# Patient Record
Sex: Male | Born: 1990
Health system: Southern US, Community
[De-identification: ages and names within clinical notes are randomized; demographics above are authoritative.]

## PROBLEM LIST (undated history)

## (undated) DIAGNOSIS — F1910 Other psychoactive substance abuse, uncomplicated: Secondary | ICD-10-CM

## (undated) DIAGNOSIS — E785 Hyperlipidemia, unspecified: Secondary | ICD-10-CM

## (undated) DIAGNOSIS — K219 Gastro-esophageal reflux disease without esophagitis: Secondary | ICD-10-CM

## (undated) DIAGNOSIS — N3090 Cystitis, unspecified without hematuria: Secondary | ICD-10-CM

## (undated) DIAGNOSIS — T7840XA Allergy, unspecified, initial encounter: Secondary | ICD-10-CM

## (undated) DIAGNOSIS — J45909 Unspecified asthma, uncomplicated: Secondary | ICD-10-CM

## (undated) DIAGNOSIS — K859 Acute pancreatitis without necrosis or infection, unspecified: Secondary | ICD-10-CM

## (undated) DIAGNOSIS — F1010 Alcohol abuse, uncomplicated: Secondary | ICD-10-CM

## (undated) DIAGNOSIS — L209 Atopic dermatitis, unspecified: Secondary | ICD-10-CM

## (undated) HISTORY — DX: Hyperlipidemia, unspecified: E78.5

## (undated) HISTORY — DX: Unspecified asthma, uncomplicated: J45.909

## (undated) HISTORY — DX: Gastro-esophageal reflux disease without esophagitis: K21.9

## (undated) HISTORY — DX: Atopic dermatitis, unspecified: L20.9

## (undated) HISTORY — DX: Allergy, unspecified, initial encounter: T78.40XA

---
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
## 2012-04-28 ENCOUNTER — Ambulatory Visit (INDEPENDENT_AMBULATORY_CARE_PROVIDER_SITE_OTHER): Payer: Federal, State, Local not specified - PPO | Admitting: Family Medicine

## 2012-04-28 VITALS — BP 128/84 | HR 64 | Temp 98.1°F | Resp 16 | Ht 69.5 in | Wt 143.6 lb

## 2012-04-28 DIAGNOSIS — L259 Unspecified contact dermatitis, unspecified cause: Secondary | ICD-10-CM

## 2012-04-28 DIAGNOSIS — L309 Dermatitis, unspecified: Secondary | ICD-10-CM

## 2012-04-28 MED ORDER — KETOCONAZOLE 2 % EX CREA: TOPICAL_CREAM | Freq: Every day | CUTANEOUS | Status: AC

## 2012-04-28 MED ORDER — DOXYCYCLINE HYCLATE 100 MG PO CAPS: 100.0000 mg | ORAL_CAPSULE | Freq: Two times a day (BID) | ORAL | Status: AC

## 2012-04-28 NOTE — Progress Notes (Signed)
  Subjective:    Patient ID: Charles Dougherty, male    DOB: [birth year], 21 y.o.   MRN: [id number]  HPI This 21 y.o. Male presents for itchy lesion x 1 week. He thinks it was a spider, but did not witness it, just noticed it one morning upon waking.  Has formed a ring, with discoloration in the center.  Still very itchy.  No pain.  Thinks it's getting worse with OTC calamine lotion and anti-itch cream.   Past Medical History  Diagnosis Date  . Allergy   . Asthma   . Atopic eczema     Prior to Admission medications   Medication Sig Start Date End Date Taking? Authorizing Provider  cetirizine (ZYRTEC ALLERGY) 10 MG tablet Take 10 mg by mouth daily.   Yes Historical Provider, MD   No Known Allergies  History   Social History  . Marital Status: Single    Spouse Name: N/A    Number of Children: 0  . Years of Education: N/A   Occupational History  . student     [employer]   Social History Main Topics  . Smoking status: Never Smoker   . Smokeless tobacco: Never Used  . Alcohol Use: No  . Drug Use: No  . Sexually Active: No   Family History  Problem Relation Age of Onset  . Heart disease Mother   . Hypertension Mother   . Nephrolithiasis Father   . GER disease Brother    Review of Systems As above. No fever, chills, GI/GU symptoms.    Objective:   Physical Exam  Constitutional: He is oriented to person, place, and time. Vital signs are normal. He appears well-developed and well-nourished. No distress.  Eyes: Conjunctivae are normal.  Pulmonary/Chest: Effort normal.  Neurological: He is alert and oriented to person, place, and time.  Skin: Skin is warm and dry. Rash noted. Rash is papular. There is erythema.     Psychiatric: He has a normal mood and affect.       Assessment & Plan:   1. Dermatitis  doxycycline (VIBRAMYCIN) 100 MG capsule, ketoconazole (NIZORAL) 2 % cream   Patient Instructions  The cream is to treat you for a  fungal infection, the oral medication for a possible bacterial infection.  If your symptoms worsen or persist, please return for re-evaluation.   Seen with Dr. Patsy Lager.

## 2012-04-28 NOTE — Patient Instructions (Signed)
The cream is to treat you for a fungal infection, the oral medication for a possible bacterial infection.  If your symptoms worsen or persist, please return for re-evaluation.

## 2016-04-01 DIAGNOSIS — K529 Noninfective gastroenteritis and colitis, unspecified: Secondary | ICD-10-CM | POA: Diagnosis not present

## 2016-04-01 DIAGNOSIS — J45909 Unspecified asthma, uncomplicated: Secondary | ICD-10-CM | POA: Diagnosis not present

## 2016-04-01 DIAGNOSIS — J209 Acute bronchitis, unspecified: Secondary | ICD-10-CM | POA: Diagnosis not present

## 2016-12-06 DIAGNOSIS — J111 Influenza due to unidentified influenza virus with other respiratory manifestations: Secondary | ICD-10-CM | POA: Diagnosis not present

## 2017-01-02 ENCOUNTER — Ambulatory Visit (INDEPENDENT_AMBULATORY_CARE_PROVIDER_SITE_OTHER): Payer: Federal, State, Local not specified - PPO

## 2017-01-02 ENCOUNTER — Ambulatory Visit (INDEPENDENT_AMBULATORY_CARE_PROVIDER_SITE_OTHER): Payer: Federal, State, Local not specified - PPO | Admitting: Physician Assistant

## 2017-01-02 VITALS — BP 116/90 | HR 68 | Temp 98.3°F | Resp 16 | Ht 69.0 in | Wt 157.0 lb

## 2017-01-02 DIAGNOSIS — T1490XA Injury, unspecified, initial encounter: Secondary | ICD-10-CM | POA: Diagnosis not present

## 2017-01-02 DIAGNOSIS — M25511 Pain in right shoulder: Secondary | ICD-10-CM

## 2017-01-02 MED ORDER — MELOXICAM 15 MG PO TABS: 15.0000 mg | ORAL_TABLET | Freq: Every day | ORAL | 0 refills | Status: DC

## 2017-01-02 NOTE — Patient Instructions (Addendum)
  Were your sling for one week during the waking hours.  Take meloxicam daily in the morning with food. Take tylneol 1000 mg every 8 as needed.  Do not take other pain meds.    IF you received an x-ray today, you will receive an invoice [facility]. Please contact [facility] at [phone] with questions or concerns regarding your invoice.   IF you received labwork today, you will receive an invoice from [facility]. Please contact [facility] at [phone] with questions or concerns regarding your invoice.   Our billing staff will not be able to assist you with questions regarding bills from these companies.  You will be contacted with the lab results as soon as they are available. The fastest way to get your results is to activate your My Chart account. Instructions are located on the last page of this paperwork. If you have not heard from us regarding the results in 2 weeks, please contact this office.

## 2017-01-02 NOTE — Progress Notes (Signed)
  01/02/2017 4:05 PM   DOB: 09/10/1991 / MRN: [id number]  SUBJECTIVE:  Charles Dougherty is a 26 y.o. male presenting for right shoulder pain that started after he fell off of his hammock. He points to the right supraspinatus when identifying where the pain is.  Tells me the pain radiates to the front of the shoulder. Denies weakness.  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 He associates some bruising.  He has tried Ibuprofen 600-800 every 12 hours with some relief of the pain. He is not getting better or worse.   He has No Known Allergies.   He  has a past medical history of Allergy; Asthma; and Atopic eczema.    He  reports that he has never smoked. He has never used smokeless tobacco. He reports that he does not drink alcohol or use drugs. He  reports that he does not engage in sexual activity. The patient  has no past surgical history on file.  His family history includes GER disease in his brother; Hyperlipidemia in his mother; Hypertension in his mother; Nephrolithiasis in his father.  Review of Systems  Musculoskeletal: Positive for falls, joint pain and myalgias. Negative for back pain and neck pain.  Neurological: Negative for focal weakness.    The problem list and medications were reviewed and updated by myself where necessary and exist elsewhere in the encounter.   OBJECTIVE:  BP 116/90   Pulse 68   Temp 98.3 F (36.8 C) (Oral)   Resp 16   Ht 5\' 9"  (1.753 m)   Wt 157 lb (71.2 kg)   SpO2 98%   BMI 23.18 kg/m   Physical Exam  Constitutional: Vital signs are normal.  Musculoskeletal:       Right shoulder: He exhibits tenderness (AC joint and supraspinatus tendon.), bony tenderness and pain. He exhibits normal range of motion, no swelling, no effusion, no deformity, no laceration, no spasm and normal strength.       Arms: Vitals reviewed.   No results found for this or any previous visit (from the past 72 hour(s)).  Dg Shoulder Right  Result  Date: 01/02/2017 CLINICAL DATA:  Recent fall, right shoulder pain and bruising EXAM: RIGHT SHOULDER - 2+ VIEW COMPARISON:  None available FINDINGS: There is no evidence of fracture or dislocation. There is no evidence of arthropathy or other focal bone abnormality. Soft tissues are unremarkable. IMPRESSION: Negative. Electronically Signed   By: Judie PetitM.  Shick M.D.   On: 01/02/2017 16:01    ASSESSMENT AND PLAN:  Charles CoderZachary was seen today for shoulder.  Diagnoses and all orders for this visit:  Soft tissue injury: Rads negative.  Sling for one week with Meloxicam daily then recheck. Back to work but he must wear his sling.    Acute pain of right shoulder -     DG Shoulder Right; Future -     meloxicam (MOBIC) 15 MG tablet; Take 1 tablet (15 mg total) by mouth daily.    The patient is advised to call or return to clinic if he does not see an improvement in symptoms, or to seek the care of the closest emergency department if he worsens with the above plan.   Deliah BostonMichael Shequila Neglia, MHS, PA-C [facility] 01/02/2017 4:05 PM

## 2017-01-08 ENCOUNTER — Encounter: Payer: Self-pay | Admitting: Physician Assistant

## 2017-01-08 ENCOUNTER — Ambulatory Visit (INDEPENDENT_AMBULATORY_CARE_PROVIDER_SITE_OTHER): Payer: Federal, State, Local not specified - PPO | Admitting: Physician Assistant

## 2017-01-08 VITALS — BP 137/90 | HR 109 | Temp 99.1°F | Resp 16 | Ht 69.0 in | Wt 154.4 lb

## 2017-01-08 DIAGNOSIS — M25511 Pain in right shoulder: Secondary | ICD-10-CM | POA: Diagnosis not present

## 2017-01-08 DIAGNOSIS — T1490XA Injury, unspecified, initial encounter: Secondary | ICD-10-CM

## 2017-01-08 NOTE — Progress Notes (Signed)
  01/08/2017 2:46 PM   DOB: 11/29/1990 / MRN: [id number]  SUBJECTIVE:  Charles Dougherty is a 26 y.o. male presenting for recheck of left arm. Tells me it is 50% percent better.  Less pain with shoulder flexion.  Has been wearing the sling and taking meloxicam and will take Ibuprofen 400 mg.  Tells me his work is cooperating with his injury.   He has No Known Allergies.   He  has a past medical history of Allergy; Asthma; and Atopic eczema.    He  reports that he has never smoked. He has never used smokeless tobacco. He reports that he does not drink alcohol or use drugs. He  reports that he does not engage in sexual activity. The patient  has no past surgical history on file.  His family history includes GER disease in his brother; Hyperlipidemia in his mother; Hypertension in his mother; Nephrolithiasis in his father.  Review of Systems  Constitutional: Negative for chills and fever.    The problem list and medications were reviewed and updated by myself where necessary and exist elsewhere in the encounter.   OBJECTIVE:  BP 137/90 (BP Location: Right Arm, Patient Position: Sitting, Cuff Size: Small)   Pulse (!) 109   Temp 99.1 F (37.3 C) (Oral)   Resp 16   Ht 5\' 9"  (1.753 m)   Wt 154 lb 6.4 oz (70 kg)   SpO2 98%   BMI 22.80 kg/m   BP Readings from Last 3 Encounters:  01/08/17 137/90  01/02/17 116/90  04/28/12 128/84     Physical Exam  Cardiovascular: Normal rate and regular rhythm.   Rate 94 on recheck.   Pulmonary/Chest: Effort normal and breath sounds normal.  Musculoskeletal: Normal range of motion. He exhibits no edema, tenderness or deformity.       Right shoulder: He exhibits pain. He exhibits normal range of motion, no tenderness, no bony tenderness, no swelling, no effusion, no crepitus, no deformity, no laceration, no spasm, normal pulse and normal strength.       Arms:   No results found for this or any previous visit (from the past 72 hour(s)).  No results  found.  ASSESSMENT AND PLAN:  Charles Dougherty was seen today for follow-up.  Diagnoses and all orders for this visit:  Soft tissue injury: Old problem. He will pursue gentle ROM exercise for the next week.  Continue sling at work. Continue meloxicam or ibuprofen.  He will come back in a week if not closer to 75-85% improved.  No need to come back if improved to that point of past that point.   Acute pain of right shoulder: See problem 1.     The patient is advised to call or return to clinic if he does not see an improvement in symptoms, or to seek the care of the closest emergency department if he worsens with the above plan.   Deliah BostonMichael Shahara Hartsfield, MHS, PA-C [facility] 01/08/2017 2:46 PM

## 2017-01-08 NOTE — Patient Instructions (Addendum)
     IF you received an x-ray today, you will receive an invoice from [facility]. Please contact [facility] at [phone] with questions or concerns regarding your invoice.   IF you received labwork today, you will receive an invoice from [facility]. Please contact [facility] at [phone] with questions or concerns regarding your invoice.   Our billing staff will not be able to assist you with questions regarding bills from these companies.  You will be contacted with the lab results as soon as they are available. The fastest way to get your results is to activate your My Chart account. Instructions are located on the last page of this paperwork. If you have not heard from us regarding the results in 2 weeks, please contact this office.     

## 2017-01-09 ENCOUNTER — Ambulatory Visit: Payer: Federal, State, Local not specified - PPO

## 2017-11-22 DIAGNOSIS — J069 Acute upper respiratory infection, unspecified: Secondary | ICD-10-CM | POA: Diagnosis not present

## 2018-02-13 DIAGNOSIS — L03115 Cellulitis of right lower limb: Secondary | ICD-10-CM | POA: Diagnosis not present

## 2018-02-13 DIAGNOSIS — S70361A Insect bite (nonvenomous), right thigh, initial encounter: Secondary | ICD-10-CM | POA: Diagnosis not present

## 2018-08-13 ENCOUNTER — Ambulatory Visit: Payer: Self-pay | Admitting: Adult Health

## 2018-12-27 ENCOUNTER — Emergency Department (HOSPITAL_BASED_OUTPATIENT_CLINIC_OR_DEPARTMENT_OTHER): Payer: Federal, State, Local not specified - PPO

## 2018-12-27 ENCOUNTER — Encounter (HOSPITAL_BASED_OUTPATIENT_CLINIC_OR_DEPARTMENT_OTHER): Payer: Self-pay

## 2018-12-27 ENCOUNTER — Other Ambulatory Visit: Payer: Self-pay

## 2018-12-27 ENCOUNTER — Inpatient Hospital Stay (HOSPITAL_BASED_OUTPATIENT_CLINIC_OR_DEPARTMENT_OTHER)
Admission: EM | Admit: 2018-12-27 | Discharge: 2019-01-19 | DRG: 438 | Disposition: A | Discharge status: Home / Self Care | Payer: Federal, State, Local not specified - PPO | Attending: Family Medicine | Admitting: Family Medicine

## 2018-12-27 DIAGNOSIS — R0902 Hypoxemia: Secondary | ICD-10-CM | POA: Diagnosis not present

## 2018-12-27 DIAGNOSIS — R748 Abnormal levels of other serum enzymes: Secondary | ICD-10-CM

## 2018-12-27 DIAGNOSIS — K8591 Acute pancreatitis with uninfected necrosis, unspecified: Secondary | ICD-10-CM | POA: Clinically undetermined

## 2018-12-27 DIAGNOSIS — E781 Pure hyperglyceridemia: Secondary | ICD-10-CM | POA: Diagnosis not present

## 2018-12-27 DIAGNOSIS — E875 Hyperkalemia: Secondary | ICD-10-CM | POA: Diagnosis not present

## 2018-12-27 DIAGNOSIS — D72829 Elevated white blood cell count, unspecified: Secondary | ICD-10-CM

## 2018-12-27 DIAGNOSIS — E876 Hypokalemia: Secondary | ICD-10-CM | POA: Diagnosis not present

## 2018-12-27 DIAGNOSIS — D638 Anemia in other chronic diseases classified elsewhere: Secondary | ICD-10-CM | POA: Diagnosis present

## 2018-12-27 DIAGNOSIS — R0600 Dyspnea, unspecified: Secondary | ICD-10-CM

## 2018-12-27 DIAGNOSIS — Z9889 Other specified postprocedural states: Secondary | ICD-10-CM

## 2018-12-27 DIAGNOSIS — D649 Anemia, unspecified: Secondary | ICD-10-CM | POA: Diagnosis not present

## 2018-12-27 DIAGNOSIS — F17210 Nicotine dependence, cigarettes, uncomplicated: Secondary | ICD-10-CM | POA: Diagnosis present

## 2018-12-27 DIAGNOSIS — J189 Pneumonia, unspecified organism: Secondary | ICD-10-CM | POA: Diagnosis not present

## 2018-12-27 DIAGNOSIS — K852 Alcohol induced acute pancreatitis without necrosis or infection: Secondary | ICD-10-CM | POA: Diagnosis not present

## 2018-12-27 DIAGNOSIS — Z791 Long term (current) use of non-steroidal anti-inflammatories (NSAID): Secondary | ICD-10-CM

## 2018-12-27 DIAGNOSIS — I9589 Other hypotension: Secondary | ICD-10-CM | POA: Diagnosis not present

## 2018-12-27 DIAGNOSIS — R1011 Right upper quadrant pain: Secondary | ICD-10-CM

## 2018-12-27 DIAGNOSIS — E871 Hypo-osmolality and hyponatremia: Secondary | ICD-10-CM | POA: Diagnosis not present

## 2018-12-27 DIAGNOSIS — R197 Diarrhea, unspecified: Secondary | ICD-10-CM | POA: Diagnosis not present

## 2018-12-27 DIAGNOSIS — R0602 Shortness of breath: Secondary | ICD-10-CM

## 2018-12-27 DIAGNOSIS — K760 Fatty (change of) liver, not elsewhere classified: Secondary | ICD-10-CM | POA: Diagnosis present

## 2018-12-27 DIAGNOSIS — F1910 Other psychoactive substance abuse, uncomplicated: Secondary | ICD-10-CM | POA: Diagnosis present

## 2018-12-27 DIAGNOSIS — G4700 Insomnia, unspecified: Secondary | ICD-10-CM | POA: Diagnosis not present

## 2018-12-27 DIAGNOSIS — J181 Lobar pneumonia, unspecified organism: Secondary | ICD-10-CM | POA: Diagnosis not present

## 2018-12-27 DIAGNOSIS — I10 Essential (primary) hypertension: Secondary | ICD-10-CM | POA: Diagnosis present

## 2018-12-27 DIAGNOSIS — K56609 Unspecified intestinal obstruction, unspecified as to partial versus complete obstruction: Secondary | ICD-10-CM | POA: Diagnosis not present

## 2018-12-27 DIAGNOSIS — K567 Ileus, unspecified: Secondary | ICD-10-CM | POA: Diagnosis not present

## 2018-12-27 DIAGNOSIS — J90 Pleural effusion, not elsewhere classified: Secondary | ICD-10-CM | POA: Diagnosis not present

## 2018-12-27 DIAGNOSIS — J45909 Unspecified asthma, uncomplicated: Secondary | ICD-10-CM | POA: Diagnosis present

## 2018-12-27 DIAGNOSIS — R509 Fever, unspecified: Secondary | ICD-10-CM | POA: Diagnosis not present

## 2018-12-27 DIAGNOSIS — Z452 Encounter for adjustment and management of vascular access device: Secondary | ICD-10-CM

## 2018-12-27 DIAGNOSIS — N3090 Cystitis, unspecified without hematuria: Secondary | ICD-10-CM | POA: Diagnosis present

## 2018-12-27 DIAGNOSIS — K625 Hemorrhage of anus and rectum: Secondary | ICD-10-CM | POA: Diagnosis not present

## 2018-12-27 DIAGNOSIS — F1210 Cannabis abuse, uncomplicated: Secondary | ICD-10-CM | POA: Diagnosis present

## 2018-12-27 DIAGNOSIS — R1010 Upper abdominal pain, unspecified: Secondary | ICD-10-CM | POA: Diagnosis not present

## 2018-12-27 DIAGNOSIS — R109 Unspecified abdominal pain: Secondary | ICD-10-CM

## 2018-12-27 DIAGNOSIS — K921 Melena: Secondary | ICD-10-CM | POA: Diagnosis not present

## 2018-12-27 DIAGNOSIS — E162 Hypoglycemia, unspecified: Secondary | ICD-10-CM | POA: Diagnosis not present

## 2018-12-27 DIAGNOSIS — K8521 Alcohol induced acute pancreatitis with uninfected necrosis: Principal | ICD-10-CM | POA: Diagnosis present

## 2018-12-27 DIAGNOSIS — R112 Nausea with vomiting, unspecified: Secondary | ICD-10-CM | POA: Diagnosis not present

## 2018-12-27 DIAGNOSIS — N179 Acute kidney failure, unspecified: Secondary | ICD-10-CM | POA: Diagnosis not present

## 2018-12-27 DIAGNOSIS — K8581 Other acute pancreatitis with uninfected necrosis: Secondary | ICD-10-CM | POA: Diagnosis present

## 2018-12-27 DIAGNOSIS — Z79899 Other long term (current) drug therapy: Secondary | ICD-10-CM

## 2018-12-27 DIAGNOSIS — R1084 Generalized abdominal pain: Secondary | ICD-10-CM | POA: Diagnosis not present

## 2018-12-27 DIAGNOSIS — K859 Acute pancreatitis without necrosis or infection, unspecified: Secondary | ICD-10-CM

## 2018-12-27 DIAGNOSIS — R1110 Vomiting, unspecified: Secondary | ICD-10-CM | POA: Diagnosis not present

## 2018-12-27 DIAGNOSIS — Z8349 Family history of other endocrine, nutritional and metabolic diseases: Secondary | ICD-10-CM

## 2018-12-27 DIAGNOSIS — F1010 Alcohol abuse, uncomplicated: Secondary | ICD-10-CM | POA: Diagnosis not present

## 2018-12-27 DIAGNOSIS — Z8249 Family history of ischemic heart disease and other diseases of the circulatory system: Secondary | ICD-10-CM

## 2018-12-27 DIAGNOSIS — Z978 Presence of other specified devices: Secondary | ICD-10-CM

## 2018-12-27 DIAGNOSIS — R079 Chest pain, unspecified: Secondary | ICD-10-CM

## 2018-12-27 DIAGNOSIS — K644 Residual hemorrhoidal skin tags: Secondary | ICD-10-CM | POA: Diagnosis present

## 2018-12-27 DIAGNOSIS — Z0189 Encounter for other specified special examinations: Secondary | ICD-10-CM

## 2018-12-27 DIAGNOSIS — R21 Rash and other nonspecific skin eruption: Secondary | ICD-10-CM | POA: Diagnosis not present

## 2018-12-27 DIAGNOSIS — Z91013 Allergy to seafood: Secondary | ICD-10-CM

## 2018-12-27 DIAGNOSIS — R140 Abdominal distension (gaseous): Secondary | ICD-10-CM

## 2018-12-27 DIAGNOSIS — E46 Unspecified protein-calorie malnutrition: Secondary | ICD-10-CM | POA: Diagnosis not present

## 2018-12-27 DIAGNOSIS — D6959 Other secondary thrombocytopenia: Secondary | ICD-10-CM | POA: Diagnosis present

## 2018-12-27 DIAGNOSIS — Z6822 Body mass index (BMI) 22.0-22.9, adult: Secondary | ICD-10-CM

## 2018-12-27 HISTORY — DX: Other psychoactive substance abuse, uncomplicated: F19.10

## 2018-12-27 HISTORY — DX: Cystitis, unspecified without hematuria: N30.90

## 2018-12-27 HISTORY — DX: Alcohol abuse, uncomplicated: F10.10

## 2018-12-27 LAB — URINALYSIS, ROUTINE W REFLEX MICROSCOPIC
Bilirubin Urine: NEGATIVE
Glucose, UA: NEGATIVE mg/dL
Hgb urine dipstick: NEGATIVE
KETONES UR: NEGATIVE mg/dL
Leukocytes,Ua: NEGATIVE
Nitrite: NEGATIVE
Protein, ur: NEGATIVE mg/dL
Specific Gravity, Urine: 1.01 (ref 1.005–1.030)
pH: 6 (ref 5.0–8.0)

## 2018-12-27 LAB — CBC
HCT: 39.6 % (ref 39.0–52.0)
Hemoglobin: 14.4 g/dL (ref 13.0–17.0)
MCH: 35 pg — ABNORMAL HIGH (ref 26.0–34.0)
MCHC: 36.4 g/dL — ABNORMAL HIGH (ref 30.0–36.0)
MCV: 96.1 fL (ref 80.0–100.0)
Platelets: 103 10*3/uL — ABNORMAL LOW (ref 150–400)
RBC: 4.12 MIL/uL — ABNORMAL LOW (ref 4.22–5.81)
RDW: 13.2 % (ref 11.5–15.5)
WBC: 10.2 10*3/uL (ref 4.0–10.5)
nRBC: 0 % (ref 0.0–0.2)

## 2018-12-27 MED ORDER — ONDANSETRON HCL 4 MG/2ML IJ SOLN
4.0000 mg | Freq: Once | INTRAMUSCULAR | Status: AC
  Administered 2018-12-27: 4 mg via INTRAVENOUS
  Filled 2018-12-27: qty 2

## 2018-12-27 MED ORDER — ALUM & MAG HYDROXIDE-SIMETH 200-200-20 MG/5ML PO SUSP
30.0000 mL | Freq: Once | ORAL | Status: AC
  Administered 2018-12-27: 30 mL via ORAL
  Filled 2018-12-27: qty 30

## 2018-12-27 MED ORDER — SODIUM CHLORIDE 0.9 % IV BOLUS
1000.0000 mL | Freq: Once | INTRAVENOUS | Status: AC
  Administered 2018-12-27: 1000 mL via INTRAVENOUS

## 2018-12-27 MED ORDER — HYDROMORPHONE HCL 1 MG/ML IJ SOLN
1.0000 mg | Freq: Once | INTRAMUSCULAR | Status: AC
  Administered 2018-12-27: 1 mg via INTRAVENOUS
  Filled 2018-12-27: qty 1

## 2018-12-27 MED ORDER — LIDOCAINE VISCOUS HCL 2 % MT SOLN
15.0000 mL | Freq: Once | OROMUCOSAL | Status: AC
  Administered 2018-12-27: 15 mL via ORAL
  Filled 2018-12-27: qty 15

## 2018-12-27 MED ORDER — MORPHINE SULFATE (PF) 4 MG/ML IV SOLN
4.0000 mg | Freq: Once | INTRAVENOUS | Status: AC
  Administered 2018-12-27: 4 mg via INTRAVENOUS
  Filled 2018-12-27: qty 1

## 2018-12-27 MED ORDER — IOHEXOL 300 MG/ML  SOLN
100.0000 mL | Freq: Once | INTRAMUSCULAR | Status: AC | PRN
  Administered 2018-12-27: 100 mL via INTRAVENOUS

## 2018-12-27 NOTE — ED Notes (Signed)
ED Provider at bedside. 

## 2018-12-27 NOTE — ED Notes (Signed)
Pt yelling in pain

## 2018-12-27 NOTE — ED Provider Notes (Signed)
[facility] EMERGENCY DEPARTMENT Provider Note   CSN: [id number] Arrival date & time: 12/27/18  2132  History   Chief Complaint Chief Complaint  Patient presents with  . Abdominal Pain   HPI Charles Dougherty is a 28 y.o. male with no significant past medical history who presents for evaluation of abdominal pain.  Patient states he has had epigastric and right upper quadrant abdominal pain x months. Worse over the last 2 days.  Patient states he has had multiple episodes of nonbloody, nonbilious emesis.  Has not taken anything for his symptoms.  Denies NSAID use, tobacco, alcohol use.  Patient states symptoms worse after eating.  Patient states he is also had some bright red blood with wiping with rectal movements.  Patient has known history of external hemorrhoids.  States this is consistent.  Rates his pain an 8/10.  Pain does not radiate.  Denies any additional aggravating or alleviating factors.  Denies fever, chills, chest pain, diarrhea, dysuria.  She states he has felt short of breath when he gets "waves of pain." Denies tobacco or alcohol use.  History obtained from patient and mother in room.  No interpreter was used.    HPI  Past Medical History:  Diagnosis Date  . Allergy   . Asthma   . Atopic eczema     There are no active problems to display for this patient.   History reviewed. No pertinent surgical history.      Home Medications    Prior to Admission medications   Medication Sig Start Date End Date Taking? Authorizing Provider  cetirizine (ZYRTEC ALLERGY) 10 MG tablet Take 10 mg by mouth daily.    [provider]  meloxicam (MOBIC) 15 MG tablet Take 1 tablet (15 mg total) by mouth daily. 01/02/17   Ofilia Neas, PA-C    Family History Family History  Problem Relation Age of Onset  . Hypertension Mother   . Hyperlipidemia Mother   . Nephrolithiasis Father   . GER disease Brother     Social History Social History   Tobacco Use  .  Smoking status: Current Every Day Smoker    Types: Cigarettes  . Smokeless tobacco: Never Used  Substance Use Topics  . Alcohol use: Yes    Comment: weekly  . Drug use: Yes    Types: Marijuana     Allergies   Patient has no known allergies.   Review of Systems Review of Systems  Constitutional: Negative.   HENT: Negative.   Eyes: Negative.   Respiratory: Positive for shortness of breath. Negative for apnea, cough, choking, chest tightness, wheezing and stridor.   Gastrointestinal: Positive for abdominal pain, blood in stool, nausea and vomiting. Negative for abdominal distention, anal bleeding, constipation, diarrhea and rectal pain.  Genitourinary: Negative.   Musculoskeletal: Negative.   Skin: Negative.   Neurological: Negative.   All other systems reviewed and are negative.    Physical Exam Updated Vital Signs BP (!) 142/81   Pulse 84   Temp 97.8 F (36.6 C) (Oral)   Resp (!) 22   Ht  (1.778 m)   Wt 70.3 kg   SpO2 100%   BMI 22.24 kg/m   Physical Exam Vitals signs and nursing note reviewed.  Constitutional:      General: He is not in acute distress.    Appearance: He is well-developed. He is not ill-appearing, toxic-appearing or diaphoretic.  HENT:     Head: Normocephalic and atraumatic.  Mouth/Throat:     Comments: Mucous membranes moist. Eyes:     Pupils: Pupils are equal, round, and reactive to light.  Neck:     Musculoskeletal: Normal range of motion and neck supple.  Cardiovascular:     Rate and Rhythm: Normal rate and regular rhythm.     Heart sounds: Normal heart sounds.  Pulmonary:     Effort: Pulmonary effort is normal. No respiratory distress.     Comments: Clear to auscultation bilateral without wheeze, rhonchi or rales.  No accessory muscle usage.  Able speak in full sentences without difficulty. Abdominal:     General: There is no distension.     Palpations: Abdomen is soft.     Comments: Soft without rebound. Guarding however  distractible in nature.  Diffusely tender, more so in epigastric and right upper quadrant.  No CVA tenderness.  Musculoskeletal: Normal range of motion.     Comments: Moves all 4 extremities without difficulty.  Ambulatory in department that difficulty.  Skin:    General: Skin is warm and dry.     Comments: No rashes or lesions.  Brisk capillary refill.  Neurological:     Mental Status: He is alert.      ED Treatments / Results  Labs (all labs ordered are listed, but only abnormal results are displayed) Labs Reviewed  LIPASE, BLOOD - Abnormal; Notable for the following components:      Result Value   Lipase 209 (*)    All other components within normal limits  CBC - Abnormal; Notable for the following components:   RBC 4.12 (*)    MCH 35.0 (*)    MCHC 36.4 (*)    Platelets 103 (*)    All other components within normal limits  URINALYSIS, ROUTINE W REFLEX MICROSCOPIC  COMPREHENSIVE METABOLIC PANEL    EKG None  Radiology US Abdomen Limited Ruq  Result Date: 12/27/2018 CLINICAL DATA:  28 year old male with right upper quadrant abdominal pain for 2 months, but progressed in the past 2 days. EXAM: ULTRASOUND ABDOMEN LIMITED RIGHT UPPER QUADRANT COMPARISON:  None. FINDINGS: Gallbladder: No gallstones or wall thickening visualized. No sonographic Murphy sign noted by sonographer. Common bile duct: Diameter: 3 millimeters, normal. Liver: Echogenic liver (image 15). No discrete liver lesion. No intrahepatic biliary ductal dilatation. Portal vein is patent on color Doppler imaging with normal direction of blood flow towards the liver. Other findings: Negative visible right kidney. IMPRESSION: 1. Hepatic steatosis. 2. Negative gallbladder.  No evidence of biliary obstruction. Electronically Signed   By: Odessa Fleming M.D.   On: 12/27/2018 23:26   Procedures Procedures (including critical care time)  Medications Ordered in ED Medications  sodium chloride 0.9 % bolus 1,000 mL (1,000 mLs  Intravenous New Bag/Given 12/27/18 2255)  ondansetron (ZOFRAN) injection 4 mg (4 mg Intravenous Given 12/27/18 2254)  morphine 4 MG/ML injection 4 mg (4 mg Intravenous Given 12/27/18 2254)  alum & mag hydroxide-simeth (MAALOX/MYLANTA) 200-200-20 MG/5ML suspension 30 mL (30 mLs Oral Given 12/27/18 2347)    And  lidocaine (XYLOCAINE) 2 % viscous mouth solution 15 mL (15 mLs Oral Given 12/27/18 2347)  HYDROmorphone (DILAUDID) injection 1 mg (1 mg Intravenous Given 12/27/18 2347)  iohexol (OMNIPAQUE) 300 MG/ML solution 100 mL (100 mLs Intravenous Contrast Given 12/27/18 2350)   Initial Impression / Assessment and Plan / ED Course  I have reviewed the triage vital signs and the nursing notes.  Pertinent labs & imaging results that were available during my care of the  patient were reviewed by me and considered in my medical decision making (see chart for details).  28 year old male appears otherwise well presents for evaluation of abdominal pain.  Onset 2 months ago, worsened over the last 3 days.  Afebrile, nonseptic, non-ill-appearing.  Patient with diffusely tender abdomen, worse in epigastric and right upper quadrant.  Mucous membranes moist.  Lungs clear to auscultation bilaterally without wheeze, rhonchi or rales.  No tachypnea, tachycardia or hypoxia.  Will obtain labs, imaging and reevaluate. Occult positive at [facility] with hemorrhoids. Discussed repeating this. Declines repeat GU exam at this time. I cannot see his results from [facility] minute clinic in Epic.  2330: CBC without leukocytosis, urinalysis negative, metabolic panel lipase pending.  I was notified me that blood is extremely lipophilic.  Will take additional time for metabolic panel to result.  Ultrasound abdomen without any evidence of gallbladder pathology. Lipase 209.  Patient pending CMP and CT at care transfer Care transferred to Dr. Manus Gunning who will determine ultimate treatment, plan and disposition.     Final Clinical Impressions(s) / ED  Diagnoses   Final diagnoses:  RUQ pain    ED Discharge Orders    None       Maytal Mijangos A, PA-C 12/28/18 0019    Glynn Octave, MD 12/28/18 0139

## 2018-12-27 NOTE — ED Triage Notes (Signed)
C/o abd pain x 2 months-dizziness, SOB started yesterday-blood in stool x today-seen at [facility]-states pain has increased and he was unable to get outpt lab done before closing today-NAD-steady gait-mother with pt

## 2018-12-27 NOTE — ED Notes (Signed)
PT writhing in pain, states pain is sharp now.

## 2018-12-27 NOTE — ED Notes (Signed)
Patient transported to CT 

## 2018-12-27 NOTE — ED Notes (Signed)
Patient transported to Ultrasound 

## 2018-12-28 ENCOUNTER — Encounter (HOSPITAL_COMMUNITY): Payer: Self-pay | Admitting: *Deleted

## 2018-12-28 ENCOUNTER — Other Ambulatory Visit: Payer: Self-pay

## 2018-12-28 DIAGNOSIS — Z9889 Other specified postprocedural states: Secondary | ICD-10-CM | POA: Diagnosis not present

## 2018-12-28 DIAGNOSIS — K921 Melena: Secondary | ICD-10-CM | POA: Diagnosis present

## 2018-12-28 DIAGNOSIS — R1110 Vomiting, unspecified: Secondary | ICD-10-CM | POA: Diagnosis not present

## 2018-12-28 DIAGNOSIS — K863 Pseudocyst of pancreas: Secondary | ICD-10-CM | POA: Diagnosis not present

## 2018-12-28 DIAGNOSIS — R1010 Upper abdominal pain, unspecified: Secondary | ICD-10-CM | POA: Diagnosis not present

## 2018-12-28 DIAGNOSIS — K852 Alcohol induced acute pancreatitis without necrosis or infection: Secondary | ICD-10-CM | POA: Diagnosis present

## 2018-12-28 DIAGNOSIS — R918 Other nonspecific abnormal finding of lung field: Secondary | ICD-10-CM | POA: Diagnosis not present

## 2018-12-28 DIAGNOSIS — K8592 Acute pancreatitis with infected necrosis, unspecified: Secondary | ICD-10-CM | POA: Diagnosis not present

## 2018-12-28 DIAGNOSIS — Z4682 Encounter for fitting and adjustment of non-vascular catheter: Secondary | ICD-10-CM | POA: Diagnosis not present

## 2018-12-28 DIAGNOSIS — E876 Hypokalemia: Secondary | ICD-10-CM | POA: Diagnosis not present

## 2018-12-28 DIAGNOSIS — J90 Pleural effusion, not elsewhere classified: Secondary | ICD-10-CM | POA: Diagnosis not present

## 2018-12-28 DIAGNOSIS — N3090 Cystitis, unspecified without hematuria: Secondary | ICD-10-CM

## 2018-12-28 DIAGNOSIS — R140 Abdominal distension (gaseous): Secondary | ICD-10-CM | POA: Diagnosis not present

## 2018-12-28 DIAGNOSIS — F1910 Other psychoactive substance abuse, uncomplicated: Secondary | ICD-10-CM | POA: Diagnosis not present

## 2018-12-28 DIAGNOSIS — J948 Other specified pleural conditions: Secondary | ICD-10-CM | POA: Diagnosis not present

## 2018-12-28 DIAGNOSIS — K859 Acute pancreatitis without necrosis or infection, unspecified: Secondary | ICD-10-CM | POA: Diagnosis not present

## 2018-12-28 DIAGNOSIS — K56609 Unspecified intestinal obstruction, unspecified as to partial versus complete obstruction: Secondary | ICD-10-CM | POA: Diagnosis not present

## 2018-12-28 DIAGNOSIS — J45909 Unspecified asthma, uncomplicated: Secondary | ICD-10-CM | POA: Diagnosis present

## 2018-12-28 DIAGNOSIS — N179 Acute kidney failure, unspecified: Secondary | ICD-10-CM | POA: Diagnosis not present

## 2018-12-28 DIAGNOSIS — J189 Pneumonia, unspecified organism: Secondary | ICD-10-CM | POA: Diagnosis not present

## 2018-12-28 DIAGNOSIS — K56699 Other intestinal obstruction unspecified as to partial versus complete obstruction: Secondary | ICD-10-CM | POA: Diagnosis not present

## 2018-12-28 DIAGNOSIS — E781 Pure hyperglyceridemia: Secondary | ICD-10-CM | POA: Diagnosis not present

## 2018-12-28 DIAGNOSIS — I9589 Other hypotension: Secondary | ICD-10-CM | POA: Diagnosis not present

## 2018-12-28 DIAGNOSIS — F1010 Alcohol abuse, uncomplicated: Secondary | ICD-10-CM | POA: Diagnosis not present

## 2018-12-28 DIAGNOSIS — R748 Abnormal levels of other serum enzymes: Secondary | ICD-10-CM | POA: Diagnosis not present

## 2018-12-28 DIAGNOSIS — K8521 Alcohol induced acute pancreatitis with uninfected necrosis: Secondary | ICD-10-CM | POA: Diagnosis present

## 2018-12-28 DIAGNOSIS — E871 Hypo-osmolality and hyponatremia: Secondary | ICD-10-CM | POA: Diagnosis not present

## 2018-12-28 DIAGNOSIS — D638 Anemia in other chronic diseases classified elsewhere: Secondary | ICD-10-CM | POA: Diagnosis present

## 2018-12-28 DIAGNOSIS — K760 Fatty (change of) liver, not elsewhere classified: Secondary | ICD-10-CM | POA: Diagnosis present

## 2018-12-28 DIAGNOSIS — R1011 Right upper quadrant pain: Secondary | ICD-10-CM | POA: Diagnosis present

## 2018-12-28 DIAGNOSIS — R1084 Generalized abdominal pain: Secondary | ICD-10-CM | POA: Diagnosis not present

## 2018-12-28 DIAGNOSIS — E46 Unspecified protein-calorie malnutrition: Secondary | ICD-10-CM | POA: Diagnosis not present

## 2018-12-28 DIAGNOSIS — R197 Diarrhea, unspecified: Secondary | ICD-10-CM | POA: Diagnosis not present

## 2018-12-28 DIAGNOSIS — J918 Pleural effusion in other conditions classified elsewhere: Secondary | ICD-10-CM | POA: Diagnosis not present

## 2018-12-28 DIAGNOSIS — K8581 Other acute pancreatitis with uninfected necrosis: Secondary | ICD-10-CM | POA: Diagnosis present

## 2018-12-28 DIAGNOSIS — K8591 Acute pancreatitis with uninfected necrosis, unspecified: Secondary | ICD-10-CM | POA: Diagnosis not present

## 2018-12-28 DIAGNOSIS — R079 Chest pain, unspecified: Secondary | ICD-10-CM | POA: Diagnosis not present

## 2018-12-28 DIAGNOSIS — D649 Anemia, unspecified: Secondary | ICD-10-CM | POA: Diagnosis not present

## 2018-12-28 DIAGNOSIS — D6959 Other secondary thrombocytopenia: Secondary | ICD-10-CM | POA: Diagnosis present

## 2018-12-28 DIAGNOSIS — E875 Hyperkalemia: Secondary | ICD-10-CM | POA: Diagnosis not present

## 2018-12-28 DIAGNOSIS — J9811 Atelectasis: Secondary | ICD-10-CM | POA: Diagnosis not present

## 2018-12-28 DIAGNOSIS — Z4659 Encounter for fitting and adjustment of other gastrointestinal appliance and device: Secondary | ICD-10-CM | POA: Diagnosis not present

## 2018-12-28 DIAGNOSIS — K567 Ileus, unspecified: Secondary | ICD-10-CM | POA: Diagnosis not present

## 2018-12-28 DIAGNOSIS — F17210 Nicotine dependence, cigarettes, uncomplicated: Secondary | ICD-10-CM | POA: Diagnosis present

## 2018-12-28 DIAGNOSIS — Z452 Encounter for adjustment and management of vascular access device: Secondary | ICD-10-CM | POA: Diagnosis not present

## 2018-12-28 HISTORY — DX: Cystitis, unspecified without hematuria: N30.90

## 2018-12-28 HISTORY — DX: Alcohol abuse, uncomplicated: F10.10

## 2018-12-28 HISTORY — DX: Other psychoactive substance abuse, uncomplicated: F19.10

## 2018-12-28 LAB — COMPREHENSIVE METABOLIC PANEL
ALT: 117 U/L — ABNORMAL HIGH (ref 0–44)
AST: 105 U/L — ABNORMAL HIGH (ref 15–41)
Albumin: 3.2 g/dL — ABNORMAL LOW (ref 3.5–5.0)
Alkaline Phosphatase: 78 U/L (ref 38–126)
Anion gap: 12 (ref 5–15)
BUN: 6 mg/dL (ref 6–20)
CO2: 16 mmol/L — AB (ref 22–32)
Calcium: 7 mg/dL — ABNORMAL LOW (ref 8.9–10.3)
Chloride: 109 mmol/L (ref 98–111)
Creatinine, Ser: 1.1 mg/dL (ref 0.61–1.24)
GFR calc Af Amer: >60 mL/min (ref >60)
GFR calc non Af Amer: >60 mL/min (ref >60)
Glucose, Bld: 108 mg/dL — ABNORMAL HIGH (ref 70–99)
Potassium: 3.5 mmol/L (ref 3.5–5.1)
SODIUM: 137 mmol/L (ref 135–145)
Total Bilirubin: 1.9 mg/dL — ABNORMAL HIGH (ref 0.3–1.2)
Total Protein: 5.5 g/dL — ABNORMAL LOW (ref 6.5–8.1)

## 2018-12-28 LAB — RAPID URINE DRUG SCREEN, HOSP PERFORMED
Amphetamines: POSITIVE — AB
Barbiturates: NOT DETECTED
Benzodiazepines: NOT DETECTED
Cocaine: NOT DETECTED
Opiates: POSITIVE — AB
Tetrahydrocannabinol: POSITIVE — AB

## 2018-12-28 LAB — COMPREHENSIVE METABOLIC PANEL WITH GFR
ALT: 150 U/L — ABNORMAL HIGH (ref 0–44)
AST: 158 U/L — ABNORMAL HIGH (ref 15–41)
Albumin: 3.8 g/dL (ref 3.5–5.0)
Alkaline Phosphatase: 97 U/L (ref 38–126)
Anion gap: 16 — ABNORMAL HIGH (ref 5–15)
BUN: 14 mg/dL (ref 6–20)
CO2: 18 mmol/L — ABNORMAL LOW (ref 22–32)
Calcium: 8.9 mg/dL (ref 8.9–10.3)
Chloride: 99 mmol/L (ref 98–111)
Creatinine, Ser: 1.34 mg/dL — ABNORMAL HIGH (ref 0.61–1.24)
GFR calc Af Amer: >60 mL/min
GFR calc non Af Amer: >60 mL/min
Glucose, Bld: 99 mg/dL (ref 70–99)
Potassium: 3.5 mmol/L (ref 3.5–5.1)
Sodium: 133 mmol/L — ABNORMAL LOW (ref 135–145)
Total Bilirubin: 1.1 mg/dL (ref 0.3–1.2)
Total Protein: 6.1 g/dL — ABNORMAL LOW (ref 6.5–8.1)

## 2018-12-28 LAB — URINALYSIS, ROUTINE W REFLEX MICROSCOPIC
Bilirubin Urine: NEGATIVE
Glucose, UA: NEGATIVE mg/dL
Hgb urine dipstick: NEGATIVE
Ketones, ur: 5 mg/dL — AB
Leukocytes,Ua: NEGATIVE
Nitrite: NEGATIVE
Protein, ur: NEGATIVE mg/dL
Specific Gravity, Urine: 1.036 — ABNORMAL HIGH (ref 1.005–1.030)
pH: 5 (ref 5.0–8.0)

## 2018-12-28 LAB — CBC
HCT: 43.8 % (ref 39.0–52.0)
Hemoglobin: 14.8 g/dL (ref 13.0–17.0)
MCH: 34.5 pg — ABNORMAL HIGH (ref 26.0–34.0)
MCHC: 33.8 g/dL (ref 30.0–36.0)
MCV: 102.1 fL — ABNORMAL HIGH (ref 80.0–100.0)
NRBC: 0 % (ref 0.0–0.2)
Platelets: 92 10*3/uL — ABNORMAL LOW (ref 150–400)
RBC: 4.29 MIL/uL (ref 4.22–5.81)
RDW: 13.6 % (ref 11.5–15.5)
WBC: 8.6 10*3/uL (ref 4.0–10.5)

## 2018-12-28 LAB — PROTIME-INR
INR: 0.9 (ref 0.8–1.2)
Prothrombin Time: 12.4 seconds (ref 11.4–15.2)

## 2018-12-28 LAB — LIPID PANEL
CHOLESTEROL: 1108 mg/dL — AB (ref 0–200)
HDL: 11 mg/dL — ABNORMAL LOW (ref >40)
LDL Cholesterol: UNDETERMINED mg/dL (ref 0–99)
Triglycerides: >5000 mg/dL — ABNORMAL HIGH (ref <150)
VLDL: UNDETERMINED mg/dL (ref 0–40)

## 2018-12-28 LAB — HEMOGLOBIN A1C
HEMOGLOBIN A1C: 4.9 % (ref 4.8–5.6)
Mean Plasma Glucose: 93.93 mg/dL

## 2018-12-28 LAB — ETHANOL: Alcohol, Ethyl (B): <10 mg/dL (ref <10)

## 2018-12-28 LAB — MAGNESIUM: Magnesium: 1.7 mg/dL (ref 1.7–2.4)

## 2018-12-28 LAB — GAMMA GT: GGT: 1012 U/L — ABNORMAL HIGH (ref 7–50)

## 2018-12-28 LAB — LIPASE, BLOOD
Lipase: 209 U/L — ABNORMAL HIGH (ref 11–51)
Lipase: 840 U/L — ABNORMAL HIGH (ref 11–51)

## 2018-12-28 LAB — HIV ANTIBODY (ROUTINE TESTING W REFLEX): HIV Screen 4th Generation wRfx: NONREACTIVE

## 2018-12-28 LAB — PHOSPHORUS: Phosphorus: 2.9 mg/dL (ref 2.5–4.6)

## 2018-12-28 MED ORDER — ZOLPIDEM TARTRATE 5 MG PO TABS
5.0000 mg | ORAL_TABLET | Freq: Every evening | ORAL | Status: DC | PRN
  Administered 2018-12-28 – 2019-01-05 (×6): 5 mg via ORAL
  Filled 2018-12-28 (×7): qty 1

## 2018-12-28 MED ORDER — SODIUM CHLORIDE 0.9 % IV BOLUS
1000.0000 mL | Freq: Once | INTRAVENOUS | Status: AC
  Administered 2018-12-28: 1000 mL via INTRAVENOUS

## 2018-12-28 MED ORDER — HYDROMORPHONE HCL 1 MG/ML IJ SOLN
1.0000 mg | Freq: Once | INTRAMUSCULAR | Status: AC
  Administered 2018-12-28: 1 mg via INTRAVENOUS
  Filled 2018-12-28: qty 1

## 2018-12-28 MED ORDER — FENTANYL CITRATE (PF) 100 MCG/2ML IJ SOLN
100.0000 ug | Freq: Once | INTRAMUSCULAR | Status: AC
  Administered 2018-12-28: 100 ug via INTRAVENOUS
  Filled 2018-12-28: qty 2

## 2018-12-28 MED ORDER — ENOXAPARIN SODIUM 40 MG/0.4ML ~~LOC~~ SOLN
40.0000 mg | SUBCUTANEOUS | Status: DC
  Administered 2018-12-28 – 2018-12-30 (×3): 40 mg via SUBCUTANEOUS
  Filled 2018-12-28 (×3): qty 0.4

## 2018-12-28 MED ORDER — MORPHINE SULFATE (PF) 2 MG/ML IV SOLN
1.0000 mg | INTRAVENOUS | Status: DC | PRN
  Administered 2018-12-28 – 2018-12-29 (×6): 3 mg via INTRAVENOUS
  Administered 2018-12-29 (×2): 2 mg via INTRAVENOUS
  Administered 2018-12-29 (×2): 3 mg via INTRAVENOUS
  Administered 2018-12-29 – 2018-12-30 (×6): 2 mg via INTRAVENOUS
  Filled 2018-12-28: qty 1
  Filled 2018-12-28: qty 2
  Filled 2018-12-28: qty 1
  Filled 2018-12-28 (×4): qty 2
  Filled 2018-12-28: qty 1
  Filled 2018-12-28 (×4): qty 2
  Filled 2018-12-28 (×4): qty 1

## 2018-12-28 MED ORDER — ONDANSETRON HCL 4 MG PO TABS: 4.0000 mg | ORAL_TABLET | Freq: Four times a day (QID) | ORAL | Status: DC | PRN

## 2018-12-28 MED ORDER — SODIUM CHLORIDE 0.9 % IV SOLN
INTRAVENOUS | Status: DC
  Administered 2018-12-28 – 2018-12-30 (×8): via INTRAVENOUS

## 2018-12-28 MED ORDER — ONDANSETRON HCL 4 MG/2ML IJ SOLN
4.0000 mg | Freq: Four times a day (QID) | INTRAMUSCULAR | Status: DC | PRN
  Administered 2018-12-28 – 2019-01-08 (×9): 4 mg via INTRAVENOUS
  Filled 2018-12-28 (×11): qty 2

## 2018-12-28 NOTE — Progress Notes (Signed)
Patient is a 28 year old male with history of alcohol abuse, substance abuse, smoker who presents to the emergency department with complaints of abdominal pain.  Admitted for the management of acute pancreatitis related to alcohol. Patient seen and examined the bedside this morning.  Currently hemodynamically stable.  Abdomen pain has improved but he still has persistent nausea. Continue IV fluids, antiemetics. Will keep him NPO. Lipid panel this morning so triglyceride level more than 5000.  Not sure that, pancreatitis was caused by hypertriglyceridemia or alcohol but most likely this is from alcohol.Will repeat lipid panel tomorrow. Patient seen by Dr. Joseph Art this morning.

## 2018-12-28 NOTE — ED Notes (Signed)
Pt c/o severe pain again, states it keeps coming back .

## 2018-12-28 NOTE — ED Provider Notes (Signed)
Care assumed from [facility].  Patient with upper abdominal pain for the past several months which has worsened over the past 2 days with multiple episodes of nonbilious nonbloody emesis.  Awaiting CT scan.  Does have lipase of 209.  Right upper quadrant ultrasound is negative.  Patient does have history of binging with alcohol on the weekends.  Denies having any withdrawal symptoms.  CT scan shows edema of the pancreas with elevated LFTs.  Pain still significant requiring multiple doses of pain medication and antiemetics.  Patient agreeable to admission. D/w Dr. Loney Loh.    Glynn Octave, MD 12/28/18 [phone]

## 2018-12-28 NOTE — H&P (Addendum)
[facility] History and Physical  Charles Dougherty ZOX:[id number] DOB: June 26, 1991 DOA: 12/27/2018  Referring physician:  PCP: Patient, No Pcp Per   Chief Complaint: Abdominal pain  HPI: Charles Dougherty is a 28 y.o. WM PMHx EtOH abuse (drink 20 beers per week; stopped 2 months ago), drug abuse (marijuana).  Presents for evaluation of abdominal pain.  Patient states he has had epigastric and right upper quadrant abdominal pain x months. Worse over the last 2 days.  Patient states he has had multiple episodes of nonbloody, nonbilious emesis.  Has not taken anything for his symptoms.  Denies NSAID use, tobacco, alcohol use.  Patient states symptoms worse after eating.  Patient states he is also had some bright red blood with wiping with rectal movements.  Patient has known history of external hemorrhoids.  States this is consistent.  Rates his pain an 8/10.  Pain does not radiate.  Denies any additional aggravating or alleviating factors.  Denies fever, chills, chest pain, diarrhea, dysuria.  She states he has felt short of breath when he gets "waves of pain." Denies tobacco or alcohol use.   Review of Systems:  Constitutional:  No weight loss, night sweats, Fevers, chills, fatigue.  HEENT:  No headaches, Difficulty swallowing,Tooth/dental problems,Sore throat,  No sneezing, itching, ear ache, nasal congestion, post nasal drip,  Cardio-vascular:  No chest pain, Orthopnea, PND, swelling in lower extremities, anasarca, dizziness, palpitations  GI:  No heartburn, indigestion, positive abdominal pain, nausea, vomiting, negative diarrhea, change in bowel habits, loss of appetite  Resp:  No shortness of breath with exertion or at rest. No excess mucus, no productive cough, No non-productive cough, No coughing up of blood.No change in color of mucus.No wheezing.No chest wall deformity  Skin:  no rash or lesions.  GU:  no dysuria, change in color of urine, no urgency or frequency. No flank pain.   Musculoskeletal:  No joint pain or swelling. No decreased range of motion. No back pain.  Psych:  No change in mood or affect. No depression or anxiety. No memory loss.   Past Medical History:  Diagnosis Date  . Allergy   . Asthma   . Atopic eczema   . Cystitis 12/28/2018  . Drug abuse (HCC) 12/28/2018  . ETOH abuse 12/28/2018   History reviewed. No pertinent surgical history. Social History:  reports that he has been smoking cigarettes. He has never used smokeless tobacco. He reports current alcohol use. He reports current drug use. Drug: Marijuana.  Allergies  Allergen Reactions  . Shellfish-Derived Products Nausea And Vomiting    *Mussels*    Family History  Problem Relation Age of Onset  . Hypertension Mother   . Hyperlipidemia Mother   . Nephrolithiasis Father   . GER disease Brother      Prior to Admission medications   Medication Sig Start Date End Date Taking? Authorizing Provider  cetirizine (ZYRTEC ALLERGY) 10 MG tablet Take 10 mg by mouth daily.    [provider]  meloxicam (MOBIC) 15 MG tablet Take 1 tablet (15 mg total) by mouth daily. 01/02/17   Ofilia Neas, PA-C     Consultants:  None    Procedures/Significant Events:   3/13 ultrasound abdomen RUQ: Positive hepatic steatosis, negative gallbladder/biliary obstruction 3/14 CT abdomen and pelvis with contrast:  1. Infiltration and edema around the head and body of the pancreas consistent with acute edematous interstitial pancreatitis. No loculated fluid collections. No evidence of pancreatic necrosis. 2. Diffuse fatty infiltration of the liver. 3.  Bladder wall thickening may indicate cystitis       I have personally reviewed and interpreted all radiology studies and my findings are as above.   VENTILATOR SETTINGS:    Cultures 3/14 urine culture pending  Antimicrobials: None   Devices   LINES / TUBES:      Continuous Infusions:  Physical Exam: Vitals:    12/28/18 0100 12/28/18 0155 12/28/18 0230 12/28/18 0337  BP: (!) 144/97 (!) 144/88 (!) 138/93 (!) 147/97  Pulse: (!) 109 88 (!) 105 (!) 111  Resp:  20 20 20   Temp:  98.3 F (36.8 C) 98 F (36.7 C) 98.6 F (37 C)  TempSrc:  Oral  Oral  SpO2: 93% 100% 98% 97%  Weight:    69.7 kg  Height:    5\' 10"  (1.778 m)    Wt Readings from Last 3 Encounters:  12/28/18 69.7 kg  01/08/17 70 kg  01/02/17 71.2 kg    General: No acute respiratory distress Eyes: negative scleral hemorrhage, negative anisocoria, negative icterus ENT: Negative Runny nose, negative gingival bleeding, Neck:  Negative scars, masses, torticollis, lymphadenopathy, JVD Lungs: Clear to auscultation bilaterally without wheezes or crackles Cardiovascular: Regular rate and rhythm without murmur gallop or rub normal S1 and S2 Abdomen: positive abdominal pain, positive distended, positive soft, bowel sounds, no rebound, no ascites, no appreciable mass Extremities: No significant cyanosis, clubbing, or edema bilateral lower extremities Skin: Negative rashes, lesions, ulcers Psychiatric:  Negative depression, negative anxiety, negative fatigue, negative mania  Central nervous system:  Cranial nerves II through XII intact, tongue/uvula midline, all extremities muscle strength 5/5, sensation intact throughout, negative dysarthria, negative expressive aphasia, negative receptive aphasia.        Labs on Admission:  Basic Metabolic Panel: Recent Labs  Lab 12/27/18 2203  NA 133*  K 3.5  CL 99  CO2 18*  GLUCOSE 99  BUN 14  CREATININE 1.34*  CALCIUM 8.9   Liver Function Tests: Recent Labs  Lab 12/27/18 2203  AST 158*  ALT 150*  ALKPHOS 97  BILITOT 1.1  PROT 6.1*  ALBUMIN 3.8   Recent Labs  Lab 12/27/18 2203  LIPASE 209*   No results for input(s): AMMONIA in the last 168 hours. CBC: Recent Labs  Lab 12/27/18 2203  WBC 10.2  HGB 14.4  HCT 39.6  MCV 96.1  PLT 103*   Cardiac Enzymes: No results for  input(s): CKTOTAL, CKMB, CKMBINDEX, TROPONINI in the last 168 hours.  BNP (last 3 results) No results for input(s): BNP in the last 8760 hours.  ProBNP (last 3 results) No results for input(s): PROBNP in the last 8760 hours.  CBG: No results for input(s): GLUCAP in the last 168 hours.  Radiological Exams on Admission: Ct Abdomen Pelvis W Contrast  Result Date: 12/28/2018 CLINICAL DATA:  Severe mid abdominal pain and the umbilicus. Tenderness. Nausea, vomiting, and diarrhea. Symptoms for 3 days. EXAM: CT ABDOMEN AND PELVIS WITH CONTRAST TECHNIQUE: Multidetector CT imaging of the abdomen and pelvis was performed using the standard protocol following bolus administration of intravenous contrast. CONTRAST:  OMNIPAQUE IOHEXOL 300 MG/ML  SOLN COMPARISON:  Ultrasound right upper quadrant 12/27/2018 FINDINGS: Lower chest: Lung bases are clear. Hepatobiliary: Diffuse fatty infiltration of the liver. No focal liver lesions. Gallbladder and bile ducts are unremarkable. Pancreas: Infiltration and edema around the head and body of the pancreas. No loculated fluid collections. Pancreatic parenchyma is homogeneous with normal enhancement. Changes are consistent with acute edematous interstitial pancreatitis. No pancreatic ductal dilatation.  Spleen: Normal in size without focal abnormality. Adrenals/Urinary Tract: Adrenal glands are unremarkable. Kidneys are normal, without renal calculi, focal lesion, or hydronephrosis. Bladder wall is diffusely thickened possibly indicating cystitis. Stomach/Bowel: Stomach, small bowel, and colon are not abnormally distended. There is mild edema in the colonic wall at the hepatic flexure, likely representing reactive inflammation. Appendix is normal. Vascular/Lymphatic: No significant vascular findings are present. No enlarged abdominal or pelvic lymph nodes. Reproductive: Prostate is unremarkable. Other: No free air or free fluid in the abdomen. Abdominal wall musculature  appears intact. Musculoskeletal: No acute or significant osseous findings. IMPRESSION: 1. Infiltration and edema around the head and body of the pancreas consistent with acute edematous interstitial pancreatitis. No loculated fluid collections. No evidence of pancreatic necrosis. 2. Diffuse fatty infiltration of the liver. 3. Bladder wall thickening may indicate cystitis. Electronically Signed   By: Burman Nieves M.D.   On: 12/28/2018 00:18   US Abdomen Limited Ruq  Result Date: 12/27/2018 CLINICAL DATA:  28 year old male with right upper quadrant abdominal pain for 2 months, but progressed in the past 2 days. EXAM: ULTRASOUND ABDOMEN LIMITED RIGHT UPPER QUADRANT COMPARISON:  None. FINDINGS: Gallbladder: No gallstones or wall thickening visualized. No sonographic Murphy sign noted by sonographer. Common bile duct: Diameter: 3 millimeters, normal. Liver: Echogenic liver (image 15). No discrete liver lesion. No intrahepatic biliary ductal dilatation. Portal vein is patent on color Doppler imaging with normal direction of blood flow towards the liver. Other findings: Negative visible right kidney. IMPRESSION: 1. Hepatic steatosis. 2. Negative gallbladder.  No evidence of biliary obstruction. Electronically Signed   By: Odessa Fleming M.D.   On: 12/27/2018 23:26    EKG:  Assessment/Plan Active Problems:   Acute alcoholic pancreatitis   Pancreatitis   ETOH abuse   Drug abuse (HCC)   Cystitis   Acute EtOH pancreatitis - Patient reports last drink 2 months ago.  Patient counseled extensively on need to NEVER drink again. - Lipase elevated continue to trend - EtOH, rapid urine drug screen pending -Hemoglobin A1c pending  Drug abuse -Patient admits to using marijuana - See pancreatitis  Cystitis - Urinalysis/urine culture pending we will hold on starting antibiotics at this point given patient W/O elevated WBC and negative fever   Code Status: Full (DVT Prophylaxis: Lovenox Family Communication:  None Disposition Plan: TBD   Data Reviewed: Care during the described time interval was provided by me .  I have reviewed this patient's available data, including medical history, events of note, physical examination, and all test results as part of my evaluation.   Time spent: 60 min  WOODS, Roselind Messier [facility] Pager [phone]

## 2018-12-28 NOTE — ED Notes (Signed)
Report to carelink.  

## 2018-12-28 NOTE — ED Notes (Signed)
ED TO INPATIENT HANDOFF REPORT  ED Nurse Name and Phone #: Mendel Corning RN [phone]  S Name/Age/Gender Charles Dougherty 28 y.o. male Room/Bed: MH04/MH04  Code Status   Code Status: Not on file  Home/SNF/Other Admission to [facility] Alert and orient times 4 Is this baseline? Yes  Triage Complete: Triage complete  Chief Complaint Abdominal Pain  Triage Note C/o abd pain x 2 months-dizziness, SOB started yesterday-blood in stool x today-seen at [facility]-states pain has increased and he was unable to get outpt lab done before closing today-NAD-steady gait-mother with pt   Allergies No Known Allergies  Level of Care/Admitting Diagnosis ED Disposition    ED Disposition Condition Comment   Admit  Hospital Area: [facility] [[id number]]  Level of Care: Med-Surg [16]  Diagnosis: Acute alcoholic pancreatitis [322025]  Admitting Physician: John Giovanni [[id number]]  Attending Physician: John Giovanni [[id number]]  PT Class (Do Not Modify): Observation [104]  PT Acc Code (Do Not Modify): Observation [10022]       B Medical/Surgery History Past Medical History:  Diagnosis Date  . Allergy   . Asthma   . Atopic eczema    History reviewed. No pertinent surgical history.   A IV Location/Drains/Wounds Patient Lines/Drains/Airways Status   Active Line/Drains/Airways    Name:   Placement date:   Placement time:   Site:   Days:   Peripheral IV 12/27/18 Right Antecubital   12/27/18    2202    Antecubital   1          Intake/Output Last 24 hours  Intake/Output Summary (Last 24 hours) at 12/28/2018 0201 Last data filed at 12/28/2018 1761 Gross per 24 hour  Intake 2001.03 ml  Output 800 ml  Net 1201.03 ml    Labs/Imaging Results for orders placed or performed during the hospital encounter of 12/27/18 (from the past 48 hour(s))  Lipase, blood     Status: Abnormal   Collection Time: 12/27/18 10:03 PM  Result Value Ref Range   Lipase 209 (H) 11 - 51 U/L     Comment: POST-ULTRACENTRIFUGATION Performed at [facility], [address] [place], [place] [address]   Comprehensive metabolic panel     Status: Abnormal   Collection Time: 12/27/18 10:03 PM  Result Value Ref Range   Sodium 133 (L) 135 - 145 mmol/L    Comment: POST-ULTRACENTRIFUGATION   Potassium 3.5 3.5 - 5.1 mmol/L   Chloride 99 98 - 111 mmol/L   CO2 18 (L) 22 - 32 mmol/L   Glucose, Bld 99 70 - 99 mg/dL   BUN 14 6 - 20 mg/dL   Creatinine, Ser 1.06 (H) 0.61 - 1.24 mg/dL   Calcium 8.9 8.9 - 26.9 mg/dL   Total Protein 6.1 (L) 6.5 - 8.1 g/dL   Albumin 3.8 3.5 - 5.0 g/dL   AST 485 (H) 15 - 41 U/L   ALT 150 (H) 0 - 44 U/L   Alkaline Phosphatase 97 38 - 126 U/L   Total Bilirubin 1.1 0.3 - 1.2 mg/dL   GFR calc non Af Amer >60 >60 mL/min   GFR calc Af Amer >60 >60 mL/min   Anion gap 16 (H) 5 - 15    Comment: Performed at [facility], [address] [place], [place] [address]  CBC     Status: Abnormal   Collection Time: 12/27/18 10:03 PM  Result Value Ref Range   WBC 10.2 4.0 - 10.5 K/uL   RBC 4.12 (L) 4.22 -  5.81 MIL/uL   Hemoglobin 14.4 13.0 - 17.0 g/dL   HCT 94.8 54.6 - 27.0 %   MCV 96.1 80.0 - 100.0 fL   MCH 35.0 (H) 26.0 - 34.0 pg   MCHC 36.4 (H) 30.0 - 36.0 g/dL    Comment: CORRECTED FOR LIPEMIA   RDW 13.2 11.5 - 15.5 %   Platelets 103 (L) 150 - 400 K/uL    Comment: PLATELET COUNT CONFIRMED BY SMEAR   nRBC 0.00 0.0 - 0.2 %    Comment: Performed at [facility], [address] [place], [place] [address]  Urinalysis, Routine w reflex microscopic     Status: None   Collection Time: 12/27/18 10:03 PM  Result Value Ref Range   Color, Urine YELLOW YELLOW   APPearance CLEAR CLEAR   Specific Gravity, Urine 1.010 1.005 - 1.030   pH 6.0 5.0 - 8.0   Glucose, UA NEGATIVE NEGATIVE mg/dL   Hgb urine dipstick NEGATIVE NEGATIVE   Bilirubin Urine NEGATIVE NEGATIVE   Ketones, ur NEGATIVE NEGATIVE mg/dL   Protein, ur NEGATIVE NEGATIVE mg/dL   Nitrite  NEGATIVE NEGATIVE   Leukocytes,Ua NEGATIVE NEGATIVE    Comment: Microscopic not done on urines with negative protein, blood, leukocytes, nitrite, or glucose < 500 mg/dL. Performed at [facility], [address] [place], [place] [address]    Ct Abdomen Pelvis W Contrast  Result Date: 12/28/2018 CLINICAL DATA:  Severe mid abdominal pain and the umbilicus. Tenderness. Nausea, vomiting, and diarrhea. Symptoms for 3 days. EXAM: CT ABDOMEN AND PELVIS WITH CONTRAST TECHNIQUE: Multidetector CT imaging of the abdomen and pelvis was performed using the standard protocol following bolus administration of intravenous contrast. CONTRAST:  OMNIPAQUE IOHEXOL 300 MG/ML  SOLN COMPARISON:  Ultrasound right upper quadrant 12/27/2018 FINDINGS: Lower chest: Lung bases are clear. Hepatobiliary: Diffuse fatty infiltration of the liver. No focal liver lesions. Gallbladder and bile ducts are unremarkable. Pancreas: Infiltration and edema around the head and body of the pancreas. No loculated fluid collections. Pancreatic parenchyma is homogeneous with normal enhancement. Changes are consistent with acute edematous interstitial pancreatitis. No pancreatic ductal dilatation. Spleen: Normal in size without focal abnormality. Adrenals/Urinary Tract: Adrenal glands are unremarkable. Kidneys are normal, without renal calculi, focal lesion, or hydronephrosis. Bladder wall is diffusely thickened possibly indicating cystitis. Stomach/Bowel: Stomach, small bowel, and colon are not abnormally distended. There is mild edema in the colonic wall at the hepatic flexure, likely representing reactive inflammation. Appendix is normal. Vascular/Lymphatic: No significant vascular findings are present. No enlarged abdominal or pelvic lymph nodes. Reproductive: Prostate is unremarkable. Other: No free air or free fluid in the abdomen. Abdominal wall musculature appears intact. Musculoskeletal: No acute or significant osseous findings.  IMPRESSION: 1. Infiltration and edema around the head and body of the pancreas consistent with acute edematous interstitial pancreatitis. No loculated fluid collections. No evidence of pancreatic necrosis. 2. Diffuse fatty infiltration of the liver. 3. Bladder wall thickening may indicate cystitis. Electronically Signed   By: Burman Nieves M.D.   On: 12/28/2018 00:18   US Abdomen Limited Ruq  Result Date: 12/27/2018 CLINICAL DATA:  28 year old male with right upper quadrant abdominal pain for 2 months, but progressed in the past 2 days. EXAM: ULTRASOUND ABDOMEN LIMITED RIGHT UPPER QUADRANT COMPARISON:  None. FINDINGS: Gallbladder: No gallstones or wall thickening visualized. No sonographic Murphy sign noted by sonographer. Common bile duct: Diameter: 3 millimeters, normal. Liver: Echogenic liver (image 15). No discrete liver lesion. No intrahepatic biliary ductal  dilatation. Portal vein is patent on color Doppler imaging with normal direction of blood flow towards the liver. Other findings: Negative visible right kidney. IMPRESSION: 1. Hepatic steatosis. 2. Negative gallbladder.  No evidence of biliary obstruction. Electronically Signed   By: Odessa Fleming M.D.   On: 12/27/2018 23:26    Pending Labs Unresulted Labs (From admission, onward)   None      Vitals/Pain Today's Vitals   12/28/18 0045 12/28/18 0100 12/28/18 0134 12/28/18 0155  BP: 137/86 (!) 144/97  (!) 144/88  Pulse: 88 (!) 109  88  Resp: 20   20  Temp:    98.3 F (36.8 C)  TempSrc:    Oral  SpO2: 100% 93%  100%  Weight:      Height:      PainSc: Isolation Precautions No active isolations  Medications Medications  sodium chloride 0.9 % bolus 1,000 mL (0 mLs Intravenous Stopped 12/28/18 0024)  ondansetron (ZOFRAN) injection 4 mg (4 mg Intravenous Given 12/27/18 2254)  morphine 4 MG/ML injection 4 mg (4 mg Intravenous Given 12/27/18 2254)  alum & mag hydroxide-simeth (MAALOX/MYLANTA) 200-200-20 MG/5ML suspension 30  mL (30 mLs Oral Given 12/27/18 2347)    And  lidocaine (XYLOCAINE) 2 % viscous mouth solution 15 mL (15 mLs Oral Given 12/27/18 2347)  HYDROmorphone (DILAUDID) injection 1 mg (1 mg Intravenous Given 12/27/18 2347)  iohexol (OMNIPAQUE) 300 MG/ML solution 100 mL (100 mLs Intravenous Contrast Given 12/27/18 2350)  sodium chloride 0.9 % bolus 1,000 mL ( Intravenous Stopped 12/28/18 0133)  HYDROmorphone (DILAUDID) injection 1 mg (1 mg Intravenous Given 12/28/18 0058)  fentaNYL (SUBLIMAZE) injection 100 mcg (100 mcg Intravenous Given 12/28/18 0152)    Mobility Walks Low fall risk   Focused Assessments    R Recommendations: See Admitting Provider Note  Report given to: Alvino Chapel RN  Additional Notes:

## 2018-12-28 NOTE — Plan of Care (Signed)
Patient very anxious and in pain.  Awaiting orders from admitting physician.

## 2018-12-29 ENCOUNTER — Inpatient Hospital Stay (HOSPITAL_COMMUNITY): Payer: Federal, State, Local not specified - PPO

## 2018-12-29 LAB — COMPREHENSIVE METABOLIC PANEL
ALT: 61 U/L — ABNORMAL HIGH (ref 0–44)
AST: 87 U/L — ABNORMAL HIGH (ref 15–41)
Albumin: 2.1 g/dL — ABNORMAL LOW (ref 3.5–5.0)
Alkaline Phosphatase: 63 U/L (ref 38–126)
Anion gap: 11 (ref 5–15)
BUN: 18 mg/dL (ref 6–20)
CO2: 11 mmol/L — ABNORMAL LOW (ref 22–32)
Calcium: 4.1 mg/dL — CL (ref 8.9–10.3)
Chloride: 110 mmol/L (ref 98–111)
Creatinine, Ser: 1.79 mg/dL — ABNORMAL HIGH (ref 0.61–1.24)
GFR calc Af Amer: 60 mL/min — ABNORMAL LOW (ref >60)
GFR calc non Af Amer: 52 mL/min — ABNORMAL LOW (ref >60)
Glucose, Bld: 177 mg/dL — ABNORMAL HIGH (ref 70–99)
Potassium: 4.3 mmol/L (ref 3.5–5.1)
Sodium: 132 mmol/L — ABNORMAL LOW (ref 135–145)
Total Bilirubin: 1.3 mg/dL — ABNORMAL HIGH (ref 0.3–1.2)
Total Protein: 4.9 g/dL — ABNORMAL LOW (ref 6.5–8.1)

## 2018-12-29 LAB — URINE CULTURE: Culture: NO GROWTH

## 2018-12-29 LAB — CBC WITH DIFFERENTIAL/PLATELET
Abs Immature Granulocytes: 0.1 10*3/uL — ABNORMAL HIGH (ref 0.00–0.07)
Basophils Absolute: 0.1 10*3/uL (ref 0.0–0.1)
Basophils Relative: 1 %
Eosinophils Absolute: 0 10*3/uL (ref 0.0–0.5)
Eosinophils Relative: 0 %
HCT: 45 % (ref 39.0–52.0)
Hemoglobin: 16.6 g/dL (ref 13.0–17.0)
Immature Granulocytes: 1 %
Lymphocytes Relative: 10 %
Lymphs Abs: 0.8 10*3/uL (ref 0.7–4.0)
MCH: 37.6 pg — ABNORMAL HIGH (ref 26.0–34.0)
MCHC: 36.9 g/dL — ABNORMAL HIGH (ref 30.0–36.0)
MCV: 102 fL — ABNORMAL HIGH (ref 80.0–100.0)
MONO ABS: 0.4 10*3/uL (ref 0.1–1.0)
Monocytes Relative: 5 %
Neutro Abs: 6.3 10*3/uL (ref 1.7–7.7)
Neutrophils Relative %: 83 %
Platelets: 84 10*3/uL — ABNORMAL LOW (ref 150–400)
RBC: 4.41 MIL/uL (ref 4.22–5.81)
RDW: 13.8 % (ref 11.5–15.5)
WBC: 7.6 10*3/uL (ref 4.0–10.5)
nRBC: 0 % (ref 0.0–0.2)

## 2018-12-29 MED ORDER — PHENOL 1.4 % MT LIQD
1.0000 | OROMUCOSAL | Status: DC | PRN
  Administered 2018-12-29: 1 via OROMUCOSAL
  Filled 2018-12-29: qty 177

## 2018-12-29 MED ORDER — CALCIUM GLUCONATE-NACL 1-0.675 GM/50ML-% IV SOLN
1.0000 g | Freq: Once | INTRAVENOUS | Status: AC
  Administered 2018-12-29: 1000 mg via INTRAVENOUS
  Filled 2018-12-29: qty 50

## 2018-12-29 NOTE — Progress Notes (Signed)
PROGRESS NOTE    Charles Dougherty  ZOX:[id number] DOB: 03-26-1991 DOA: 12/27/2018 PCP: Patient, No Pcp Per   Brief Narrative: Patient is a 28 year old male with history of alcohol abuse, substance abuse, smoker who presents to the emergency department with complaints of abdominal pain.  Admitted for the management of acute pancreatitis related to alcohol.  Patient developed abdominal distention today.  Abdominal x-ray suggest early bowel obstruction.  Assessment & Plan:   Active Problems:   Acute alcoholic pancreatitis   Pancreatitis   ETOH abuse   Drug abuse (HCC)   Cystitis  Acute alcoholic pancreatitis: Presented with abdominal pain, nausea and vomiting.  Elevated lipase with liver enzymes.  Everyday drinker.  Drinks beer usually.  Started on conservative management with IV fluids, n.p.o. and pain medications. CT imaging showed Infiltration and edema around the head and body of the pancreas consistent with acute edematous interstitial pancreatitis. No loculated fluid collections. No evidence of pancreatic necrosis.  Early bowel obstruction: Abdominal pain has somewhat improved today but his abdomen was found to be distended this morning.  Last bowel movement about 2 days ago.  Not passing any flatus. Abdominal x-ray showed early/partial obstruction versus ileus. Will request consult for general surgery.  We may need to put NG tube today.  Elevated liver enzymes: Imagings done in the did not show any gallbladder stones or inflammation.  Also has Diffuse fatty infiltration of the liver.  Liver enzymes improving.  We will continue to monitor the levels.  Hypertriglyceridemia: Triglyceride level more than 5000 yesterday.  Repeat triglyceride level pending.  Substance abuse: UDS positive for amphetamines, opiates, cannabis.  Discussed with mother today on phone and she is interested on rehabilitation.  Social worker consulted.  Counseled for cessation of substance abuse.  Suspected  Cystitis: As per CT imaging.  Denies any lower abdominal pain or increased frequency of urination.  Urinalysis not suggestive of urinary tract infection.No antibiotics indicated at this time.           DVT prophylaxis: Lovenox Code Status: Full Family Communication: Discussed with mother at the bedside Disposition Plan: Home after resolution of pancreatitis, bowel obstruction   Consultants: Consult called for general surgery awaiting callback  Procedures: None  Antimicrobials:  Anti-infectives (From admission, onward)   None      Subjective: Patient seen and examined the bedside this morning.   Hemodynamically stable.  Abdominal pain is somewhat better.  Developed abdominal distention today.  Not passing flatus.  Last bowel meant 2 days ago.  Uncomfortable.  Objective: Vitals:   12/28/18 1354 12/28/18 2240 12/28/18 2250 12/29/18 0624  BP: (!) 141/106 (!) 121/93  (!) 129/95  Pulse: (!) 106 (!) 125 (!) 106 (!) 122  Resp: Temp: 98.1 F (36.7 C) 97.7 F (36.5 C)  97.8 F (36.6 C)  TempSrc: Oral Oral  Oral  SpO2: 97% 96%  96%  Weight:      Height:        Intake/Output Summary (Last 24 hours) at 12/29/2018 1103 Last data filed at 12/28/2018 1815 Gross per 24 hour  Intake 1609.84 ml  Output 200 ml  Net 1409.84 ml   Filed Weights   12/27/18 2143 12/28/18 0337  Weight: 70.3 kg 69.7 kg    Examination:  General exam: Appears calm and comfortable ,Not in distress,average built HEENT:PERRL,Oral mucosa moist, Ear/Nose normal on gross exam Respiratory system: Bilateral equal air entry, normal vesicular breath sounds, no wheezes or crackles  Cardiovascular system: S1 & S2  heard, RRR. No JVD, murmurs, rubs, gallops or clicks. No pedal edema. Gastrointestinal system: Abdomen is distended, mild generalized tenderness.  No bowel sounds heard . Central nervous system: Alert and oriented. No focal neurological deficits. Extremities: No edema, no clubbing ,no  cyanosis, distal peripheral pulses palpable. Skin: No rashes, lesions or ulcers,no icterus ,no pallor MSK: Normal muscle bulk,tone ,power Psychiatry: Judgement and insight appear normal. Mood & affect appropriate.     Data Reviewed: I have personally reviewed following labs and imaging studies  CBC: Recent Labs  Lab 12/27/18 2203 12/28/18 0524  WBC 10.2 8.6  HGB 14.4 14.8  HCT 39.6 43.8  MCV 96.1 102.1*  PLT 103* 92*   Basic Metabolic Panel: Recent Labs  Lab 12/27/18 2203 12/28/18 0524  NA 133* 137  K 3.5 3.5  CL 99 109  CO2 18* 16*  GLUCOSE 99 108*  BUN 14 6  CREATININE 1.34* 1.10  CALCIUM 8.9 7.0*  MG  --  1.7  PHOS  --  2.9   GFR: Estimated Creatinine Clearance: 101.2 mL/min (by C-G formula based on SCr of 1.1 mg/dL). Liver Function Tests: Recent Labs  Lab 12/27/18 2203 12/28/18 0524  AST 158* 105*  ALT 150* 117*  ALKPHOS 97 78  BILITOT 1.1 1.9*  PROT 6.1* 5.5*  ALBUMIN 3.8 3.2*   Recent Labs  Lab 12/27/18 2203 12/28/18 0524  LIPASE 209* 840*   No results for input(s): AMMONIA in the last 168 hours. Coagulation Profile: Recent Labs  Lab 12/28/18 0524  INR 0.9   Cardiac Enzymes: No results for input(s): CKTOTAL, CKMB, CKMBINDEX, TROPONINI in the last 168 hours. BNP (last 3 results) No results for input(s): PROBNP in the last 8760 hours. HbA1C: Recent Labs    12/28/18 0524  HGBA1C 4.9   CBG: No results for input(s): GLUCAP in the last 168 hours. Lipid Profile: Recent Labs    12/28/18 0524  CHOL 1,108*  HDL 11*  LDLCALC UNABLE TO CALCULATE IF TRIGLYCERIDE OVER 400 mg/dL  TRIG >6,979*  CHOLHDL NOT REPORTED DUE TO HIGH TRIGLYCERIDES   Thyroid Function Tests: No results for input(s): TSH, T4TOTAL, FREET4, T3FREE, THYROIDAB in the last 72 hours. Anemia Panel: No results for input(s): VITAMINB12, FOLATE, FERRITIN, TIBC, IRON, RETICCTPCT in the last 72 hours. Sepsis Labs: No results for input(s): PROCALCITON, LATICACIDVEN in the last  168 hours.  No results found for this or any previous visit (from the past 240 hour(s)).       Radiology Studies: Dg Abd 1 View  Result Date: 12/29/2018 CLINICAL DATA:  Patient with generalized abdominal pain. EXAM: ABDOMEN - 1 VIEW COMPARISON:  CT abdomen pelvis 12/27/2018 FINDINGS: Multiple gaseous distended loops of small bowel are demonstrated within the abdomen measuring up to 5 cm. Small amount of colonic gas is present. Supine evaluation limited for the detection of free intraperitoneal air. IMPRESSION: Gaseous distended loops of small bowel throughout the abdomen with small amount of distal colonic gas. Findings may represent early/partial obstruction or ileus. Electronically Signed   By: Annia Belt M.D.   On: 12/29/2018 10:35   Ct Abdomen Pelvis W Contrast  Result Date: 12/28/2018 CLINICAL DATA:  Severe mid abdominal pain and the umbilicus. Tenderness. Nausea, vomiting, and diarrhea. Symptoms for 3 days. EXAM: CT ABDOMEN AND PELVIS WITH CONTRAST TECHNIQUE: Multidetector CT imaging of the abdomen and pelvis was performed using the standard protocol following bolus administration of intravenous contrast. CONTRAST:  OMNIPAQUE IOHEXOL 300 MG/ML  SOLN COMPARISON:  Ultrasound right upper quadrant  12/27/2018 FINDINGS: Lower chest: Lung bases are clear. Hepatobiliary: Diffuse fatty infiltration of the liver. No focal liver lesions. Gallbladder and bile ducts are unremarkable. Pancreas: Infiltration and edema around the head and body of the pancreas. No loculated fluid collections. Pancreatic parenchyma is homogeneous with normal enhancement. Changes are consistent with acute edematous interstitial pancreatitis. No pancreatic ductal dilatation. Spleen: Normal in size without focal abnormality. Adrenals/Urinary Tract: Adrenal glands are unremarkable. Kidneys are normal, without renal calculi, focal lesion, or hydronephrosis. Bladder wall is diffusely thickened possibly indicating cystitis.  Stomach/Bowel: Stomach, small bowel, and colon are not abnormally distended. There is mild edema in the colonic wall at the hepatic flexure, likely representing reactive inflammation. Appendix is normal. Vascular/Lymphatic: No significant vascular findings are present. No enlarged abdominal or pelvic lymph nodes. Reproductive: Prostate is unremarkable. Other: No free air or free fluid in the abdomen. Abdominal wall musculature appears intact. Musculoskeletal: No acute or significant osseous findings. IMPRESSION: 1. Infiltration and edema around the head and body of the pancreas consistent with acute edematous interstitial pancreatitis. No loculated fluid collections. No evidence of pancreatic necrosis. 2. Diffuse fatty infiltration of the liver. 3. Bladder wall thickening may indicate cystitis. Electronically Signed   By: Burman Nieves M.D.   On: 12/28/2018 00:18   US Abdomen Limited Ruq  Result Date: 12/27/2018 CLINICAL DATA:  28 year old male with right upper quadrant abdominal pain for 2 months, but progressed in the past 2 days. EXAM: ULTRASOUND ABDOMEN LIMITED RIGHT UPPER QUADRANT COMPARISON:  None. FINDINGS: Gallbladder: No gallstones or wall thickening visualized. No sonographic Murphy sign noted by sonographer. Common bile duct: Diameter: 3 millimeters, normal. Liver: Echogenic liver (image 15). No discrete liver lesion. No intrahepatic biliary ductal dilatation. Portal vein is patent on color Doppler imaging with normal direction of blood flow towards the liver. Other findings: Negative visible right kidney. IMPRESSION: 1. Hepatic steatosis. 2. Negative gallbladder.  No evidence of biliary obstruction. Electronically Signed   By: Odessa Fleming M.D.   On: 12/27/2018 23:26        Scheduled Meds: . enoxaparin (LOVENOX) injection  40 mg Subcutaneous Q24H   Continuous Infusions: . sodium chloride 150 mL/hr at 12/29/18 1041     LOS: 1 day    Time spent: 35 mins.More than 50% of that time was  spent in counseling and/or coordination of care.      Burnadette Pop, MD [facility] Pager [phone]  If 7PM-7AM, please contact night-coverage [email or web address] Password [facility] 12/29/2018, 11:03 AM

## 2018-12-29 NOTE — Progress Notes (Signed)
Attempted to place NGT at bedside, both nare with out success. Will continue to monitor.

## 2018-12-29 NOTE — Progress Notes (Addendum)
Patient continues to have a yellow MEWs score.  Patient's pulse is actually lower than it has been today (118).  Pt.'s blood pressure is elevated, but not above parameters. Patient does report being in pain, which will continue to be treated. Priscille Kluver

## 2018-12-29 NOTE — Consult Note (Signed)
Reason for Consult:Ileus Referring Physician: Ketan Renz is an 28 y.o. male.  HPI: This is a 28 year old male with a PMH of EtOH abuse, polysubstance abuse who was admitted by [facility] yesterday for alcoholic pancreatitis.  He has had epigastric and RUQ abdominal pain for several months, but this worsened over the past couple of days.  Pain is exacerbated by eating.  He has had some hematochezia and a history of external hemorrhoids.  + nausea and vomiting.  No diarrhea or fever.  Past Medical History:  Diagnosis Date  . Allergy   . Asthma   . Atopic eczema   . Cystitis 12/28/2018  . Drug abuse (HCC) 12/28/2018  . ETOH abuse 12/28/2018    History reviewed. No pertinent surgical history.  Family History  Problem Relation Age of Onset  . Hypertension Mother   . Hyperlipidemia Mother   . Nephrolithiasis Father   . GER disease Brother     Social History:  reports that he has been smoking cigarettes. He has never used smokeless tobacco. He reports current alcohol use. He reports current drug use. Drug: Marijuana.  Allergies:  Allergies  Allergen Reactions  . Shellfish-Derived Products Nausea And Vomiting    *Mussels*    Medications: Prior to Admission medications   Medication Sig Start Date End Date Taking? Authorizing Provider  calcium carbonate (TUMS - DOSED IN MG ELEMENTAL CALCIUM) 500 MG chewable tablet Chew 1 tablet by mouth as needed for indigestion or heartburn.   Yes [provider]  famotidine-calcium carbonate-magnesium hydroxide (PEPCID COMPLETE) [phone] MG chewable tablet Chew 1 tablet by mouth once.   Yes [provider]  omeprazole (PRILOSEC OTC) 20 MG tablet Take 20 mg by mouth daily as needed (for acid reflex).   Yes [provider]  Probiotic Product (PROBIOTIC DAILY) CAPS Take 1 capsule by mouth once.   Yes [provider]  Triprolidine-Pseudoephedrine (ANTIHISTAMINE PO) Take 1 tablet by mouth once.   Yes [provider]     Results for orders placed or performed during the hospital encounter of 12/27/18 (from the past 48 hour(s))  Lipase, blood     Status: Abnormal   Collection Time: 12/27/18 10:03 PM  Result Value Ref Range   Lipase 209 (H) 11 - 51 U/L    Comment: POST-ULTRACENTRIFUGATION Performed at [facility], [address] [place], [place] [address]   Comprehensive metabolic panel     Status: Abnormal   Collection Time: 12/27/18 10:03 PM  Result Value Ref Range   Sodium 133 (L) 135 - 145 mmol/L    Comment: POST-ULTRACENTRIFUGATION   Potassium 3.5 3.5 - 5.1 mmol/L   Chloride 99 98 - 111 mmol/L   CO2 18 (L) 22 - 32 mmol/L   Glucose, Bld 99 70 - 99 mg/dL   BUN 14 6 - 20 mg/dL   Creatinine, Ser 6.04 (H) 0.61 - 1.24 mg/dL   Calcium 8.9 8.9 - 54.0 mg/dL   Total Protein 6.1 (L) 6.5 - 8.1 g/dL   Albumin 3.8 3.5 - 5.0 g/dL   AST 981 (H) 15 - 41 U/L   ALT 150 (H) 0 - 44 U/L   Alkaline Phosphatase 97 38 - 126 U/L   Total Bilirubin 1.1 0.3 - 1.2 mg/dL   GFR calc non Af Amer >60 >60 mL/min   GFR calc Af Amer >60 >60 mL/min   Anion gap 16 (H) 5 - 15    Comment: Performed at [facility], [address], [place], [place] [address]  CBC     Status: Abnormal   Collection Time: 12/27/18 10:03 PM  Result Value Ref Range   WBC 10.2 4.0 - 10.5 K/uL   RBC 4.12 (L) 4.22 - 5.81 MIL/uL   Hemoglobin 14.4 13.0 - 17.0 g/dL   HCT 41.4 23.9 - 53.2 %   MCV 96.1 80.0 - 100.0 fL   MCH 35.0 (H) 26.0 - 34.0 pg   MCHC 36.4 (H) 30.0 - 36.0 g/dL    Comment: CORRECTED FOR LIPEMIA   RDW 13.2 11.5 - 15.5 %   Platelets 103 (L) 150 - 400 K/uL    Comment: PLATELET COUNT CONFIRMED BY SMEAR   nRBC 0.00 0.0 - 0.2 %    Comment: Performed at [facility], [address] [place], [place] [address]  Urinalysis, Routine w reflex microscopic     Status: None   Collection Time: 12/27/18 10:03 PM  Result Value Ref Range   Color, Urine YELLOW YELLOW   APPearance CLEAR CLEAR   Specific  Gravity, Urine 1.010 1.005 - 1.030   pH 6.0 5.0 - 8.0   Glucose, UA NEGATIVE NEGATIVE mg/dL   Hgb urine dipstick NEGATIVE NEGATIVE   Bilirubin Urine NEGATIVE NEGATIVE   Ketones, ur NEGATIVE NEGATIVE mg/dL   Protein, ur NEGATIVE NEGATIVE mg/dL   Nitrite NEGATIVE NEGATIVE   Leukocytes,Ua NEGATIVE NEGATIVE    Comment: Microscopic not done on urines with negative protein, blood, leukocytes, nitrite, or glucose < 500 mg/dL. Performed at [facility], [address] [place], [place] [address]   Rapid urine drug screen (hospital performed)     Status: Abnormal   Collection Time: 12/28/18  5:04 AM  Result Value Ref Range   Opiates POSITIVE (A) NONE DETECTED   Cocaine NONE DETECTED NONE DETECTED   Benzodiazepines NONE DETECTED NONE DETECTED   Amphetamines POSITIVE (A) NONE DETECTED   Tetrahydrocannabinol POSITIVE (A) NONE DETECTED   Barbiturates NONE DETECTED NONE DETECTED    Comment: (NOTE) DRUG SCREEN FOR MEDICAL PURPOSES ONLY.  IF CONFIRMATION IS NEEDED FOR ANY PURPOSE, NOTIFY LAB WITHIN 5 DAYS. LOWEST DETECTABLE LIMITS FOR URINE DRUG SCREEN Drug Class                     Cutoff (ng/mL) Amphetamine and metabolites    1000 Barbiturate and metabolites    200 Benzodiazepine                 200 Tricyclics and metabolites     300 Opiates and metabolites        300 Cocaine and metabolites        300 THC                            50 Performed at [facility], [address], [place], [place] [address]   Urinalysis, Routine w reflex microscopic     Status: Abnormal   Collection Time: 12/28/18  5:04 AM  Result Value Ref Range   Color, Urine YELLOW YELLOW   APPearance CLEAR CLEAR   Specific Gravity, Urine 1.036 (H) 1.005 - 1.030   pH 5.0 5.0 - 8.0   Glucose, UA NEGATIVE NEGATIVE mg/dL   Hgb urine dipstick NEGATIVE NEGATIVE   Bilirubin Urine NEGATIVE NEGATIVE   Ketones, ur 5 (A) NEGATIVE mg/dL   Protein, ur NEGATIVE NEGATIVE mg/dL   Nitrite NEGATIVE  NEGATIVE  Leukocytes,Ua NEGATIVE NEGATIVE    Comment: Performed at [facility], [address] [place], [place] [address]  HIV antibody (Routine Testing)     Status: None   Collection Time: 12/28/18  5:24 AM  Result Value Ref Range   HIV Screen 4th Generation wRfx Non Reactive Non Reactive    Comment: (NOTE) Performed At: [facility] [address] [place], [place] [id number] Jolene Schimke MD QI:[id number]   CBC     Status: Abnormal   Collection Time: 12/28/18  5:24 AM  Result Value Ref Range   WBC 8.6 4.0 - 10.5 K/uL   RBC 4.29 4.22 - 5.81 MIL/uL   Hemoglobin 14.8 13.0 - 17.0 g/dL   HCT 32.4 40.1 - 02.7 %   MCV 102.1 (H) 80.0 - 100.0 fL   MCH 34.5 (H) 26.0 - 34.0 pg   MCHC 33.8 30.0 - 36.0 g/dL    Comment: CORRECTED FOR LIPEMIA   RDW 13.6 11.5 - 15.5 %   Platelets 92 (L) 150 - 400 K/uL    Comment: PLATELET COUNT CONFIRMED BY SMEAR Immature Platelet Fraction may be clinically indicated, consider ordering this additional test [id number]    nRBC 0.0 0.0 - 0.2 %    Comment: Performed at [facility], [address], [place], [place] [address]  Comprehensive metabolic panel     Status: Abnormal   Collection Time: 12/28/18  5:24 AM  Result Value Ref Range   Sodium 137 135 - 145 mmol/L    Comment: POST-ULTRACENTRIFUGATION   Potassium 3.5 3.5 - 5.1 mmol/L   Chloride 109 98 - 111 mmol/L   CO2 16 (L) 22 - 32 mmol/L   Glucose, Bld 108 (H) 70 - 99 mg/dL   BUN 6 6 - 20 mg/dL   Creatinine, Ser 5.95 0.61 - 1.24 mg/dL   Calcium 7.0 (L) 8.9 - 10.3 mg/dL   Total Protein 5.5 (L) 6.5 - 8.1 g/dL   Albumin 3.2 (L) 3.5 - 5.0 g/dL   AST 638 (H) 15 - 41 U/L   ALT 117 (H) 0 - 44 U/L   Alkaline Phosphatase 78 38 - 126 U/L   Total Bilirubin 1.9 (H) 0.3 - 1.2 mg/dL   GFR calc non Af Amer >60 >60 mL/min   GFR calc Af Amer >60 >60 mL/min   Anion gap 12 5 - 15    Comment: Performed at [facility], [address] [place], [place]  [address]  Magnesium     Status: None   Collection Time: 12/28/18  5:24 AM  Result Value Ref Range   Magnesium 1.7 1.7 - 2.4 mg/dL    Comment: Performed at [facility], [address] [place], [place] [address]  Phosphorus     Status: None   Collection Time: 12/28/18  5:24 AM  Result Value Ref Range   Phosphorus 2.9 2.5 - 4.6 mg/dL    Comment: Performed at [facility], [address] [place], [place] [address]  Lipase, blood     Status: Abnormal   Collection Time: 12/28/18  5:24 AM  Result Value Ref Range   Lipase 840 (H) 11 - 51 U/L    Comment: RESULTS CONFIRMED BY MANUAL DILUTION Performed at [facility], [address], [place], [place] [address]   Protime-INR     Status: None   Collection Time: 12/28/18  5:24 AM  Result Value Ref Range   Prothrombin Time 12.4 11.4 - 15.2  seconds   INR 0.9 0.8 - 1.2    Comment: (NOTE) INR goal varies based on device and disease states. Performed at [facility], [address] [place], [place] [address]   Hemoglobin A1c     Status: None   Collection Time: 12/28/18  5:24 AM  Result Value Ref Range   Hgb A1c MFr Bld 4.9 4.8 - 5.6 %    Comment: (NOTE) Pre diabetes:          5.7%-6.4% Diabetes:              >6.4% Glycemic control for   <7.0% adults with diabetes    Mean Plasma Glucose 93.93 mg/dL    Comment: Performed at [facility], [address] [place], [place] [address]  Lipid panel     Status: Abnormal   Collection Time: 12/28/18  5:24 AM  Result Value Ref Range   Cholesterol 1,108 (H) 0 - 200 mg/dL    Comment: RESULTS CONFIRMED BY MANUAL DILUTION LIPEMIC SPECIMEN    Triglycerides >5,000 (H) <150 mg/dL    Comment: RESULTS CONFIRMED BY MANUAL DILUTION LIPEMIC SPECIMEN    HDL 11 (L) >40 mg/dL   Total CHOL/HDL Ratio NOT REPORTED DUE TO HIGH TRIGLYCERIDES RATIO   VLDL UNABLE TO CALCULATE IF TRIGLYCERIDE OVER 400 mg/dL 0 - 40 mg/dL   LDL Cholesterol UNABLE TO CALCULATE IF  TRIGLYCERIDE OVER 400 mg/dL 0 - 99 mg/dL    Comment: Performed at [facility], [address] [place], [place] [address]  Gamma GT     Status: Abnormal   Collection Time: 12/28/18  5:24 AM  Result Value Ref Range   GGT 1,012 (H) 7 - 50 U/L    Comment: Performed at [facility], [address] [place], [place] [address]  Ethanol     Status: None   Collection Time: 12/28/18  7:03 PM  Result Value Ref Range   Alcohol, Ethyl (B) <10 <10 mg/dL    Comment: Performed at [facility], [address], [place], [place] [address]    Dg Abd 1 View  Result Date: 12/29/2018 CLINICAL DATA:  Patient with generalized abdominal pain. EXAM: ABDOMEN - 1 VIEW COMPARISON:  CT abdomen pelvis 12/27/2018 FINDINGS: Multiple gaseous distended loops of small bowel are demonstrated within the abdomen measuring up to 5 cm. Small amount of colonic gas is present. Supine evaluation limited for the detection of free intraperitoneal air. IMPRESSION: Gaseous distended loops of small bowel throughout the abdomen with small amount of distal colonic gas. Findings may represent early/partial obstruction or ileus. Electronically Signed   By: Annia Belt M.D.   On: 12/29/2018 10:35   Ct Abdomen Pelvis W Contrast  Result Date: 12/28/2018 CLINICAL DATA:  Severe mid abdominal pain and the umbilicus. Tenderness. Nausea, vomiting, and diarrhea. Symptoms for 3 days. EXAM: CT ABDOMEN AND PELVIS WITH CONTRAST TECHNIQUE: Multidetector CT imaging of the abdomen and pelvis was performed using the standard protocol following bolus administration of intravenous contrast. CONTRAST:  OMNIPAQUE IOHEXOL 300 MG/ML  SOLN COMPARISON:  Ultrasound right upper quadrant 12/27/2018 FINDINGS: Lower chest: Lung bases are clear. Hepatobiliary: Diffuse fatty infiltration of the liver. No focal liver lesions. Gallbladder and bile ducts are unremarkable. Pancreas: Infiltration and edema around the head and body of the pancreas.  No loculated fluid collections. Pancreatic parenchyma is homogeneous with normal enhancement. Changes are consistent with acute edematous interstitial pancreatitis. No pancreatic ductal dilatation. Spleen: Normal in size  without focal abnormality. Adrenals/Urinary Tract: Adrenal glands are unremarkable. Kidneys are normal, without renal calculi, focal lesion, or hydronephrosis. Bladder wall is diffusely thickened possibly indicating cystitis. Stomach/Bowel: Stomach, small bowel, and colon are not abnormally distended. There is mild edema in the colonic wall at the hepatic flexure, likely representing reactive inflammation. Appendix is normal. Vascular/Lymphatic: No significant vascular findings are present. No enlarged abdominal or pelvic lymph nodes. Reproductive: Prostate is unremarkable. Other: No free air or free fluid in the abdomen. Abdominal wall musculature appears intact. Musculoskeletal: No acute or significant osseous findings. IMPRESSION: 1. Infiltration and edema around the head and body of the pancreas consistent with acute edematous interstitial pancreatitis. No loculated fluid collections. No evidence of pancreatic necrosis. 2. Diffuse fatty infiltration of the liver. 3. Bladder wall thickening may indicate cystitis. Electronically Signed   By: Burman Nieves M.D.   On: 12/28/2018 00:18   US Abdomen Limited Ruq  Result Date: 12/27/2018 CLINICAL DATA:  28 year old male with right upper quadrant abdominal pain for 2 months, but progressed in the past 2 days. EXAM: ULTRASOUND ABDOMEN LIMITED RIGHT UPPER QUADRANT COMPARISON:  None. FINDINGS: Gallbladder: No gallstones or wall thickening visualized. No sonographic Murphy sign noted by sonographer. Common bile duct: Diameter: 3 millimeters, normal. Liver: Echogenic liver (image 15). No discrete liver lesion. No intrahepatic biliary ductal dilatation. Portal vein is patent on color Doppler imaging with normal direction of blood flow towards the liver.  Other findings: Negative visible right kidney. IMPRESSION: 1. Hepatic steatosis. 2. Negative gallbladder.  No evidence of biliary obstruction. Electronically Signed   By: Odessa Fleming M.D.   On: 12/27/2018 23:26    Review of Systems  Constitutional: Negative for weight loss.  HENT: Negative for ear discharge, ear pain, hearing loss and tinnitus.   Eyes: Negative for blurred vision, double vision, photophobia and pain.  Respiratory: Negative for cough, sputum production and shortness of breath.   Cardiovascular: Negative for chest pain.  Gastrointestinal: Positive for abdominal pain, nausea and vomiting.  Genitourinary: Negative for dysuria, flank pain, frequency and urgency.  Musculoskeletal: Negative for back pain, falls, joint pain, myalgias and neck pain.  Neurological: Negative for dizziness, tingling, sensory change, focal weakness, loss of consciousness and headaches.  Endo/Heme/Allergies: Does not bruise/bleed easily.  Psychiatric/Behavioral: Negative for depression, memory loss and substance abuse. The patient is not nervous/anxious.    Blood pressure 124/69, pulse (!) 127, temperature 97.7 F (36.5 C), temperature source Axillary, resp. rate 19, height  (1.778 m), weight 69.7 kg, SpO2 96 %. Physical Exam WDWN in NAD Eyes:  Pupils equal, round; sclera anicteric HENT:  Oral mucosa moist; good dentition  Neck:  No masses palpated, no thyromegaly Lungs:  CTA bilaterally; normal respiratory effort CV:  Regular rate and rhythm; no murmurs; extremities well-perfused with no edema Abd:  +bowel sounds,distended, mild generalized tenderness Skin:  Warm, dry; no sign of jaundice Psychiatric - alert and oriented x 4; calm mood and affect    Assessment/Plan: Probable alcoholic pancreatitis Secondary ileus - CT shows secondary inflammation of the hepatic flexure of the colon, which may be causing ileus/ functional obstruction  NPO NG tube to ILWS IV hydration  We will follow with  you.  Wilmon Arms Rhetta Cleek 12/29/2018, 12:05 PM

## 2018-12-29 NOTE — Progress Notes (Signed)
CRITICAL VALUE ALERT  Critical Value:  Calcium 4.1  Date & Time Notied:  12/29/18 at 4:44 pm  Provider Notified: A. Adhikari, MD  Orders Received/Actions taken: Calcium Gluconate 1,000 mg IVPB

## 2018-12-30 ENCOUNTER — Inpatient Hospital Stay (HOSPITAL_COMMUNITY): Payer: Federal, State, Local not specified - PPO

## 2018-12-30 DIAGNOSIS — E781 Pure hyperglyceridemia: Secondary | ICD-10-CM | POA: Diagnosis present

## 2018-12-30 DIAGNOSIS — F1910 Other psychoactive substance abuse, uncomplicated: Secondary | ICD-10-CM

## 2018-12-30 DIAGNOSIS — K567 Ileus, unspecified: Secondary | ICD-10-CM

## 2018-12-30 DIAGNOSIS — F1010 Alcohol abuse, uncomplicated: Secondary | ICD-10-CM

## 2018-12-30 LAB — BASIC METABOLIC PANEL
Anion gap: 8 (ref 5–15)
BUN: 20 mg/dL (ref 6–20)
CALCIUM: 5.4 mg/dL — AB (ref 8.9–10.3)
CO2: 15 mmol/L — ABNORMAL LOW (ref 22–32)
Chloride: 109 mmol/L (ref 98–111)
Creatinine, Ser: 1.12 mg/dL (ref 0.61–1.24)
GFR calc Af Amer: >60 mL/min (ref >60)
Glucose, Bld: 52 mg/dL — ABNORMAL LOW (ref 70–99)
Potassium: 5.4 mmol/L — ABNORMAL HIGH (ref 3.5–5.1)
Sodium: 132 mmol/L — ABNORMAL LOW (ref 135–145)

## 2018-12-30 LAB — CBC WITH DIFFERENTIAL/PLATELET
Abs Immature Granulocytes: 0.12 10*3/uL — ABNORMAL HIGH (ref 0.00–0.07)
Basophils Absolute: 0.1 10*3/uL (ref 0.0–0.1)
Basophils Relative: 1 %
Eosinophils Absolute: 0.1 10*3/uL (ref 0.0–0.5)
Eosinophils Relative: 1 %
HCT: 34.9 % — ABNORMAL LOW (ref 39.0–52.0)
Hemoglobin: 11.9 g/dL — ABNORMAL LOW (ref 13.0–17.0)
Immature Granulocytes: 1 %
LYMPHS ABS: 1.4 10*3/uL (ref 0.7–4.0)
LYMPHS PCT: 16 %
MCH: 34.8 pg — ABNORMAL HIGH (ref 26.0–34.0)
MCHC: 34.1 g/dL (ref 30.0–36.0)
MCV: 102 fL — ABNORMAL HIGH (ref 80.0–100.0)
Monocytes Absolute: 0.5 10*3/uL (ref 0.1–1.0)
Monocytes Relative: 6 %
Neutro Abs: 6.5 10*3/uL (ref 1.7–7.7)
Neutrophils Relative %: 75 %
Platelets: 95 10*3/uL — ABNORMAL LOW (ref 150–400)
RBC: 3.42 MIL/uL — ABNORMAL LOW (ref 4.22–5.81)
RDW: 14.2 % (ref 11.5–15.5)
WBC: 8.6 10*3/uL (ref 4.0–10.5)
nRBC: 0 % (ref 0.0–0.2)

## 2018-12-30 LAB — GLUCOSE, CAPILLARY
GLUCOSE-CAPILLARY: 104 mg/dL — AB (ref 70–99)
GLUCOSE-CAPILLARY: 139 mg/dL — AB (ref 70–99)
Glucose-Capillary: 106 mg/dL — ABNORMAL HIGH (ref 70–99)
Glucose-Capillary: 108 mg/dL — ABNORMAL HIGH (ref 70–99)
Glucose-Capillary: 151 mg/dL — ABNORMAL HIGH (ref 70–99)
Glucose-Capillary: 50 mg/dL — ABNORMAL LOW (ref 70–99)
Glucose-Capillary: 51 mg/dL — ABNORMAL LOW (ref 70–99)
Glucose-Capillary: 53 mg/dL — ABNORMAL LOW (ref 70–99)
Glucose-Capillary: 55 mg/dL — ABNORMAL LOW (ref 70–99)
Glucose-Capillary: 57 mg/dL — ABNORMAL LOW (ref 70–99)
Glucose-Capillary: 67 mg/dL — ABNORMAL LOW (ref 70–99)
Glucose-Capillary: 75 mg/dL (ref 70–99)
Glucose-Capillary: 77 mg/dL (ref 70–99)
Glucose-Capillary: 83 mg/dL (ref 70–99)
Glucose-Capillary: 88 mg/dL (ref 70–99)

## 2018-12-30 LAB — OCCULT BLOOD X 1 CARD TO LAB, STOOL: Fecal Occult Bld: NEGATIVE

## 2018-12-30 LAB — HEMOGLOBIN A1C
Hgb A1c MFr Bld: 4.9 % (ref 4.8–5.6)
Mean Plasma Glucose: 93.93 mg/dL

## 2018-12-30 LAB — COMPREHENSIVE METABOLIC PANEL
ALK PHOS: 46 U/L (ref 38–126)
ALT: 44 U/L (ref 0–44)
ANION GAP: 8 (ref 5–15)
AST: 86 U/L — ABNORMAL HIGH (ref 15–41)
Albumin: 1.9 g/dL — ABNORMAL LOW (ref 3.5–5.0)
BUN: 20 mg/dL (ref 6–20)
CO2: 14 mmol/L — ABNORMAL LOW (ref 22–32)
Calcium: 4.8 mg/dL — CL (ref 8.9–10.3)
Chloride: 111 mmol/L (ref 98–111)
Creatinine, Ser: 1.29 mg/dL — ABNORMAL HIGH (ref 0.61–1.24)
GFR calc Af Amer: >60 mL/min (ref >60)
GFR calc non Af Amer: >60 mL/min (ref >60)
Glucose, Bld: 119 mg/dL — ABNORMAL HIGH (ref 70–99)
Potassium: 5.2 mmol/L — ABNORMAL HIGH (ref 3.5–5.1)
SODIUM: 133 mmol/L — AB (ref 135–145)
Total Bilirubin: 2.5 mg/dL — ABNORMAL HIGH (ref 0.3–1.2)
Total Protein: 4.3 g/dL — ABNORMAL LOW (ref 6.5–8.1)

## 2018-12-30 LAB — TRIGLYCERIDES
Triglycerides: >5000 mg/dL — ABNORMAL HIGH (ref <150)
Triglycerides: 4430 mg/dL — ABNORMAL HIGH (ref <150)

## 2018-12-30 LAB — LACTIC ACID, PLASMA: Lactic Acid, Venous: 1.5 mmol/L (ref 0.5–1.9)

## 2018-12-30 LAB — PHOSPHORUS: Phosphorus: 1.5 mg/dL — ABNORMAL LOW (ref 2.5–4.6)

## 2018-12-30 LAB — TSH: TSH: 2.923 u[IU]/mL (ref 0.350–4.500)

## 2018-12-30 LAB — MAGNESIUM: Magnesium: 2.5 mg/dL — ABNORMAL HIGH (ref 1.7–2.4)

## 2018-12-30 LAB — MRSA PCR SCREENING: MRSA by PCR: NEGATIVE

## 2018-12-30 LAB — LIPASE, BLOOD: Lipase: 142 U/L — ABNORMAL HIGH (ref 11–51)

## 2018-12-30 MED ORDER — MORPHINE SULFATE (PF) 2 MG/ML IV SOLN
1.0000 mg | INTRAVENOUS | Status: DC | PRN
  Administered 2018-12-30 – 2019-01-01 (×13): 3 mg via INTRAVENOUS
  Administered 2019-01-01: 2 mg via INTRAVENOUS
  Administered 2019-01-01 – 2019-01-03 (×12): 3 mg via INTRAVENOUS
  Filled 2018-12-30 (×3): qty 2
  Filled 2018-12-30: qty 1
  Filled 2018-12-30 (×22): qty 2

## 2018-12-30 MED ORDER — DEXTROSE 10 % IV SOLN
INTRAVENOUS | Status: DC
  Administered 2018-12-30 – 2018-12-31 (×2): via INTRAVENOUS

## 2018-12-30 MED ORDER — INSULIN (MYXREDLIN) INFUSION FOR HYPERTRIGLYCERIDEMIA
0.2000 [IU]/kg/h | INTRAVENOUS | Status: DC
  Administered 2018-12-30 (×2): 0.2 [IU]/kg/h via INTRAVENOUS
  Filled 2018-12-30 (×2): qty 100

## 2018-12-30 MED ORDER — INSULIN REGULAR BOLUS VIA INFUSION
0.0000 [IU] | Freq: Three times a day (TID) | INTRAVENOUS | Status: DC
  Filled 2018-12-30: qty 10

## 2018-12-30 MED ORDER — CALCIUM GLUCONATE-NACL 1-0.675 GM/50ML-% IV SOLN
1.0000 g | Freq: Once | INTRAVENOUS | Status: DC
  Filled 2018-12-30: qty 50

## 2018-12-30 MED ORDER — DEXTROSE 5 % IV SOLN
INTRAVENOUS | Status: DC
  Administered 2018-12-30: 12:00:00 via INTRAVENOUS

## 2018-12-30 MED ORDER — INSULIN (MYXREDLIN) INFUSION FOR HYPERTRIGLYCERIDEMIA
0.1000 [IU]/kg/h | INTRAVENOUS | Status: DC
  Filled 2018-12-30: qty 100

## 2018-12-30 MED ORDER — DEXTROSE 50 % IV SOLN
25.0000 g | INTRAVENOUS | Status: AC
  Administered 2018-12-30: 25 g via INTRAVENOUS
  Filled 2018-12-30: qty 50

## 2018-12-30 MED ORDER — DEXTROSE 50 % IV SOLN
12.5000 g | INTRAVENOUS | Status: DC
  Filled 2018-12-30: qty 50

## 2018-12-30 MED ORDER — DEXTROSE-NACL 5-0.9 % IV SOLN: INTRAVENOUS | Status: DC

## 2018-12-30 MED ORDER — INSULIN REGULAR(HUMAN) IN NACL 100-0.9 UT/100ML-% IV SOLN: INTRAVENOUS | Status: DC

## 2018-12-30 MED ORDER — METOPROLOL TARTRATE 5 MG/5ML IV SOLN
5.0000 mg | Freq: Three times a day (TID) | INTRAVENOUS | Status: DC
  Administered 2018-12-30 (×2): 5 mg via INTRAVENOUS
  Filled 2018-12-30 (×2): qty 5

## 2018-12-30 MED ORDER — SODIUM CHLORIDE 0.9 % IV SOLN
INTRAVENOUS | Status: DC
  Administered 2018-12-31: 08:00:00 via INTRAVENOUS

## 2018-12-30 MED ORDER — DEXTROSE 50 % IV SOLN
25.0000 g | INTRAVENOUS | Status: AC
  Administered 2018-12-30: 25 g via INTRAVENOUS

## 2018-12-30 MED ORDER — LABETALOL HCL 5 MG/ML IV SOLN
5.0000 mg | INTRAVENOUS | Status: DC | PRN
  Administered 2018-12-30 – 2019-01-09 (×9): 5 mg via INTRAVENOUS
  Filled 2018-12-30 (×9): qty 4

## 2018-12-30 MED ORDER — DEXTROSE 50 % IV SOLN
25.0000 mL | INTRAVENOUS | Status: DC | PRN
  Administered 2018-12-30 – 2018-12-31 (×5): 25 mL via INTRAVENOUS
  Filled 2018-12-30 (×7): qty 50

## 2018-12-30 MED ORDER — CHLORHEXIDINE GLUCONATE CLOTH 2 % EX PADS
6.0000 | MEDICATED_PAD | Freq: Every day | CUTANEOUS | Status: DC
  Administered 2018-12-30 – 2019-01-12 (×11): 6 via TOPICAL

## 2018-12-30 MED ORDER — CALCIUM GLUCONATE-NACL 2-0.675 GM/100ML-% IV SOLN
2.0000 g | Freq: Once | INTRAVENOUS | Status: AC
  Administered 2018-12-30: 2000 mg via INTRAVENOUS
  Filled 2018-12-30: qty 100

## 2018-12-30 NOTE — Progress Notes (Addendum)
MD made aware of patient's BP ranging 140s-160s (systolic). PRN pain medication administered, but BP remained elevated. Patient rated pain 8/10 with movement. MD also made aware of follow-up CBG post initial insulin drip administration. CBG 75, orders to administer D50 only if CBG below 70. Awaiting further orders. Will continue to monitor.

## 2018-12-30 NOTE — Progress Notes (Signed)
[facility]:  [phone] General Surgery Progress Note   LOS: 2 days  POD -     Chief Complaint: Abdominal pain  Assessment and Plan: 1.  Ileus  CT shows secondary inflammation of the hepatic flexure of the colon, which may be causing ileus/ functional obstruction.  Still distended with no flatus.  Needs to ambulate.  Will check KUB tomorrow AM.  2.  Pancreatitis  Lipase - 840 - 12/28/2018  This may part in due to EtOH, but also possibly due to hypertriglyceridemia. 3.  Hypertriglyceridemia  Triglycerides > 5,000 - 12/30/2018 4.  Polysubstance abuse 5.  Creatinine - 1.29 - 12/30/2018 6.  Malnutrtiion  Albumin - 1.9 - 12/30/2018  To check prealbumin 7.  Hypocalcemia  In part due to low albumin  8.  DVT prophylaxis - Lovenox   Active Problems:   Acute alcoholic pancreatitis   Pancreatitis   ETOH abuse   Drug abuse (HCC)   Cystitis  Subjective:  Still distended and feels like he has to pass flatus.  Needs to get out of bed and walkd.  Objective:   Vitals:   12/30/18 0226 12/30/18 0507  BP: (!) 138/97 (!) 136/91  Pulse: (!) 101 (!) 102  Resp:  16  Temp:  98.2 F (36.8 C)  SpO2:  97%     Intake/Output from previous day:  03/15 0701 - 03/16 0700 In: 4664.3 [I.V.:4623.2; IV Piggyback:41.1] Out: 500 [Urine:300; Emesis/NG output:200]  Intake/Output this shift:  No intake/output data recorded.   Physical Exam:   General: WN WM who is alert and oriented.    HEENT: Normal. Pupils equal.  Has NGT in place. .   Lungs: Clear.   Abdomen: Distended.  Decreased bowel sounds.  Non tender.     Lab Results:    Recent Labs    12/28/18 0524 12/29/18 1116  WBC 8.6 7.6  HGB 14.8 16.6  HCT 43.8 45.0  PLT 92* 84*    BMET   Recent Labs    12/29/18 1116 12/30/18 0504  NA 132* 133*  K 4.3 5.2*  CL 110 111  CO2 11* 14*  GLUCOSE 177* 119*  BUN 18 20  CREATININE 1.79* 1.29*  CALCIUM 4.1* 4.8*    PT/INR   Recent Labs    12/28/18 0524   LABPROT 12.4  INR 0.9    ABG  No results for input(s): PHART, HCO3 in the last 72 hours.  Invalid input(s): PCO2, PO2   Studies/Results:  Dg Abd 1 View  Result Date: 12/29/2018 CLINICAL DATA:  Evaluate NG tube placement EXAM: ABDOMEN - 1 VIEW COMPARISON:  December 24, 2018 FINDINGS: The NG tube terminates in the gastric antrum. No free air, portal venous gas, or pneumatosis. Dilated loops of small bowel are identified with a paucity of colonic gas. IMPRESSION: 1. The NG tube is in good position. 2. Dilated small bowel loops with a paucity of colonic gas are most consistent with a bowel obstruction. Electronically Signed   By: Gerome Sam III M.D   On: 12/29/2018 14:11   Dg Abd 1 View  Result Date: 12/29/2018 CLINICAL DATA:  Patient with generalized abdominal pain. EXAM: ABDOMEN - 1 VIEW COMPARISON:  CT abdomen pelvis 12/27/2018 FINDINGS: Multiple gaseous distended loops of small bowel are demonstrated within the abdomen measuring up to 5 cm. Small amount of colonic gas is present. Supine evaluation limited for the detection of free intraperitoneal air. IMPRESSION: Gaseous distended loops of small bowel throughout the abdomen with small  amount of distal colonic gas. Findings may represent early/partial obstruction or ileus. Electronically Signed   By: Annia Belt M.D.   On: 12/29/2018 10:35     Anti-infectives:   Anti-infectives (From admission, onward)   None      Ovidio Kin, MD, FACS Pager: [phone] [facility] Office: [phone] 12/30/2018

## 2018-12-30 NOTE — Progress Notes (Signed)
Hypoglycemic Event  CBG: 67  Treatment:88mL of Dextrose 5-%  Symptoms: Asymptomatic  Follow-up CBG: Time: 2336 CBG Result: 106  Possible Reasons for Event: Medication - on insulin drip  Comments/MD notified: [name] made aware    Sindy Mccune

## 2018-12-30 NOTE — Progress Notes (Addendum)
Pt. BS have been in the 50's every hour RN has given pt. 54ml of D50 each time. PT. Is asymptomatic and BS does increase to above 70's when given D50. RN has spoke with MD many times we have increased his D5 2 times changed his solution to D10 and decreased his insulin drip to be 0.1 units/kg/hr and continuing to use his admission weight of 69.7kg.  MD will re-evaluate tomorrow. RN will continue to monitor closely.

## 2018-12-30 NOTE — Progress Notes (Signed)
CRITICAL VALUE ALERT  Critical Value:  Calcium 5.4  Date & Time Notied:  12/30/18 1902  Provider Notified: Not notified because lab value is trending up.  Orders Received/Actions taken: No new orders will continue to monitor

## 2018-12-30 NOTE — Consult Note (Addendum)
Referring Provider: Dr. Tawanna Solo Primary Care Physician:  Patient, No Pcp Per Primary Gastroenterologist:  Althia Forts   Reason for Consultation: EtOH pancreatitis   HPI: Charles Dougherty is a 28 y.o. male with a remote history of asthma, history of EtOH abuse and poly substance abuse who presented to the ED 12/28/2018 with abdominal pain and shortness of breath. He reported having epigastric and right upper quadrant pain for the past 2 months which progressively worsened over the past few days. His abdominal pain was severe and he began to vomit nonbilious emesis on 12/28/2018. He passed darker formed stool with red blood and a few blood clots.  He previously noted a small amount of red blood on the toilet tissue after passing a BM for the past 2 weeks. He reports drinking 1 beer during the weekdays and 10+ beers Friday and Saturday evenings. Often skips meals.    ED course: Sodium 137.  Potassium 3.5.  Chloride 109.  CO2 16.  Glucose 108.  BUN 9.  Creatinine 1.10.  Calcium 7.0.  Phosphorus 2.9.  Magnesium 1.7.  Alk phos 78.  Albumin 3.2.  Lipase 840.  AST 105.  ALT 117.  Total bilirubin 1.9.  GGT 1012.  Triglyceride greater than 5000.  WBC 8.6.  Hemoglobin 14.8.  Hematocrit 43.8.  Platelet 92.  HIV negative.  Urine tox positive for amphetamines, opiates and THC.  Abdominal/pelvic CT with IV contrast: Diffuse fatty infiltration of the liver, no focal lesions.  Gallbladder and bile ducts unremarkable. Pancreas with infiltration and edema around the head of the body of the pancreas, no loculated fluid collections, changes are consistent with acute edematous interstitial pancreatitis. No evidence of pancreatic necrosis. No pancreatic ductal dilatation.  The spleen was normal.  Mild edema in the colonic wall at the hepatic flexure, appendix was normal.  The wall is diffusely thickened possible cystitis.  Abdominal x-ray 1 view 12/29/2018 at 10:35 AM: Gaseous distended loop of small bowel throughout the  abdomen with small amount of distal colonic gas, possible early/partial obstruction or ileus Abdominal  x-ray post NG tube placement: NG tube in good position, dilated small bowel loops with a paucity of colonic gas consistent with a bowel obstruction  Surgery was consulted, patient was placed n.p.o. with NG tube to low intermittent suction  Patient was transferred to the ICU stepdown for insulin drip.  Calcium level critically low 4.1, 4.8  Father with history of elevated Triglycerides   Past Medical History:  Diagnosis Date  . Allergy   . Asthma   . Atopic eczema   . Cystitis 12/28/2018  . Drug abuse ([place]) 12/28/2018  . ETOH abuse 12/28/2018    History reviewed. No pertinent surgical history.  Prior to Admission medications   Medication Sig Start Date End Date Taking? Authorizing Provider  calcium carbonate (TUMS - DOSED IN MG ELEMENTAL CALCIUM) 500 MG chewable tablet Chew 1 tablet by mouth as needed for indigestion or heartburn.   Yes [provider]  famotidine-calcium carbonate-magnesium hydroxide (PEPCID COMPLETE) 10-800-165 MG chewable tablet Chew 1 tablet by mouth once.   Yes [provider]  omeprazole (PRILOSEC OTC) 20 MG tablet Take 20 mg by mouth daily as needed (for acid reflex).   Yes [provider]  Probiotic Product (PROBIOTIC DAILY) CAPS Take 1 capsule by mouth once.   Yes [provider]  Triprolidine-Pseudoephedrine (ANTIHISTAMINE PO) Take 1 tablet by mouth once.   Yes [provider]    Current Facility-Administered Medications  Medication Dose Route  Frequency Provider Last Rate Last Dose  . 0.9 %  sodium chloride infusion   Intravenous Continuous Adhikari, Amrit, MD      . dextrose 5 % solution   Intravenous Continuous Adhikari, Amrit, MD      . dextrose 50 % solution 25 mL  25 mL Intravenous PRN Adhikari, Amrit, MD      . enoxaparin (LOVENOX) injection 40 mg  40 mg Subcutaneous Q24H Allie Bossier, MD   40 mg at  12/29/18 1036  . insulin (MYXREDLIN) 100 units/100 mL infusion for hypertriglyceridemia-induced pancreatitis  0.2 Units/kg/hr Intravenous Continuous Adhikari, Amrit, MD      . morphine 2 MG/ML injection 1-3 mg  1-3 mg Intravenous Q4H PRN Allie Bossier, MD   2 mg at 12/30/18 7169  . ondansetron (ZOFRAN) tablet 4 mg  4 mg Oral Q6H PRN Allie Bossier, MD       Or  . ondansetron [facility]) injection 4 mg  4 mg Intravenous Q6H PRN Allie Bossier, MD   4 mg at 12/29/18 0156  . phenol (CHLORASEPTIC) mouth spray 1 spray  1 spray Mouth/Throat PRN Shelly Coss, MD   1 spray at 12/29/18 1629  . zolpidem (AMBIEN) tablet 5 mg  5 mg Oral QHS PRN,MR X 1 Allie Bossier, MD   5 mg at 12/28/18 0630    Allergies as of 12/27/2018  . (No Known Allergies)    Family History  Problem Relation Age of Onset  . Hypertension Mother   . Hyperlipidemia Mother   . Nephrolithiasis Father   . GER disease Brother     Social History   Socioeconomic History  . Marital status: Single    Spouse name: Not on file  . Number of children: 0  . Years of education: Not on file  . Highest education level: Not on file  Occupational History  . Occupation: Ship broker    Comment: Archivist  Social Needs  . Financial resource strain: Not on file  . Food insecurity:    Worry: Not on file    Inability: Not on file  . Transportation needs:    Medical: Not on file    Non-medical: Not on file  Tobacco Use  . Smoking status: Current Every Day Smoker    Types: Cigarettes  . Smokeless tobacco: Never Used  Substance and Sexual Activity  . Alcohol use: Yes    Comment: weekly  . Drug use: Yes    Types: Marijuana  . Sexual activity: Not on file  Lifestyle  . Physical activity:    Days per week: Not on file    Minutes per session: Not on file  . Stress: Not on file  Relationships  . Social connections:    Talks on phone: Not on file    Gets together: Not on file    Attends  religious service: Not on file    Active member of club or organization: Not on file    Attends meetings of clubs or organizations: Not on file    Relationship status: Not on file  . Intimate partner violence:    Fear of current or ex partner: Not on file    Emotionally abused: Not on file    Physically abused: Not on file    Forced sexual activity: Not on file  Other Topics Concern  . Not on file  Social History Narrative  . Not on file    Review of Systems: Gen: No fever, sweats  or chills CV: No chest pain or palpitations.  Resp:No further SOB, no cough.  GI: see HPI GU : Denies urinary burning or hematuria. MS: No new joint pain or muscle aches.  Derm: Denies rash or skin lesions. Heme: Denies bruising, bleeding, and enlarged lymph nodes. Neuro:  Denies any headaches, dizziness, paresthesias. Endo:  Denies any problems with DM, thyroid, adrenal function.  Physical Exam: Vital signs in last 24 hours: Temp:  [97.7 F (36.5 C)-98.2 F (36.8 C)] 97.7 F (36.5 C) (03/16 1100) Pulse Rate:  [101-127] 102 (03/16 0507) Resp:  [14-17] 16 (03/16 0507) BP: (123-160)/(69-106) 160/106 (03/16 1100) SpO2:  [95 %-100 %] 98 % (03/16 1100) Last BM Date: 12/27/18 General:   Alert and oriented  Head:  Normocephalic and atraumatic. Eyes:  Sclera clear, no icterus.   Conjunctiva pink. Ears:  Normal auditory acuity. Nose:  No deformity, discharge,  or lesions. Mouth:  No deformity or lesions.   Neck:  Supple; no masses or thyromegaly. Lungs:  Clear throughout to auscultation.   No wheezes, crackles, or rhonchi.  Heart:  Regular rate and rhythm; no murmurs, clicks, rubs,  or gallops. Abdomen:  Soft,nontender, BS active,nonpalp mass or hsm.   Rectal: Non inflamed right external hemorrhoid, small amount of dark brown solid stool in the rectum, spec sent for guaiac testing.  Msk:  Symmetrical without gross deformities. . Pulses:  Normal pulses noted. Extremities:  Without clubbing or edema.  Neurologic:  Alert and  oriented x4;  grossly normal neurologically. Skin:  Intact without significant lesions or rashes.. Psych:  Alert and cooperative. Normal mood and affect.  Intake/Output from previous day: 03/15 0701 - 03/16 0700 In: 4664.3 [I.V.:4623.2; IV Piggyback:41.1] Out: 500 [Urine:300; Emesis/NG output:200] Intake/Output this shift: Total I/O In: -  Out: 200 [Urine:200]  Lab Results: Recent Labs    12/27/18 2203 12/28/18 0524 12/29/18 1116  WBC 10.2 8.6 7.6  HGB 14.4 14.8 16.6  HCT 39.6 43.8 45.0  PLT 103* 92* 84*   BMET Recent Labs    12/28/18 0524 12/29/18 1116 12/30/18 0504  NA 137 132* 133*  K 3.5 4.3 5.2*  CL 109 110 111  CO2 16* 11* 14*  GLUCOSE 108* 177* 119*  BUN '6 18 20  ' CREATININE 1.10 1.79* 1.29*  CALCIUM 7.0* 4.1* 4.8*   LFT Recent Labs    12/30/18 0504  PROT 4.3*  ALBUMIN 1.9*  AST 86*  ALT 44  ALKPHOS 46  BILITOT 2.5*   PT/INR Recent Labs    12/28/18 0524  LABPROT 12.4  INR 0.9   Studies/Results: Dg Abd 1 View  Result Date: 12/29/2018 CLINICAL DATA:  Evaluate NG tube placement EXAM: ABDOMEN - 1 VIEW COMPARISON:  December 24, 2018 FINDINGS: The NG tube terminates in the gastric antrum. No free air, portal venous gas, or pneumatosis. Dilated loops of small bowel are identified with a paucity of colonic gas. IMPRESSION: 1. The NG tube is in good position. 2. Dilated small bowel loops with a paucity of colonic gas are most consistent with a bowel obstruction. Electronically Signed   By: Dorise Bullion III M.D   On: 12/29/2018 14:11   Dg Abd 1 View  Result Date: 12/29/2018 CLINICAL DATA:  Patient with generalized abdominal pain. EXAM: ABDOMEN - 1 VIEW COMPARISON:  CT abdomen pelvis 12/27/2018 FINDINGS: Multiple gaseous distended loops of small bowel are demonstrated within the abdomen measuring up to 5 cm. Small amount of colonic gas is present. Supine evaluation limited for the detection of  free intraperitoneal air. IMPRESSION:  Gaseous distended loops of small bowel throughout the abdomen with small amount of distal colonic gas. Findings may represent early/partial obstruction or ileus. Electronically Signed   By: Lovey Newcomer M.D.   On: 12/29/2018 10:35    IMPRESSION/PLAN  1. 28 y.o. male with EtOH pancreatitis. No evidence of infectious process. Afebrile. WBC 7.6. Lipase 840 12/28/2018 -NPO -IVF 150cc/hr -insulin gtt per hospitalist -follow Lipase, LFTs, glu, TGs levels   2. Small bowel Ileus versus functional obstruction secondary to pancreatitis: NGT to LIS draining green bilious drainage without significant relief, patient with persistent  abdominal distension and generalized abdominal pain. -appreciate surgery's recommendations -agree with NGT to LIS -may require unprepped colonoscopy for decompression vs rectal tube -repeat abd upright and flat xray -further recommendations per Dr. Lyndel Safe  3. Possible colitis at the hepatic flexure secondary to pancreatitis -eventual colonoscopy   4. Rectal bleeding. Hg 16.6. HCT 45.  -eventual colonoscopy  -FOBT pending   5. Elevated LFTs secondary to Etoh, fatty liver and pancreatitis  -follow hepatic panel -No alcohol    6. Thrombocytopenia secondary to underlying liver disease?  RUQ sono and CT  c/w hepatic steatosis, no evidence of cirrhosis. PLT  84 << 103 -monitor plt ct -further hepatology evaluation as outpatient    7. Hypocalcemia -followed by hospitalist   Further recommendations per Dr. Claudina Lick Dorathy Daft  12/30/2018, 11:08 AM

## 2018-12-30 NOTE — Progress Notes (Signed)
Hypoglycemic Event  CBG: 55 at 1404  Treatment: dextrose 50 % solution 25 mL at 1408  Symptoms: None  Follow-up CBG: Time: 1429 CBG Result:108  Possible Reasons for Event: Insulin drip  Comments/MD notified: Increase continuous rate; continue monitoring

## 2018-12-30 NOTE — Progress Notes (Signed)
CRITICAL VALUE ALERT  Critical Value: Calcium 4.8 mg/dl  Date & Time Notied:  33/82/50  Provider Notified: [place]  Hospitalist, C Wood  Orders Received/Actions taken:  Awaiting returned paged. Sharyl Nimrod and Selena Batten notified
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
## 2018-12-30 NOTE — Progress Notes (Signed)
Hypoglycemic Event  CBG: 51  Treatment: 82mL of Dextrose 50%  Symptoms: Asymptomatic  Follow-up CBG: Time: 2045 CBG Result: 151  Possible Reasons for Event: Medication insulin drip  Comments/MD notified: [name] made aware    Charles Dougherty

## 2018-12-30 NOTE — Progress Notes (Addendum)
PROGRESS NOTE    Charles Dougherty  WUJ:[id number] DOB: 11/22/1990 DOA: 12/27/2018 PCP: Patient, No Pcp Per   Brief Narrative: Patient is a 28 year old male with history of alcohol abuse, substance abuse, smoker who presents to the emergency department with complaints of abdominal pain.  Admitted for the management of acute pancreatitis related to alcohol.  Patient developed abdominal distention .  Abdominal x-ray suggested early bowel obstruction.  General surgery consulted and he was started on conservative management with NG tube decompression, n.p.o. status. Lipid panel showed showed severe hypertriglyceridemia.  Unclear if the pancreatitis was precipitated by alcohol or hypertriglyceridemia.  Started on insulin drip and moved to stepdown today.  GI also consulted.  Assessment & Plan:   Active Problems:   Acute alcoholic pancreatitis   Pancreatitis   ETOH abuse   Drug abuse (HCC)   Cystitis  Acute alcoholic  Pancreatitis versus pancreatitis precipitated by hypertriglyceridemia: Presented with abdominal pain, nausea and vomiting.  Elevated lipase with liver enzymes.  Everyday drinker.  Drinks beer usually.  Started on conservative management with IV fluids, n.p.o. and pain medications. CT imaging showed Infiltration and edema around the head and body of the pancreas consistent with acute edematous interstitial pancreatitis. No loculated fluid collections. No evidence of pancreatic necrosis.  Severe hypertriglyceridemia: Has family history of hypertriglyceridemia.  Repeat lipid panel still shows triglycerides more than 5000.  Started on insulin drip.  Check triglycerides every 12 hours.  Early bowel obstruction: Developed abdominal distention on 12/29/2018.   Abdominal x-ray showed early/partial obstruction versus ileus. Being followed by  general surgery. NG tube placed. Abdominal pain has somewhat improved but exacerbated by movement Passing little  Flatus.No bowel movement yet. CT  imaging done on presentation also showed some colonic  inflammation around the hepatic flexure.  GI following  Hypocalcemia/hypoalbuminemia: Most likely associated with pancreatitis.  We will continue supplement.  Elevated liver enzymes: Imagings done in the did not show any gallbladder stones or inflammation.  Also has Diffuse fatty infiltration of the liver.  Liver enzymes improving.  We will continue to monitor the levels.  Thrombocytopenia: Secondary to alcoholic liver disease.  We will continue to monitor.  Substance abuse: UDS positive for amphetamines, opiates, cannabis.  Discussed with mother  and she is interested on rehabilitation.  Social worker consulted.  Counseled for cessation of substance abuse.  Suspected Cystitis: As per CT imaging.  Denies any lower abdominal pain or increased frequency of urination.  Urinalysis not suggestive of urinary tract infection.No antibiotics indicated at this time.  Acute kidney injury: Improved with IV fluids today.  Hyponatremia/hyperkalemia: Mild.  Will check BMP tomorrow           DVT prophylaxis: Lovenox Code Status: Full Family Communication: Discussed with mother at the bedside Disposition Plan: Home after resolution of pancreatitis,return of  bowel obstruction   Consultants: Neurosurgery, GI Procedures: None  Antimicrobials:  Anti-infectives (From admission, onward)   None      Subjective: Patient seen and examined the bedside this morning.  Hemodynamically stable.  Mild tachycardic and hypertensive.  Abdominal pain might have improved today but is exacerbated with movement.  Has not had a bowel movement.  Better than yesterday  Objective: Vitals:   12/30/18 1200 12/30/18 1213 12/30/18 1300 12/30/18 1302  BP: (!) 155/103 (!) 145/97 (!) 159/109 (!) 161/95  Pulse: 98 96 (!) 105 (!) 110  Resp: (!) 23 (!) 24 (!) 23 (!) 26  Temp:      TempSrc:  SpO2: 95% 97% 95% 96%  Weight:      Height:        Intake/Output  Summary (Last 24 hours) at 12/30/2018 1413 Last data filed at 12/30/2018 0755 Gross per 24 hour  Intake 4664.3 ml  Output 500 ml  Net 4164.3 ml   Filed Weights   12/27/18 2143 12/28/18 0337 12/30/18 1100  Weight: 70.3 kg 69.7 kg 78 kg    Examination:  General exam: Appears calm and comfortable ,Not in distress,average built HEENT:PERRL,Oral mucosa moist, Ear/Nose normal on gross exam,NG tube Respiratory system: Bilateral equal air entry, normal vesicular breath sounds, no wheezes or crackles  Cardiovascular system: S1 & S2 heard, RRR. No JVD, murmurs, rubs, gallops or clicks. Gastrointestinal system: Abdomen is mildly distended, mild diffuse generalized tenderness. No organomegaly or masses felt. Normal bowel sounds heard. Central nervous system: Alert and oriented. No focal neurological deficits. Extremities: No edema, no clubbing ,no cyanosis, distal peripheral pulses palpable. Skin: No rashes, lesions or ulcers,no icterus ,no pallor    Data Reviewed: I have personally reviewed following labs and imaging studies  CBC: Recent Labs  Lab 12/27/18 2203 12/28/18 0524 12/29/18 1116  WBC 10.2 8.6 7.6  NEUTROABS  --   --  6.3  HGB 14.4 14.8 16.6  HCT 39.6 43.8 45.0  MCV 96.1 102.1* 102.0*  PLT 103* 92* 84*   Basic Metabolic Panel: Recent Labs  Lab 12/27/18 2203 12/28/18 0524 12/29/18 1116 12/30/18 0504  NA 133* 137 132* 133*  K 3.5 3.5 4.3 5.2*  CL 99 109 110 111  CO2 18* 16* 11* 14*  GLUCOSE 99 108* 177* 119*  BUN CREATININE 1.34* 1.10 1.79* 1.29*  CALCIUM 8.9 7.0* 4.1* 4.8*  MG  --  1.7  --   --   PHOS  --  2.9  --   --    GFR: Estimated Creatinine Clearance: 90.4 mL/min (A) (by C-G formula based on SCr of 1.29 mg/dL (H)). Liver Function Tests: Recent Labs  Lab 12/27/18 2203 12/28/18 0524 12/29/18 1116 12/30/18 0504  AST 158* 105* 87* 86*  ALT 150* 117* 61* 44  ALKPHOS 97 78 63 46  BILITOT 1.1 1.9* 1.3* 2.5*  PROT 6.1* 5.5* 4.9* 4.3*   ALBUMIN 3.8 3.2* 2.1* 1.9*   Recent Labs  Lab 12/27/18 2203 12/28/18 0524  LIPASE 209* 840*   No results for input(s): AMMONIA in the last 168 hours. Coagulation Profile: Recent Labs  Lab 12/28/18 0524  INR 0.9   Cardiac Enzymes: No results for input(s): CKTOTAL, CKMB, CKMBINDEX, TROPONINI in the last 168 hours. BNP (last 3 results) No results for input(s): PROBNP in the last 8760 hours. HbA1C: Recent Labs    12/28/18 0524 12/30/18 0843  HGBA1C 4.9 4.9   CBG: Recent Labs  Lab 12/30/18 1216 12/30/18 1319 12/30/18 1404  GLUCAP 104* 75 55*   Lipid Profile: Recent Labs    12/28/18 0524 12/30/18 0504  CHOL 1,108*  --   HDL 11*  --   LDLCALC UNABLE TO CALCULATE IF TRIGLYCERIDE OVER 400 mg/dL  --   TRIG >4,010* >2,725*  CHOLHDL NOT REPORTED DUE TO HIGH TRIGLYCERIDES  --    Thyroid Function Tests: Recent Labs    12/30/18 0843  TSH 2.923   Anemia Panel: No results for input(s): VITAMINB12, FOLATE, FERRITIN, TIBC, IRON, RETICCTPCT in the last 72 hours. Sepsis Labs: Recent Labs  Lab 12/30/18 0843  LATICACIDVEN 1.5    Recent Results (from the past 240  hour(s))  Urine culture     Status: None   Collection Time: 12/28/18  5:04 AM  Result Value Ref Range Status   Specimen Description   Final    URINE, RANDOM Performed at [facility], [address] [place], [place] [address]    Special Requests   Final    NONE Performed at [facility], [address], [place], [place] [address]    Culture   Final    NO GROWTH Performed at [facility], [address], [place], [place] [address]    Report Status 12/29/2018 FINAL  Final  MRSA PCR Screening     Status: None   Collection Time: 12/30/18 11:24 AM  Result Value Ref Range Status   MRSA by PCR NEGATIVE NEGATIVE Final    Comment:        The GeneXpert MRSA Assay (FDA approved for NASAL specimens only), is one component of a comprehensive MRSA colonization  surveillance program. It is not intended to diagnose MRSA infection nor to guide or monitor treatment for MRSA infections. Performed at [facility], [address] [place], [place] [address]          Radiology Studies: Dg Abd 1 View  Result Date: 12/29/2018 CLINICAL DATA:  Evaluate NG tube placement EXAM: ABDOMEN - 1 VIEW COMPARISON:  December 24, 2018 FINDINGS: The NG tube terminates in the gastric antrum. No free air, portal venous gas, or pneumatosis. Dilated loops of small bowel are identified with a paucity of colonic gas. IMPRESSION: 1. The NG tube is in good position. 2. Dilated small bowel loops with a paucity of colonic gas are most consistent with a bowel obstruction. Electronically Signed   By: Gerome Sam III M.D   On: 12/29/2018 14:11   Dg Abd 1 View  Result Date: 12/29/2018 CLINICAL DATA:  Patient with generalized abdominal pain. EXAM: ABDOMEN - 1 VIEW COMPARISON:  CT abdomen pelvis 12/27/2018 FINDINGS: Multiple gaseous distended loops of small bowel are demonstrated within the abdomen measuring up to 5 cm. Small amount of colonic gas is present. Supine evaluation limited for the detection of free intraperitoneal air. IMPRESSION: Gaseous distended loops of small bowel throughout the abdomen with small amount of distal colonic gas. Findings may represent early/partial obstruction or ileus. Electronically Signed   By: Annia Belt M.D.   On: 12/29/2018 10:35   Dg Abd Portable 2v  Result Date: 12/30/2018 CLINICAL DATA:  Abdominal distension in a patient with pancreatitis. EXAM: PORTABLE ABDOMEN - 2 VIEW COMPARISON:  Single-view of the abdomen 12/29/2018. FINDINGS: NG tube remains in place. Gaseous distention of small bowel persists without marked change. Only small volume of stool is seen in the ascending colon. IMPRESSION: Unchanged gaseous with little stool in the colon most worrisome distention of small bowel for obstruction. Electronically Signed   By:  Drusilla Kanner M.D.   On: 12/30/2018 13:30        Scheduled Meds: . Chlorhexidine Gluconate Cloth  6 each Topical Daily  . enoxaparin (LOVENOX) injection  40 mg Subcutaneous Q24H   Continuous Infusions: . sodium chloride Stopped (12/30/18 1224)  . dextrose 100 mL/hr at 12/30/18 1220  . insulin 0.2 Units/kg/hr (12/30/18 1223)     LOS: 2 days    Time spent: 25 mins.More than 50% of that time was spent in counseling and/or coordination of care.      Burnadette Pop, MD [facility] Pager [phone]  If 7PM-7AM, please contact night-coverage  [email or web address] Password [facility] 12/30/2018, 2:13 PM

## 2018-12-31 ENCOUNTER — Inpatient Hospital Stay (HOSPITAL_COMMUNITY): Payer: Federal, State, Local not specified - PPO

## 2018-12-31 DIAGNOSIS — R1010 Upper abdominal pain, unspecified: Secondary | ICD-10-CM

## 2018-12-31 DIAGNOSIS — R109 Unspecified abdominal pain: Secondary | ICD-10-CM

## 2018-12-31 LAB — PHOSPHORUS
PHOSPHORUS: 2 mg/dL — AB (ref 2.5–4.6)
Phosphorus: 1.1 mg/dL — ABNORMAL LOW (ref 2.5–4.6)

## 2018-12-31 LAB — CBC WITH DIFFERENTIAL/PLATELET
Abs Immature Granulocytes: 0.3 10*3/uL — ABNORMAL HIGH (ref 0.00–0.07)
Basophils Absolute: 0.1 10*3/uL (ref 0.0–0.1)
Basophils Relative: 2 %
EOS PCT: 2 %
Eosinophils Absolute: 0.1 10*3/uL (ref 0.0–0.5)
HCT: 40.7 % (ref 39.0–52.0)
HEMOGLOBIN: 13.2 g/dL (ref 13.0–17.0)
Immature Granulocytes: 7 %
Lymphocytes Relative: 13 %
Lymphs Abs: 0.5 10*3/uL — ABNORMAL LOW (ref 0.7–4.0)
MCH: 33.3 pg (ref 26.0–34.0)
MCHC: 32.4 g/dL (ref 30.0–36.0)
MCV: 102.8 fL — ABNORMAL HIGH (ref 80.0–100.0)
MONOS PCT: 5 %
Monocytes Absolute: 0.2 10*3/uL (ref 0.1–1.0)
Neutro Abs: 2.9 10*3/uL (ref 1.7–7.7)
Neutrophils Relative %: 71 %
Platelets: 53 10*3/uL — ABNORMAL LOW (ref 150–400)
RBC: 3.96 MIL/uL — ABNORMAL LOW (ref 4.22–5.81)
RDW: 14 % (ref 11.5–15.5)
WBC: 4.2 10*3/uL (ref 4.0–10.5)
nRBC: 0.7 % — ABNORMAL HIGH (ref 0.0–0.2)

## 2018-12-31 LAB — BASIC METABOLIC PANEL
Anion gap: 8 (ref 5–15)
Anion gap: 8 (ref 5–15)
BUN: 12 mg/dL (ref 6–20)
BUN: 8 mg/dL (ref 6–20)
CALCIUM: 6.6 mg/dL — AB (ref 8.9–10.3)
CO2: 17 mmol/L — ABNORMAL LOW (ref 22–32)
CO2: 21 mmol/L — AB (ref 22–32)
Calcium: 5.5 mg/dL — CL (ref 8.9–10.3)
Chloride: 106 mmol/L (ref 98–111)
Chloride: 101 mmol/L (ref 98–111)
Creatinine, Ser: 0.88 mg/dL (ref 0.61–1.24)
Creatinine, Ser: 0.63 mg/dL (ref 0.61–1.24)
GFR calc Af Amer: >60 mL/min (ref >60)
GFR calc Af Amer: >60 mL/min (ref >60)
GFR calc non Af Amer: >60 mL/min (ref >60)
GFR calc non Af Amer: >60 mL/min (ref >60)
Glucose, Bld: 148 mg/dL — ABNORMAL HIGH (ref 70–99)
Glucose, Bld: 61 mg/dL — ABNORMAL LOW (ref 70–99)
Potassium: 3 mmol/L — ABNORMAL LOW (ref 3.5–5.1)
Potassium: 3.6 mmol/L (ref 3.5–5.1)
Sodium: 131 mmol/L — ABNORMAL LOW (ref 135–145)
Sodium: 130 mmol/L — ABNORMAL LOW (ref 135–145)

## 2018-12-31 LAB — GLUCOSE, CAPILLARY
GLUCOSE-CAPILLARY: 134 mg/dL — AB (ref 70–99)
Glucose-Capillary: 103 mg/dL — ABNORMAL HIGH (ref 70–99)
Glucose-Capillary: 103 mg/dL — ABNORMAL HIGH (ref 70–99)
Glucose-Capillary: 104 mg/dL — ABNORMAL HIGH (ref 70–99)
Glucose-Capillary: 106 mg/dL — ABNORMAL HIGH (ref 70–99)
Glucose-Capillary: 111 mg/dL — ABNORMAL HIGH (ref 70–99)
Glucose-Capillary: 117 mg/dL — ABNORMAL HIGH (ref 70–99)
Glucose-Capillary: 123 mg/dL — ABNORMAL HIGH (ref 70–99)
Glucose-Capillary: 127 mg/dL — ABNORMAL HIGH (ref 70–99)
Glucose-Capillary: 145 mg/dL — ABNORMAL HIGH (ref 70–99)
Glucose-Capillary: 151 mg/dL — ABNORMAL HIGH (ref 70–99)
Glucose-Capillary: 158 mg/dL — ABNORMAL HIGH (ref 70–99)
Glucose-Capillary: 51 mg/dL — ABNORMAL LOW (ref 70–99)
Glucose-Capillary: 57 mg/dL — ABNORMAL LOW (ref 70–99)
Glucose-Capillary: 61 mg/dL — ABNORMAL LOW (ref 70–99)
Glucose-Capillary: 73 mg/dL (ref 70–99)
Glucose-Capillary: 73 mg/dL (ref 70–99)
Glucose-Capillary: 75 mg/dL (ref 70–99)
Glucose-Capillary: 87 mg/dL (ref 70–99)
Glucose-Capillary: 97 mg/dL (ref 70–99)
Glucose-Capillary: 98 mg/dL (ref 70–99)
Glucose-Capillary: 98 mg/dL (ref 70–99)
Glucose-Capillary: 99 mg/dL (ref 70–99)

## 2018-12-31 LAB — MAGNESIUM
MAGNESIUM: 2.1 mg/dL (ref 1.7–2.4)
Magnesium: 2 mg/dL (ref 1.7–2.4)

## 2018-12-31 LAB — IRON AND TIBC
IRON: 32 ug/dL — AB (ref 45–182)
Saturation Ratios: 28 % (ref 17.9–39.5)
TIBC: 116 ug/dL — ABNORMAL LOW (ref 250–450)
UIBC: 84 ug/dL

## 2018-12-31 LAB — TRIGLYCERIDES
Triglycerides: 1143 mg/dL — ABNORMAL HIGH (ref <150)
Triglycerides: 485 mg/dL — ABNORMAL HIGH (ref <150)

## 2018-12-31 LAB — VITAMIN B12: Vitamin B-12: 1606 pg/mL — ABNORMAL HIGH (ref 180–914)

## 2018-12-31 LAB — PREALBUMIN: Prealbumin: 13.1 mg/dL — ABNORMAL LOW (ref 18–38)

## 2018-12-31 LAB — FERRITIN: Ferritin: 1383 ng/mL — ABNORMAL HIGH (ref 24–336)

## 2018-12-31 MED ORDER — DEXTROSE 50 % IV SOLN
12.5000 g | INTRAVENOUS | Status: AC
  Administered 2018-12-31: 12.5 g via INTRAVENOUS

## 2018-12-31 MED ORDER — DEXTROSE-NACL 5-0.9 % IV SOLN
INTRAVENOUS | Status: DC
  Administered 2018-12-31 – 2019-01-06 (×9): via INTRAVENOUS

## 2018-12-31 MED ORDER — POTASSIUM PHOSPHATES 15 MMOLE/5ML IV SOLN
20.0000 mmol | Freq: Once | INTRAVENOUS | Status: AC
  Administered 2018-12-31: 20 mmol via INTRAVENOUS
  Filled 2018-12-31: qty 6.67

## 2018-12-31 MED ORDER — POTASSIUM CHLORIDE 10 MEQ/100ML IV SOLN: 10.0000 meq | INTRAVENOUS | Status: DC

## 2018-12-31 MED ORDER — POTASSIUM CHLORIDE 10 MEQ/100ML IV SOLN
10.0000 meq | INTRAVENOUS | Status: AC
  Administered 2018-12-31 (×3): 10 meq via INTRAVENOUS
  Filled 2018-12-31 (×3): qty 100

## 2018-12-31 MED ORDER — CALCIUM GLUCONATE-NACL 2-0.675 GM/100ML-% IV SOLN
2.0000 g | Freq: Once | INTRAVENOUS | Status: DC
  Filled 2018-12-31: qty 100

## 2018-12-31 MED ORDER — INSULIN (MYXREDLIN) INFUSION FOR HYPERTRIGLYCERIDEMIA
0.1000 [IU]/kg/h | INTRAVENOUS | Status: DC
  Administered 2018-12-31 (×2): 0.1 [IU]/kg/h via INTRAVENOUS
  Filled 2018-12-31 (×3): qty 100

## 2018-12-31 MED ORDER — CALCIUM GLUCONATE-NACL 1-0.675 GM/50ML-% IV SOLN
1.0000 g | Freq: Once | INTRAVENOUS | Status: AC
  Administered 2018-12-31: 1000 mg via INTRAVENOUS
  Filled 2018-12-31 (×2): qty 50

## 2018-12-31 MED ORDER — CALCIUM GLUCONATE-NACL 2-0.675 GM/100ML-% IV SOLN
2.0000 g | Freq: Once | INTRAVENOUS | Status: AC
  Administered 2018-12-31: 2000 mg via INTRAVENOUS
  Filled 2018-12-31: qty 100

## 2018-12-31 NOTE — Progress Notes (Signed)
CRITICAL VALUE ALERT  Critical Value:  Calcium 5.5  Date & Time Notied:  12/31/2018 0615  Provider Notified: [name]  Orders Received/Actions taken: no new orders at this time

## 2018-12-31 NOTE — Progress Notes (Signed)
Patient ID: Charles Dougherty, male   DOB: 01-20-1991, 28 y.o.   MRN: [id number]       Subjective: No flatus or BM, still very quite bloated.  Ambulated some yesterday.  Objective: Vital signs in last 24 hours: Temp:  [97.7 F (36.5 C)-99 F (37.2 C)] 99 F (37.2 C) (03/17 0800) Pulse Rate:  [96-123] 114 (03/17 0800) Resp:  [23-38] 30 (03/17 0800) BP: (130-166)/(85-114) 157/91 (03/17 0800) SpO2:  [94 %-99 %] 95 % (03/17 0800) Weight:  [78 kg] 78 kg (03/16 1100) Last BM Date: 12/27/18  Intake/Output from previous day: 03/16 0701 - 03/17 0700 In: 2186.6 [I.V.:2186.6] Out: 1625 [Urine:925; Emesis/NG output:700] Intake/Output this shift: Total I/O In: 1041.7 [I.V.:852.3; IV Piggyback:189.5] Out: -   PE: Abd: distended, hypoactive BS, NGT with bilious output, about 700cc documented.  Some tenderness in upper abdomen, but improved.  Lab Results:  Recent Labs    12/30/18 1534 12/31/18 0525  WBC 8.6 4.2  HGB 11.9* 13.2  HCT 34.9* 40.7  PLT 95* 53*   BMET Recent Labs    12/30/18 1532 12/31/18 0525  NA 132* 131*  K 5.4* 3.0*  CL 109 106  CO2 15* 17*  GLUCOSE 52* 148*  BUN 20 12  CREATININE 1.12 0.88  CALCIUM 5.4* 5.5*   PT/INR No results for input(s): LABPROT, INR in the last 72 hours. CMP     Component Value Date/Time   NA 131 (L) 12/31/2018 0525   K 3.0 (L) 12/31/2018 0525   CL 106 12/31/2018 0525   CO2 17 (L) 12/31/2018 0525   GLUCOSE 148 (H) 12/31/2018 0525   BUN 12 12/31/2018 0525   CREATININE 0.88 12/31/2018 0525   CALCIUM 5.5 (LL) 12/31/2018 0525   PROT 4.3 (L) 12/30/2018 0504   ALBUMIN 1.9 (L) 12/30/2018 0504   AST 86 (H) 12/30/2018 0504   ALT 44 12/30/2018 0504   ALKPHOS 46 12/30/2018 0504   BILITOT 2.5 (H) 12/30/2018 0504   GFRNONAA >60 12/31/2018 0525   GFRAA >60 12/31/2018 0525   Lipase     Component Value Date/Time   LIPASE 142 (H) 12/30/2018 1532       Studies/Results: Dg Abd 1 View  Result Date: 12/29/2018 CLINICAL DATA:   Evaluate NG tube placement EXAM: ABDOMEN - 1 VIEW COMPARISON:  December 24, 2018 FINDINGS: The NG tube terminates in the gastric antrum. No free air, portal venous gas, or pneumatosis. Dilated loops of small bowel are identified with a paucity of colonic gas. IMPRESSION: 1. The NG tube is in good position. 2. Dilated small bowel loops with a paucity of colonic gas are most consistent with a bowel obstruction. Electronically Signed   By: Gerome Sam III M.D   On: 12/29/2018 14:11   Dg Abd 1 View  Result Date: 12/29/2018 CLINICAL DATA:  Patient with generalized abdominal pain. EXAM: ABDOMEN - 1 VIEW COMPARISON:  CT abdomen pelvis 12/27/2018 FINDINGS: Multiple gaseous distended loops of small bowel are demonstrated within the abdomen measuring up to 5 cm. Small amount of colonic gas is present. Supine evaluation limited for the detection of free intraperitoneal air. IMPRESSION: Gaseous distended loops of small bowel throughout the abdomen with small amount of distal colonic gas. Findings may represent early/partial obstruction or ileus. Electronically Signed   By: Annia Belt M.D.   On: 12/29/2018 10:35   Dg Abd Portable 2v  Result Date: 12/30/2018 CLINICAL DATA:  Abdominal distension in a patient with pancreatitis. EXAM: PORTABLE ABDOMEN - 2 VIEW COMPARISON:  Single-view of the abdomen 12/29/2018. FINDINGS: NG tube remains in place. Gaseous distention of small bowel persists without marked change. Only small volume of stool is seen in the ascending colon. IMPRESSION: Unchanged gaseous with little stool in the colon most worrisome distention of small bowel for obstruction. Electronically Signed   By: Drusilla Kanner M.D.   On: 12/30/2018 13:30    Anti-infectives: Anti-infectives (From admission, onward)   None       Assessment/Plan Ileus secondary to pancreatitis Continue NGT for today and await bowel function. -mobilize and pulm toilet  -K 3.0, replace per medicine today to keep K above 4 to  help with bowel motility  FEN - NPO/NGT/IVFs VTE - Chemical prophylaxis per primary ID - none currently   LOS: 3 days    Letha Cape , [facility] 12/31/2018, 9:44 AM Pager: [phone]

## 2018-12-31 NOTE — Progress Notes (Addendum)
Hypoglycemic Event  CBG: 61  Treatment: 12.38mL of Dextrose 5%  Symptoms: asymtomatic  Follow-up CBG: Time: 0320 CBG Result: 91  Possible Reasons for Event: medication - on insulin drip   Comments/MD notified: [name] made aware    Charles Dougherty

## 2018-12-31 NOTE — Progress Notes (Signed)
Hypoglycemic Event  CBG: 51  Treatment: Given 25 ml of Dextrose 50% per order.  Symptoms: asymptomatic   Follow-up CBG: Time:1615 CBG Result:106  Possible Reasons for Event: Insulin drip for hypertriglyceridemia   Comments/MD notified: Adhikari notified new orders to stop insulin drip RN will stop drip and continue to monitor pt. Closely.    [name]

## 2018-12-31 NOTE — Progress Notes (Signed)
Hypoglycemic Event  CBG: 57  Treatment: 12.47mL of Dextrose 50%  Symptoms: asymptomatic  Follow-up CBG: Time: 0520 CBG Result: 145  Possible Reasons for Event: medication - on insulin drip  Comments/MD notified: Charles Dougherty  Charles Dougherty

## 2018-12-31 NOTE — Progress Notes (Addendum)
PROGRESS NOTE    Charles Dougherty  WUJ:[id number] DOB: [birth year] DOA: 12/27/2018 PCP: Patient, No Pcp Per   Brief Narrative: Patient is a 28 year old male with history of alcohol abuse, substance abuse, smoker who presents to the emergency department with complaints of abdominal pain.  Admitted for the management of acute pancreatitis related to alcohol. Incidentally found to have severe hypertriglyceridemia.He has positive family history.  Hospital course remarkable for abdominal discomfort, distention .Abdominal x-ray suggested early bowel obstruction.  General surgery consulted and he was started on conservative management with NG tube decompression, n.p.o. status. Unclear if the pancreatitis was precipitated by alcohol or hypertriglyceridemia.  Due to severe hypertriglyceridemia ,we started on insulin drip and moved to stepdown.  GI also consulted.  Assessment & Plan:   Active Problems:   Acute alcoholic pancreatitis   Pancreatitis   ETOH abuse   Drug abuse (HCC)   Cystitis   Ileus (HCC)   Hypertriglyceridemia  Acute alcoholic  Pancreatitis versus pancreatitis precipitated by hypertriglyceridemia: Presented with abdominal pain, nausea and vomiting.  Elevated lipase with liver enzymes.  Everyday drinker.  Drinks beer usually.  Started on conservative management with IV fluids, n.p.o. and pain medications. CT imaging showed Infiltration and edema around the head and body of the pancreas consistent with acute edematous interstitial pancreatitis. No loculated fluid collections. No evidence of pancreatic necrosis. Continue current management. Patient still tachycardic and hypertensive most likely secondary to pain.  Continue pain management.  Continue PRN meds for hypertension.  Severe hypertriglyceridemia: Has family history of hypertriglyceridemia.His uncle has it.  Found to have triglycerides more than 5000, repeat lab confirmed it.  Started on insulin drip with significant improvement.  Check triglycerides every 12 hours.  We will stop insulin drip after triglyceride level fall below 500.  He should be on fenofibrate on discharge.  Hypoglycemic events: Because of insulin drip and he is n.p.o.  Continue D10 for now .  We will stop D10 after we stop insulin drip.  Small bowel obstruction: Most likely ssociated with pancreatitis.  Developed abdominal distention on 12/29/2018.   Abdominal x-ray today  showed small bowel obstruction. Being followed by  general surgery. NG tube to be continued. Abdominal pain has somewhat improved but exacerbated by movement No bowel movement yet, has not passed flatus today. CT imaging done on presentation also showed some colonic  inflammation around the hepatic flexure.  GI also following.  Hypocalcemia/hypoalbuminemia: Hypocalcemia associated with pancreatitis.  Also has severe hypoalbuminemia of 1.9.  Corrected calcium level 7.2.  Continue supplementation.  Hypokalemia/hypophosphatemia: Continue supplementation.  Continue to monitor the levels.  Elevated liver enzymes: Imagings done on presentation did not show any gallbladder stones or inflammation.  Also has Diffuse fatty infiltration of the liver.  Liver enzymes improved.  We will continue to monitor the levels.  Thrombocytopenia: Secondary to alcoholic liver disease.  We will continue to monitor.D/Ced lovenox.  Substance abuse: UDS positive for amphetamines, opiates, cannabis.  Discussed with mother  and she was very  interested on rehabilitation.  Social worker consulted.  Counseled for cessation of substance abuse. Will check hepatitis panel.  Suspected Cystitis: As per CT imaging.  Denies any lower abdominal pain or increased frequency of urination.  Urinalysis not suggestive of urinary tract infection.No antibiotics indicated at this time.  Acute kidney injury: Improved with IV fluids today.         DVT prophylaxis: SCD Code Status: Full Family Communication: Discussed with  mother at the bedside yesterday Disposition Plan: Home after resolution  of pancreatitis,return of  bowel obstruction   Consultants: Neurosurgery, GI Procedures: None  Antimicrobials:  Anti-infectives (From admission, onward)   None      Subjective: Patient seen and examined the bedside this morning.  Remains mildly tachycardic and hypertensive.  He says that abdominal pain has improved.  Has not passed any flatus.  No bowel movement yet.  Denies any nausea.  Objective: Vitals:   12/31/18 0600 12/31/18 0700 12/31/18 0800 12/31/18 1200  BP: 130/85 (!) 141/88 (!) 157/91   Pulse: (!) 108 (!) 108 (!) 114   Resp: (!) 26 (!) 25 (!) 30   Temp:   99 F (37.2 C) 98 F (36.7 C)  TempSrc:   Oral Oral  SpO2: 95% 94% 95%   Weight:      Height:        Intake/Output Summary (Last 24 hours) at 12/31/2018 1247 Last data filed at 12/31/2018 1201 Gross per 24 hour  Intake 3951.46 ml  Output 1425 ml  Net 2526.46 ml   Filed Weights   12/27/18 2143 12/28/18 0337 12/30/18 1100  Weight: 70.3 kg 69.7 kg 78 kg    Examination:  General exam: Not in distress,average built HEENT:PERRL,Oral mucosa moist, Ear/Nose normal on gross exam,NG tube  Respiratory system: Bilateral equal air entry, normal vesicular breath sounds, no wheezes or crackles  Cardiovascular system: S1 & S2 heard, RRR. No JVD, murmurs, rubs, gallops or clicks. Gastrointestinal system: Abdomen is distended, mildly tense, mild diffuse tenderness. No organomegaly or masses felt. No bowel sounds heard. Central nervous system: Alert and oriented. No focal neurological deficits. Extremities: No edema, no clubbing ,no cyanosis, distal peripheral pulses palpable. Skin: No rashes, lesions or ulcers,no icterus ,no pallor   Data Reviewed: I have personally reviewed following labs and imaging studies  CBC: Recent Labs  Lab 12/27/18 2203 12/28/18 0524 12/29/18 1116 12/30/18 1534 12/31/18 0525  WBC 10.2 8.6 7.6 8.6 4.2  NEUTROABS   --   --  6.3 6.5 2.9  HGB 14.4 14.8 16.6 11.9* 13.2  HCT 39.6 43.8 45.0 34.9* 40.7  MCV 96.1 102.1* 102.0* 102.0* 102.8*  PLT 103* 92* 84* 95* 53*   Basic Metabolic Panel: Recent Labs  Lab 12/28/18 0524 12/29/18 1116 12/30/18 0504 12/30/18 1532 12/31/18 0525  NA 137 132* 133* 132* 131*  K 3.5 4.3 5.2* 5.4* 3.0*  CL 109 110 111 109 106  CO2 16* 11* 14* 15* 17*  GLUCOSE 108* 177* 119* 52* 148*  BUN 6 18 20 20 12   CREATININE 1.10 1.79* 1.29* 1.12 0.88  CALCIUM 7.0* 4.1* 4.8* 5.4* 5.5*  MG 1.7  --   --  2.5* 2.0  PHOS 2.9  --   --  1.5* 1.1*   GFR: Estimated Creatinine Clearance: 132.5 mL/min (by C-G formula based on SCr of 0.88 mg/dL). Liver Function Tests: Recent Labs  Lab 12/27/18 2203 12/28/18 0524 12/29/18 1116 12/30/18 0504  AST 158* 105* 87* 86*  ALT 150* 117* 61* 44  ALKPHOS 97 78 63 46  BILITOT 1.1 1.9* 1.3* 2.5*  PROT 6.1* 5.5* 4.9* 4.3*  ALBUMIN 3.8 3.2* 2.1* 1.9*   Recent Labs  Lab 12/27/18 2203 12/28/18 0524 12/30/18 1532  LIPASE 209* 840* 142*   No results for input(s): AMMONIA in the last 168 hours. Coagulation Profile: Recent Labs  Lab 12/28/18 0524  INR 0.9   Cardiac Enzymes: No results for input(s): CKTOTAL, CKMB, CKMBINDEX, TROPONINI in the last 168 hours. BNP (last 3 results) No results for input(s): PROBNP  in the last 8760 hours. HbA1C: Recent Labs    12/30/18 0843  HGBA1C 4.9   CBG: Recent Labs  Lab 12/31/18 0800 12/31/18 0902 12/31/18 1010 12/31/18 1117 12/31/18 1154  GLUCAP 151* 158* 123* 104* 103*   Lipid Profile: Recent Labs    12/30/18 1535 12/31/18 0525  TRIG 4,430* 1,143*   Thyroid Function Tests: Recent Labs    12/30/18 0843  TSH 2.923   Anemia Panel: Recent Labs    12/31/18 0525  VITAMINB12 1,606*  FERRITIN 1,383*  TIBC 116*  IRON 32*   Sepsis Labs: Recent Labs  Lab 12/30/18 0843  LATICACIDVEN 1.5    Recent Results (from the past 240 hour(s))  Urine culture     Status: None    Collection Time: 12/28/18  5:04 AM  Result Value Ref Range Status   Specimen Description   Final    URINE, RANDOM Performed at [facility], [address] [place], [place] [address]    Special Requests   Final    NONE Performed at [facility], [address] [place], [place] [address]    Culture   Final    NO GROWTH Performed at [facility], [address] [place], [place] [address]    Report Status 12/29/2018 FINAL  Final  MRSA PCR Screening     Status: None   Collection Time: 12/30/18 11:24 AM  Result Value Ref Range Status   MRSA by PCR NEGATIVE NEGATIVE Final    Comment:        The GeneXpert MRSA Assay (FDA approved for NASAL specimens only), is one component of a comprehensive MRSA colonization surveillance program. It is not intended to diagnose MRSA infection nor to guide or monitor treatment for MRSA infections. Performed at [facility], [address] [place], [place] [address]          Radiology Studies: Dg Abd 1 View  Result Date: 12/29/2018 CLINICAL DATA:  Evaluate NG tube placement EXAM: ABDOMEN - 1 VIEW COMPARISON:  December 24, 2018 FINDINGS: The NG tube terminates in the gastric antrum. No free air, portal venous gas, or pneumatosis. Dilated loops of small bowel are identified with a paucity of colonic gas. IMPRESSION: 1. The NG tube is in good position. 2. Dilated small bowel loops with a paucity of colonic gas are most consistent with a bowel obstruction. Electronically Signed   By: Gerome Samavid  Williams III M.D   On: 12/29/2018 14:11   Dg Abd Portable 2v  Result Date: 12/31/2018 CLINICAL DATA:  Distended abdomen. Ileus versus small bowel obstruction. EXAM: PORTABLE ABDOMEN - 2 VIEW COMPARISON:  12/30/2018. FINDINGS: NG tube noted coiled stomach in stable position. Persistent severe distention of small bowel loops. Small bowel loops measure up to 6 cm in diameter. There is relative paucity of  intra colonic gas. Small amount of stool in the right colon. Findings again suggest small bowel obstruction. No free air. IMPRESSION: 1.  NG tube in stable position. 2. Persistent severe distention of small bowel loops with relative paucity of intra colonic gas. Findings worrisome for small bowel obstruction. Electronically Signed   By: Maisie Fushomas  Register   On: 12/31/2018 10:29   Dg Abd Portable 2v  Result Date: 12/30/2018 CLINICAL DATA:  Abdominal distension in a patient with pancreatitis. EXAM: PORTABLE ABDOMEN - 2 VIEW COMPARISON:  Single-view of the abdomen 12/29/2018. FINDINGS: NG tube remains in place. Gaseous distention of small bowel persists without marked change. Only small volume  of stool is seen in the ascending colon. IMPRESSION: Unchanged gaseous with little stool in the colon most worrisome distention of small bowel for obstruction. Electronically Signed   By: Drusilla Kanner M.D.   On: 12/30/2018 13:30        Scheduled Meds:  Chlorhexidine Gluconate Cloth  6 each Topical Daily   dextrose  12.5 g Intravenous STAT   Continuous Infusions:  sodium chloride Stopped (12/31/18 0833)   calcium gluconate     dextrose 150 mL/hr at 12/31/18 1201   insulin 0.1 Units/kg/hr (12/31/18 1201)   potassium PHOSPHATE IVPB (in mmol) 85 mL/hr at 12/31/18 1201     LOS: 3 days    Time spent: 25 mins.More than 50% of that time was spent in counseling and/or coordination of care.      Burnadette Pop, MD [facility] Pager [phone]  If 7PM-7AM, please contact night-coverage [email or web address] Password [email or web address] 12/31/2018, 12:47 PM

## 2018-12-31 NOTE — Progress Notes (Signed)
[facility] Progress Note  CC:   Etoh + Hypertriglyceridemia Pancreatitis   Subjective: He reports feeling less abdominal bloat. Feels like he might pass gas from the rectum but has not done so. Feels he is getting a little strength back. Mouth is dry. Mother at bed side.  Objective:  Vital signs in last 24 hours: Temp:  [97.7 F (36.5 C)-99 F (37.2 C)] 99 F (37.2 C) (03/17 0800) Pulse Rate:  [96-123] 114 (03/17 0800) Resp:  [23-38] 30 (03/17 0800) BP: (130-166)/(85-114) 157/91 (03/17 0800) SpO2:  [94 %-99 %] 95 % (03/17 0800) Weight:  [78 kg] 78 kg (03/16 1100) Last BM Date: 12/27/18 General:   Alert, fatigued appearing in NAD Heart:  Tachycardic, no murmurs Pulm: Clear, diminished in the bases bilaterally Abdomen: Distended, firm, mild generalized tenderness without rebound or guarding, few brief bowel sounds to lower abdomen auscultated with NGT briefly clamped, NGT to LIS draining bilious drainage. Extremities:  Without edema. Neurologic:  Alert and  oriented x4;  grossly normal neurologically. Psych:  Alert and cooperative. Normal mood and affect.  Intake/Output from previous day: 03/16 0701 - 03/17 0700 In: 2186.6 [I.V.:2186.6] Out: 1625 [Urine:925; Emesis/NG output:700] Intake/Output this shift: No intake/output data recorded.  Lab Results: Recent Labs    12/29/18 1116 12/30/18 1534 12/31/18 0525  WBC 7.6 8.6 4.2  HGB 16.6 11.9* 13.2  HCT 45.0 34.9* 40.7  PLT 84* 95* 53*   BMET Recent Labs    12/30/18 0504 12/30/18 1532 12/31/18 0525  NA 133* 132* 131*  K 5.2* 5.4* 3.0*  CL 111 109 106  CO2 14* 15* 17*  GLUCOSE 119* 52* 148*  BUN 20 20 12   CREATININE 1.29* 1.12 0.88  CALCIUM 4.8* 5.4* 5.5*   LFT Recent Labs    12/30/18 0504  PROT 4.3*  ALBUMIN 1.9*  AST 86*  ALT 44  ALKPHOS 46  BILITOT 2.5*   Dg Abd 1 View  Result Date: 12/29/2018 CLINICAL DATA:  Evaluate NG tube placement EXAM: ABDOMEN - 1 VIEW COMPARISON:  December 24, 2018 FINDINGS: The NG tube terminates in the gastric antrum. No free air, portal venous gas, or pneumatosis. Dilated loops of small bowel are identified with a paucity of colonic gas. IMPRESSION: 1. The NG tube is in good position. 2. Dilated small bowel loops with a paucity of colonic gas are most consistent with a bowel obstruction. Electronically Signed   By: Gerome Sam III M.D   On: 12/29/2018 14:11   Dg Abd 1 View  Result Date: 12/29/2018 CLINICAL DATA:  Patient with generalized abdominal pain. EXAM: ABDOMEN - 1 VIEW COMPARISON:  CT abdomen pelvis 12/27/2018 FINDINGS: Multiple gaseous distended loops of small bowel are demonstrated within the abdomen measuring up to 5 cm. Small amount of colonic gas is present. Supine evaluation limited for the detection of free intraperitoneal air. IMPRESSION: Gaseous distended loops of small bowel throughout the abdomen with small amount of distal colonic gas. Findings may represent early/partial obstruction or ileus. Electronically Signed   By: Annia Belt M.D.   On: 12/29/2018 10:35   Dg Abd Portable 2v  Result Date: 12/30/2018 CLINICAL DATA:  Abdominal distension in a patient with pancreatitis. EXAM: PORTABLE ABDOMEN - 2 VIEW COMPARISON:  Single-view of the abdomen 12/29/2018. FINDINGS: NG tube remains in place. Gaseous distention of small bowel persists without marked change. Only small volume of stool is seen in the ascending colon. IMPRESSION: Unchanged gaseous with little stool in the colon most  worrisome distention of small bowel for obstruction. Electronically Signed   By: Drusilla Kanner M.D.   On: 12/30/2018 13:30    Assessment / Plan:  1. 28 y.o. male with EtOH pancreatitis + hypertriglyceridema. Lipase 142 on 3/16 Triglycerides > 5000 down to 1143 after insulin ggt initiated 3/16.  WBC 4.2. -NPO -IVF 150cc/hr -insulin gtt/glucose monitoring per hospitalist -follow Lipase, LFTs, glu, TGs levels  -may need plasma exchange, further  recommendations per Dr. Barron Alvine -endocrinology consult as outpatient   2. Small bowel Ileus versus functional obstruction secondary to pancreatitis: NGT to LIS draining green bilious drainage without significant relief, patient with persistent  abdominal distension and generalized abdominal pain. -appreciate surgery's recommendations -agree with NGT to LIS -remain NPO -Abd flat/upright xray   3. Possible colitis at the hepatic flexure secondary to pancreatitis -eventual colonoscopy   4. Rectal bleeding. Hg 16.6. HCT 45.  -eventual colonoscopy    5. Elevated LFTs and T. bili secondary to Etoh, fatty liver and pancreatitis. Abd CT 3/14 showed fatty liver, normal gallbladder. 3/16 AST 86. ALT 44. T. Bili 2.5. Albumin 1.9. -follow hepatic panel -Hep A, B, C screen  -No alcohol   6. Hypocalcemia Ca 5.5.Hyponatremia Na 131. Hypokalemia K 3.0.  Hypophosphotemia Phos 1.1. Hypoglycemia Glu 57-148 -consider  Nephrology consult for plasma exchange with persist hypocalcemia, note plasma exchange is done at [facility]  -Calcium Gluconate, KCL, Phos, Mg replacement per hospitalist   7. Alchol abuse -CIWA protocol   Further recommendations per Dr. Barron Alvine       LOS: 3 days   Charles Dougherty  12/31/2018, 9:00 AM

## 2019-01-01 ENCOUNTER — Inpatient Hospital Stay (HOSPITAL_COMMUNITY): Payer: Federal, State, Local not specified - PPO

## 2019-01-01 DIAGNOSIS — D649 Anemia, unspecified: Secondary | ICD-10-CM

## 2019-01-01 DIAGNOSIS — N179 Acute kidney failure, unspecified: Secondary | ICD-10-CM

## 2019-01-01 DIAGNOSIS — R1084 Generalized abdominal pain: Secondary | ICD-10-CM

## 2019-01-01 DIAGNOSIS — K852 Alcohol induced acute pancreatitis without necrosis or infection: Secondary | ICD-10-CM

## 2019-01-01 DIAGNOSIS — K56609 Unspecified intestinal obstruction, unspecified as to partial versus complete obstruction: Secondary | ICD-10-CM

## 2019-01-01 LAB — HEPATITIS PANEL, ACUTE
HCV Ab: <0.1 s/co ratio (ref 0.0–0.9)
Hep A IgM: NEGATIVE
Hep B C IgM: NEGATIVE
Hepatitis B Surface Ag: NEGATIVE

## 2019-01-01 LAB — BASIC METABOLIC PANEL
Anion gap: 7 (ref 5–15)
BUN: 8 mg/dL (ref 6–20)
CHLORIDE: 103 mmol/L (ref 98–111)
CO2: 24 mmol/L (ref 22–32)
Calcium: 7.1 mg/dL — ABNORMAL LOW (ref 8.9–10.3)
Creatinine, Ser: 0.59 mg/dL — ABNORMAL LOW (ref 0.61–1.24)
GFR calc Af Amer: >60 mL/min (ref >60)
GFR calc non Af Amer: >60 mL/min (ref >60)
Glucose, Bld: 126 mg/dL — ABNORMAL HIGH (ref 70–99)
Potassium: 3.8 mmol/L (ref 3.5–5.1)
SODIUM: 134 mmol/L — AB (ref 135–145)

## 2019-01-01 LAB — CBC WITH DIFFERENTIAL/PLATELET
Abs Immature Granulocytes: 0.5 10*3/uL — ABNORMAL HIGH (ref 0.00–0.07)
Band Neutrophils: 24 %
Basophils Absolute: 0 10*3/uL (ref 0.0–0.1)
Basophils Relative: 0 %
Eosinophils Absolute: 0.2 10*3/uL (ref 0.0–0.5)
Eosinophils Relative: 2 %
HCT: 27.1 % — ABNORMAL LOW (ref 39.0–52.0)
Hemoglobin: 8.9 g/dL — ABNORMAL LOW (ref 13.0–17.0)
Lymphocytes Relative: 12 %
Lymphs Abs: 0.9 10*3/uL (ref 0.7–4.0)
MCH: 33.2 pg (ref 26.0–34.0)
MCHC: 32.8 g/dL (ref 30.0–36.0)
MCV: 101.1 fL — ABNORMAL HIGH (ref 80.0–100.0)
Metamyelocytes Relative: 1 %
Monocytes Absolute: 0.3 10*3/uL (ref 0.1–1.0)
Monocytes Relative: 4 %
Myelocytes: 5 %
NRBC: 0.8 % — AB (ref 0.0–0.2)
Neutro Abs: 6 10*3/uL (ref 1.7–7.7)
Neutrophils Relative %: 52 %
Platelets: 83 10*3/uL — ABNORMAL LOW (ref 150–400)
RBC: 2.68 MIL/uL — ABNORMAL LOW (ref 4.22–5.81)
RDW: 14.1 % (ref 11.5–15.5)
WBC: 7.9 10*3/uL (ref 4.0–10.5)

## 2019-01-01 LAB — COMPREHENSIVE METABOLIC PANEL
ALK PHOS: 53 U/L (ref 38–126)
ALT: 47 U/L — ABNORMAL HIGH (ref 0–44)
AST: 72 U/L — ABNORMAL HIGH (ref 15–41)
Albumin: 2.1 g/dL — ABNORMAL LOW (ref 3.5–5.0)
Anion gap: 6 (ref 5–15)
BUN: 7 mg/dL (ref 6–20)
CALCIUM: 6.9 mg/dL — AB (ref 8.9–10.3)
CO2: 23 mmol/L (ref 22–32)
Chloride: 102 mmol/L (ref 98–111)
Creatinine, Ser: 0.69 mg/dL (ref 0.61–1.24)
GFR calc Af Amer: >60 mL/min (ref >60)
Glucose, Bld: 125 mg/dL — ABNORMAL HIGH (ref 70–99)
Potassium: 4.3 mmol/L (ref 3.5–5.1)
Sodium: 131 mmol/L — ABNORMAL LOW (ref 135–145)
Total Bilirubin: 2.8 mg/dL — ABNORMAL HIGH (ref 0.3–1.2)
Total Protein: 4.8 g/dL — ABNORMAL LOW (ref 6.5–8.1)

## 2019-01-01 LAB — GLUCOSE, CAPILLARY
GLUCOSE-CAPILLARY: 110 mg/dL — AB (ref 70–99)
GLUCOSE-CAPILLARY: 120 mg/dL — AB (ref 70–99)
Glucose-Capillary: 109 mg/dL — ABNORMAL HIGH (ref 70–99)
Glucose-Capillary: 113 mg/dL — ABNORMAL HIGH (ref 70–99)
Glucose-Capillary: 116 mg/dL — ABNORMAL HIGH (ref 70–99)
Glucose-Capillary: 123 mg/dL — ABNORMAL HIGH (ref 70–99)

## 2019-01-01 LAB — PHOSPHORUS: Phosphorus: 2.4 mg/dL — ABNORMAL LOW (ref 2.5–4.6)

## 2019-01-01 LAB — TRIGLYCERIDES: Triglycerides: 399 mg/dL — ABNORMAL HIGH (ref <150)

## 2019-01-01 LAB — MAGNESIUM: Magnesium: 2 mg/dL (ref 1.7–2.4)

## 2019-01-01 MED ORDER — SODIUM PHOSPHATES 45 MMOLE/15ML IV SOLN
30.0000 mmol | Freq: Once | INTRAVENOUS | Status: AC
  Administered 2019-01-01: 30 mmol via INTRAVENOUS
  Filled 2019-01-01: qty 10

## 2019-01-01 MED ORDER — CALCIUM GLUCONATE-NACL 2-0.675 GM/100ML-% IV SOLN
2.0000 g | Freq: Once | INTRAVENOUS | Status: AC
  Administered 2019-01-01: 2000 mg via INTRAVENOUS
  Filled 2019-01-01: qty 100

## 2019-01-01 MED ORDER — SODIUM PHOSPHATES 45 MMOLE/15ML IV SOLN: 10.0000 mmol | Freq: Once | INTRAVENOUS | Status: DC

## 2019-01-01 NOTE — Progress Notes (Signed)
Patient ID: Charles Dougherty, male   DOB: 1991/04/20, 28 y.o.   MRN: [id number]       Subjective: No new complaints.  Abdominal pain is improving.  Starting passing quite a bit of gas yesterday and some today.  No BM yet.  Objective: Vital signs in last 24 hours: Temp:  [98 F (36.7 C)-99.7 F (37.6 C)] 98.8 F (37.1 C) (03/18 0400) Pulse Rate:  [105-122] 110 (03/18 0800) Resp:  [20-40] 27 (03/18 0800) BP: (144-168)/(81-106) 156/86 (03/18 0800) SpO2:  [93 %-99 %] 94 % (03/18 0800) Last BM Date: 12/27/18  Intake/Output from previous day: 03/17 0701 - 03/18 0700 In: 4488.6 [I.V.:3041.5; IV Piggyback:1447.1] Out: 1890 [Urine:1240; Emesis/NG output:650] Intake/Output this shift: No intake/output data recorded.  PE: Abd: softer than yesterday, but still with some distention, NGT with minimal output, some BS, minimally tender  Lab Results:  Recent Labs    12/31/18 0525 01/01/19 0550  WBC 4.2 7.9  HGB 13.2 8.9*  HCT 40.7 27.1*  PLT 53* 83*   BMET Recent Labs    12/31/18 1603 01/01/19 0550  NA 130* 131*  K 3.6 4.3  CL 101 102  CO2 21* 23  GLUCOSE 61* 125*  BUN 8 7  CREATININE 0.63 0.69  CALCIUM 6.6* 6.9*   PT/INR No results for input(s): LABPROT, INR in the last 72 hours. CMP     Component Value Date/Time   NA 131 (L) 01/01/2019 0550   K 4.3 01/01/2019 0550   CL 102 01/01/2019 0550   CO2 23 01/01/2019 0550   GLUCOSE 125 (H) 01/01/2019 0550   BUN 7 01/01/2019 0550   CREATININE 0.69 01/01/2019 0550   CALCIUM 6.9 (L) 01/01/2019 0550   PROT 4.8 (L) 01/01/2019 0550   ALBUMIN 2.1 (L) 01/01/2019 0550   AST 72 (H) 01/01/2019 0550   ALT 47 (H) 01/01/2019 0550   ALKPHOS 53 01/01/2019 0550   BILITOT 2.8 (H) 01/01/2019 0550   GFRNONAA >60 01/01/2019 0550   GFRAA >60 01/01/2019 0550   Lipase     Component Value Date/Time   LIPASE 142 (H) 12/30/2018 1532       Studies/Results: Dg Abd Portable 2v  Result Date: 12/31/2018 CLINICAL DATA:  Distended abdomen.  Ileus versus small bowel obstruction. EXAM: PORTABLE ABDOMEN - 2 VIEW COMPARISON:  12/30/2018. FINDINGS: NG tube noted coiled stomach in stable position. Persistent severe distention of small bowel loops. Small bowel loops measure up to 6 cm in diameter. There is relative paucity of intra colonic gas. Small amount of stool in the right colon. Findings again suggest small bowel obstruction. No free air. IMPRESSION: 1.  NG tube in stable position. 2. Persistent severe distention of small bowel loops with relative paucity of intra colonic gas. Findings worrisome for small bowel obstruction. Electronically Signed   By: Maisie Fus  Register   On: 12/31/2018 10:29   Dg Abd Portable 2v  Result Date: 12/30/2018 CLINICAL DATA:  Abdominal distension in a patient with pancreatitis. EXAM: PORTABLE ABDOMEN - 2 VIEW COMPARISON:  Single-view of the abdomen 12/29/2018. FINDINGS: NG tube remains in place. Gaseous distention of small bowel persists without marked change. Only small volume of stool is seen in the ascending colon. IMPRESSION: Unchanged gaseous with little stool in the colon most worrisome distention of small bowel for obstruction. Electronically Signed   By: Drusilla Kanner M.D.   On: 12/30/2018 13:30    Anti-infectives: Anti-infectives (From admission, onward)   None       Assessment/Plan Ileus secondary  to pancreatitis -DC NGT and try clears from the floor today.  Will go slow with diet given history of pancreatitis to make sure he can tolerate it from both ileus and pancreatitis standpoint -mobilize and pulm toilet  -K 4.3  FEN - IVFs/sips of clears from floor VTE - Chemical prophylaxis per primary ID - none currently   LOS: 4 days    Letha Cape , [facility] Surgery 01/01/2019, 9:37 AM Pager: [phone]

## 2019-01-01 NOTE — Progress Notes (Addendum)
[facility] Progress Note   Attending physician's note   I have taken an interval history, reviewed the chart and examined the patient. I agree with the Advanced Practitioner's note, impression and recommendations.   28 year old with acute pancreatitis d/t alcohol abuse/polysubstance abuse and hypertriglyceridemia.  Had small bowel ileus requiring NG tube.  Passed flatus.  Doing well this morning. TG down to 399 this morning. Plan: -Ambulate, remove NG tube if he passes further flatus.  At the present time BSs are sluggish.  Clear liquids once the NG tube is removed. -Monitor and correct electrolytes -off insulin. -Will need fenofibrate and endocrine consult as outpatient. -Trend triglycerides and electrolytes. -He has assured me that he will not drink anymore alcohol.   Edman Circle, MD    CC:  Etoh + hypertriglyceridemia pancreatitis   Subjective: He passed a moderate amount of gas per the rectum yesterday, no BM. Feels his strength is improving. Less abdominal pain.   Objective:  Vital signs in last 24 hours: Temp:  [98 F (36.7 C)-99.7 F (37.6 C)] 98.8 F (37.1 C) (03/18 0400) Pulse Rate:  [105-122] 112 (03/18 0900) Resp:  [20-40] 22 (03/18 0900) BP: (144-168)/(81-106) 152/90 (03/18 0900) SpO2:  [93 %-99 %] 93 % (03/18 0900) Last BM Date: 12/27/18 General:   Alert,  Well-developed,    in NAD Heart: tachycardic, no murmur Pulm: lungs clear Abdomen:  Soft, distended, nontender,  few BS x 4 quads, NGT to LIS draining brown bilious drainage Extremities:  Without edema. Neurologic:  Alert and  oriented x4;  grossly normal neurologically. Psych:  Alert and cooperative. Normal mood and affect.  Intake/Output from previous day: 03/17 0701 - 03/18 0700 In: 4488.6 [I.V.:3041.5; IV Piggyback:1447.1] Out: 1890 [Urine:1240; Emesis/NG output:650] Intake/Output this shift: Total I/O In: -  Out: 200 [Urine:200]  Lab Results: Recent Labs    12/30/18 1534  12/31/18 0525 01/01/19 0550  WBC 8.6 4.2 7.9  HGB 11.9* 13.2 8.9*  HCT 34.9* 40.7 27.1*  PLT 95* 53* 83*   BMET Recent Labs    12/31/18 0525 12/31/18 1603 01/01/19 0550  NA 131* 130* 131*  K 3.0* 3.6 4.3  CL 106 101 102  CO2 17* 21* 23  GLUCOSE 148* 61* 125*  BUN 12 8 7   CREATININE 0.88 0.63 0.69  CALCIUM 5.5* 6.6* 6.9*   LFT Recent Labs    01/01/19 0550  PROT 4.8*  ALBUMIN 2.1*  AST 72*  ALT 47*  ALKPHOS 53  BILITOT 2.8*   Dg Abd Portable 2v  Result Date: 12/31/2018 CLINICAL DATA:  Distended abdomen. Ileus versus small bowel obstruction. EXAM: PORTABLE ABDOMEN - 2 VIEW COMPARISON:  12/30/2018. FINDINGS: NG tube noted coiled stomach in stable position. Persistent severe distention of small bowel loops. Small bowel loops measure up to 6 cm in diameter. There is relative paucity of intra colonic gas. Small amount of stool in the right colon. Findings again suggest small bowel obstruction. No free air. IMPRESSION: 1.  NG tube in stable position. 2. Persistent severe distention of small bowel loops with relative paucity of intra colonic gas. Findings worrisome for small bowel obstruction. Electronically Signed   By: Maisie Fus  Register   On: 12/31/2018 10:29   Dg Abd Portable 2v  Result Date: 12/30/2018 CLINICAL DATA:  Abdominal distension in a patient with pancreatitis. EXAM: PORTABLE ABDOMEN - 2 VIEW COMPARISON:  Single-view of the abdomen 12/29/2018. FINDINGS: NG tube remains in place. Gaseous distention of small bowel persists without marked change. Only  small volume of stool is seen in the ascending colon. IMPRESSION: Unchanged gaseous with little stool in the colon most worrisome distention of small bowel for obstruction. Electronically Signed   By: Drusilla Kanner M.D.   On: 12/30/2018 13:30    Assessment / Plan: 1. 28 y.o. male with EtOH pancreatitis + hypertriglyceridema. Insulin gtt dc'd with TGs 5,000 down to 399. -IVF per hospitalist -monitor electrolytes closely   -will need  Endocrinology consult as out patient   2.Small bowelIleussecondary to pancreatitis. Patient passed gas per the rectum 3/17, no BM, less abdominal distension, less abdominal pain. NGT drained 650cc bilious drainage 3/17 and 100cc this am.  -surgery recommended dc NGT, however, Dr. Chales Abrahams recommended clamp NGT, monitor patient for gast per the rectum/BM, remain NPO for now, to reassess again this afternoon -KUB if abdominal distension increases   3.Possible colitis at the hepatic flexure secondary to pancreatitis -eventual colonoscopy   4. Rectal bleeding. No current rectal bleeding -eventual colonoscopy   5. Anemia, no signs of active GI bleeding,  most likely dilutional. Hg 8.9 << 13.2.  -monitor BMs -FOBT  6. Elevated LFTs and T. bili secondary to Etoh, fatty liver and pancreatitis. Abd CT 3/14 showed fatty liver, normal gallbladder.  -follow hepatic panel -No alcohol  -follow up as outpatient   7. Hypocalcemia Ca 6.9.Hyponatremia Na 131. Hypokalemia  Improved K 4.3.  Hypophosphotemia Phos 2.4. Hypoglycemia Glu 57-125 --Calcium Gluconate, KCL, Phos, Mg replacement per hospitalist   7. Alchol abuse -CIWA protocol      LOS: 4 days   Arnaldo Natal  01/01/2019, 9:47 AM

## 2019-01-01 NOTE — Progress Notes (Signed)
Orders per Surgery to DC NG tube, per GI NP and MD NG clamped for now.

## 2019-01-01 NOTE — Progress Notes (Signed)
PROGRESS NOTE    Charles Dougherty  WUJ:[id number] DOB: Jan 10, 1991 DOA: 12/27/2018 PCP: Patient, No Pcp Per    Brief Narrative:  Patient is a 28 year old male with history of alcohol abuse, substance abuse, smoker who presents to the emergencydepartment with complaints of abdominal pain. Admitted for the management of acute pancreatitis related to alcohol. Incidentally found to have severe hypertriglyceridemia.He has positive family history.  Hospital course remarkable for abdominal discomfort, distention .Abdominal x-ray suggested early bowel obstruction.  General surgery consulted and he was started on conservative management with NG tube decompression, n.p.o. status. Unclear if the pancreatitis was precipitated by alcohol or hypertriglyceridemia.  Due to severe hypertriglyceridemia ,we started on insulin drip and moved to stepdown.  GI also consulted.   Assessment & Plan:   Principal Problem:   Acute alcoholic pancreatitis Active Problems:   Hypertriglyceridemia   Pancreatitis   ETOH abuse   Drug abuse (HCC)   Cystitis   Ileus (HCC)   Abdominal pain   SBO (small bowel obstruction) (HCC)   Hypophosphatemia   Hypomagnesemia   Hypocalcemia   AKI (acute kidney injury) (HCC)   Anemia  1 acute alcoholic pancreatitis versus acute pancreatitis precipitated by hypertriglyceridemia Patient had presented with abdominal pain nausea and vomiting noted to have significantly elevated lipase levels and transaminitis.  Patient noted to be every day drinker usually drinks beer.  Patient placed on conservative management with IV fluids, n.p.o. and pain management.  CT abdomen and pelvis which was done did show infiltration and edema around the head and body of the pancreas consistent with acute edematous interstitial pancreatitis.  No loculated fluid collections, no evidence of pancreatic necrosis.  Patient currently with NG tube in place states some improvement with abdominal pain.  Blood pressure  slightly elevated likely secondary to current pain.  Continue IV fluids, supportive care, pain management.  NG tube to be removed per general surgery today and patient to be tried on clears.  GI following and appreciate input and recommendations.  2.  Severe hypertriglyceridemia Patient with family history of hypertriglyceridemia.  Patient's uncle noted to have this.  Patient noted to have triglycerides levels greater than 5000 with repeat labs confirming it.  Patient was placed on the insulin drip with significant improvement with elevated triglyceride levels.  Triglycerides this morning at 399.  Insulin drip has been discontinued as patient was starting to have hypoglycemia and triglyceride levels less than 500.  Continue IV fluids.  Will need to be on the fibroid on discharge.  Will need outpatient follow-up with PCP and probable referral to endocrinology.  Alcohol cessation has been stressed to patient.  3.  Small bowel obstruction versus ileus secondary to pancreatitis Patient with 3 cc output from NG tube over the past 24 hours and 100 cc out this morning.  No abdominal films were obtained this morning.  Patient passing flatus.  Patient with no bowel movement.  Patient still with some epigastric abdominal pain.  Patient being followed by general surgery and GI and general surgery recommending NG tube be removed today with trial of clears and mobilization.  Keep magnesium greater than 2.  Keep potassium greater than 4.  Per general surgery and GI.  4.  Hypophosphatemia/hypokalemia Repleted.  Phosphorus level at 2.4.  We will give sodium phosphate 30 mmol IV times 1 repeat levels in the morning.  5.  Anemia Likely dilutional.  Patient with no overt bleeding.  Anemia panel consistent with anemia of chronic disease.  Ferritin elevated at 1383.  Follow H&H.  Transfusion threshold hemoglobin less than 7.  Patient with a history of alcohol use and as such we will place on PPI.  GI following.  6.   Hypocalcemia/hypoalbuminemia Hypocalcemia associated likely with pancreatitis.  Patient also noted to have a severe hypoalbuminemia of 1.9.  Corrected calcium at 8.42.  Give a dose of IV calcium gluconate 2 g IV x1.  Follow.  7.  Transaminitis Images done did not show any gallbladder stones or inflammation.  Patient noted to have diffuse fatty liver.  LFTs improving.  Follow.  8.  Thrombocytopenia Likely secondary to alcoholic liver disease.  Lovenox discontinued.  No overt bleeding.  Follow.  9.  Suspected cystitis Per CT imaging.  Patient currently asymptomatic.  Urinalysis unremarkable.  No need for antibiotics at this time.  10.  Acute kidney injury Improving with hydration.  11.  Hypoglycemic events Felt secondary to insulin drip.  Patient also noted to be n.p.o.  Insulin drip has been discontinued.  Continue D5 normal saline.  Continue CBGs every 4 hours for the next 24 hours and if CBGs improved could change to before meals and at bedtime.  Likely transition to normal saline tomorrow monitor blood glucose levels.  12 alcohol abuse No signs of withdrawal.  Alcohol cessation stressed to patient.  Social work consulted.  Follow.   DVT prophylaxis: SCDs Code Status: Full Family Communication: Updated patient.  No family at bedside. Disposition Plan: Remain in stepdown unit.   Consultants:   General surgery: Dr.Tsuei 12/29/2018  Gastroenterology: Dr. Barron Alvine 12/30/2018  Procedures:   CT abdomen and pelvis 12/28/2018  Right upper quadrant ultrasound 12/27/2018  Abdominal films 12/29/2018, 12/30/2018, 12/31/2018  Antimicrobials:   None   Subjective: Patient sitting up in bed.  NG tube in place.  Denies any nausea or vomiting.  Feels abdominal distention improving.  Feels abdominal pain improving however still requiring IV pain medication frequently per RN.  Patient passing flatus.  No bowel movement.  Bilious drainage of 650 cc over the past 24 hours in the 100 cc this  morning.  Patient noted yesterday to have a CBG of 51 and asymptomatic.  Insulin drip discontinued.  Objective: Vitals:   01/01/19 0647 01/01/19 0800 01/01/19 0900 01/01/19 1028  BP:  (!) 156/86 (!) 152/90   Pulse: (!) 118 (!) 110 (!) 112   Resp: (!) 29 (!) 27 (!) 22 (!) 23  Temp:      TempSrc:      SpO2: 94% 94% 93%   Weight:      Height:        Intake/Output Summary (Last 24 hours) at 01/01/2019 1048 Last data filed at 01/01/2019 0943 Gross per 24 hour  Intake 3446.88 ml  Output 2090 ml  Net 1356.88 ml   Filed Weights   12/27/18 2143 12/28/18 0337 12/30/18 1100  Weight: 70.3 kg 69.7 kg 78 kg    Examination:  General exam: Appears calm and comfortable.  NG tube in place. Respiratory system: Clear to auscultation. Respiratory effort normal. Cardiovascular system: S1 & S2 heard, RRR. No JVD, murmurs, rubs, gallops or clicks. No pedal edema. Gastrointestinal system: Abdomen is mildly distended, hypoactive bowel sounds, some tenderness to palpation in the epigastric region.   Central nervous system: Alert and oriented. No focal neurological deficits. Extremities: Symmetric 5 x 5 power. Skin: No rashes, lesions or ulcers Psychiatry: Judgement and insight appear normal. Mood & affect appropriate.     Data Reviewed: I have personally reviewed following labs and imaging  studies  CBC: Recent Labs  Lab 12/28/18 0524 12/29/18 1116 12/30/18 1534 12/31/18 0525 01/01/19 0550  WBC 8.6 7.6 8.6 4.2 7.9  NEUTROABS  --  6.3 6.5 2.9 6.0  HGB 14.8 16.6 11.9* 13.2 8.9*  HCT 43.8 45.0 34.9* 40.7 27.1*  MCV 102.1* 102.0* 102.0* 102.8* 101.1*  PLT 92* 84* 95* 53* 83*   Basic Metabolic Panel: Recent Labs  Lab 12/28/18 0524  12/30/18 0504 12/30/18 1532 12/31/18 0525 12/31/18 1603 01/01/19 0550  NA 137   < > 133* 132* 131* 130* 131*  K 3.5   < > 5.2* 5.4* 3.0* 3.6 4.3  CL 109   < > 111 109 106 101 102  CO2 16*   < > 14* 15* 17* 21* 23  GLUCOSE 108*   < > 119* 52* 148* 61*  125*  BUN 6   < > 20 20 12 8 7   CREATININE 1.10   < > 1.29* 1.12 0.88 0.63 0.69  CALCIUM 7.0*   < > 4.8* 5.4* 5.5* 6.6* 6.9*  MG 1.7  --   --  2.5* 2.0 2.1 2.0  PHOS 2.9  --   --  1.5* 1.1* 2.0* 2.4*   < > = values in this interval not displayed.   GFR: Estimated Creatinine Clearance: 145.7 mL/min (by C-G formula based on SCr of 0.69 mg/dL). Liver Function Tests: Recent Labs  Lab 12/27/18 2203 12/28/18 0524 12/29/18 1116 12/30/18 0504 01/01/19 0550  AST 158* 105* 87* 86* 72*  ALT 150* 117* 61* 44 47*  ALKPHOS 97 78 63 46 53  BILITOT 1.1 1.9* 1.3* 2.5* 2.8*  PROT 6.1* 5.5* 4.9* 4.3* 4.8*  ALBUMIN 3.8 3.2* 2.1* 1.9* 2.1*   Recent Labs  Lab 12/27/18 2203 12/28/18 0524 12/30/18 1532  LIPASE 209* 840* 142*   No results for input(s): AMMONIA in the last 168 hours. Coagulation Profile: Recent Labs  Lab 12/28/18 0524  INR 0.9   Cardiac Enzymes: No results for input(s): CKTOTAL, CKMB, CKMBINDEX, TROPONINI in the last 168 hours. BNP (last 3 results) No results for input(s): PROBNP in the last 8760 hours. HbA1C: Recent Labs    12/30/18 0843  HGBA1C 4.9   CBG: Recent Labs  Lab 12/31/18 1902 12/31/18 1954 12/31/18 2326 01/01/19 0354 01/01/19 0729  GLUCAP 117* 127* 111* 110* 116*   Lipid Profile: Recent Labs    12/31/18 1603 01/01/19 0550  TRIG 485* 399*   Thyroid Function Tests: Recent Labs    12/30/18 0843  TSH 2.923   Anemia Panel: Recent Labs    12/31/18 0525  VITAMINB12 1,606*  FERRITIN 1,383*  TIBC 116*  IRON 32*   Sepsis Labs: Recent Labs  Lab 12/30/18 0843  LATICACIDVEN 1.5    Recent Results (from the past 240 hour(s))  Urine culture     Status: None   Collection Time: 12/28/18  5:04 AM  Result Value Ref Range Status   Specimen Description   Final    URINE, RANDOM Performed at [facility], [address], [place], [place] [address]    Special Requests   Final    NONE Performed at [facility], [address], [place], [place] [address]    Culture   Final    NO GROWTH Performed at [facility], [address] [place], [place] [address]    Report Status 12/29/2018 FINAL  Final  MRSA PCR Screening     Status: None   Collection Time: 12/30/18  11:24 AM  Result Value Ref Range Status   MRSA by PCR NEGATIVE NEGATIVE Final    Comment:        The GeneXpert MRSA Assay (FDA approved for NASAL specimens only), is one component of a comprehensive MRSA colonization surveillance program. It is not intended to diagnose MRSA infection nor to guide or monitor treatment for MRSA infections. Performed at [facility], [address] [place], [place] [address]          Radiology Studies: Dg Abd Portable 2v  Result Date: 12/31/2018 CLINICAL DATA:  Distended abdomen. Ileus versus small bowel obstruction. EXAM: PORTABLE ABDOMEN - 2 VIEW COMPARISON:  12/30/2018. FINDINGS: NG tube noted coiled stomach in stable position. Persistent severe distention of small bowel loops. Small bowel loops measure up to 6 cm in diameter. There is relative paucity of intra colonic gas. Small amount of stool in the right colon. Findings again suggest small bowel obstruction. No free air. IMPRESSION: 1.  NG tube in stable position. 2. Persistent severe distention of small bowel loops with relative paucity of intra colonic gas. Findings worrisome for small bowel obstruction. Electronically Signed   By: Maisie Fus  Register   On: 12/31/2018 10:29   Dg Abd Portable 2v  Result Date: 12/30/2018 CLINICAL DATA:  Abdominal distension in a patient with pancreatitis. EXAM: PORTABLE ABDOMEN - 2 VIEW COMPARISON:  Single-view of the abdomen 12/29/2018. FINDINGS: NG tube remains in place. Gaseous distention of small bowel persists without marked change. Only small volume of stool is seen in the ascending colon. IMPRESSION: Unchanged gaseous with little stool in the colon most worrisome distention  of small bowel for obstruction. Electronically Signed   By: Drusilla Kanner M.D.   On: 12/30/2018 13:30        Scheduled Meds: . Chlorhexidine Gluconate Cloth  6 each Topical Daily   Continuous Infusions: . sodium chloride Stopped (12/31/18 0833)  . dextrose 5 % and 0.9% NaCl 125 mL/hr at 01/01/19 0906  . sodium phosphate  Dextrose 5% IVPB 30 mmol (01/01/19 1027)     LOS: 4 days    Time spent: 40 mins    Ramiro Harvest, MD [facility]  If 7PM-7AM, please contact night-coverage [email or web address] 01/01/2019, 10:48 AM

## 2019-01-01 NOTE — Progress Notes (Signed)
Patient ID: Charles Dougherty, male   DOB: [birth year], 28 y.o.   MRN: [id number]   Brief GI update note: Patient passed small amounts of gas per the rectum today. Abdomen remains distended. NGT clamped during the day. He developed nausea and increased abdominal discomfort therefore NGT was placed back to suction, drained 300cc, total of 400cc bilious fluid drained since 7am.  Abd Xray without improvement, small bowel loops remain severely dilated centrally but number of dilated small bowel loops has decreased, small amount of stool in the right and left colon and rectum.  Ok to clamp NGT this evening. Allow sips of clear liquids if tolerated. If nausea or abd distension recurs then NGT back to LIS and NPO. Repeat abd xray in am.

## 2019-01-01 NOTE — Progress Notes (Signed)
Patient unable to tolerate clamped NGT. Increased pain and distention noted. NGT connected to low-intermittent suction. Upon connecting, about 50-100 mLs of stomach contents released, patient stated to feeling "immediate relief". Surgeon made aware during his rounds.  Will continue to monitor.

## 2019-01-02 ENCOUNTER — Inpatient Hospital Stay (HOSPITAL_COMMUNITY): Payer: Federal, State, Local not specified - PPO

## 2019-01-02 DIAGNOSIS — D649 Anemia, unspecified: Secondary | ICD-10-CM

## 2019-01-02 DIAGNOSIS — E781 Pure hyperglyceridemia: Secondary | ICD-10-CM

## 2019-01-02 DIAGNOSIS — K567 Ileus, unspecified: Secondary | ICD-10-CM

## 2019-01-02 LAB — HEPATIC FUNCTION PANEL
ALT: 41 U/L (ref 0–44)
AST: 53 U/L — ABNORMAL HIGH (ref 15–41)
Albumin: 2 g/dL — ABNORMAL LOW (ref 3.5–5.0)
Alkaline Phosphatase: 57 U/L (ref 38–126)
Bilirubin, Direct: 1.3 mg/dL — ABNORMAL HIGH (ref 0.0–0.2)
Indirect Bilirubin: 0.5 mg/dL (ref 0.3–0.9)
Total Bilirubin: 1.8 mg/dL — ABNORMAL HIGH (ref 0.3–1.2)
Total Protein: 4.8 g/dL — ABNORMAL LOW (ref 6.5–8.1)

## 2019-01-02 LAB — HEPATITIS PANEL, ACUTE
HCV Ab: <0.1 s/co ratio (ref 0.0–0.9)
Hep A IgM: NEGATIVE
Hep B C IgM: NEGATIVE
Hepatitis B Surface Ag: NEGATIVE

## 2019-01-02 LAB — GLUCOSE, CAPILLARY
Glucose-Capillary: 115 mg/dL — ABNORMAL HIGH (ref 70–99)
Glucose-Capillary: 102 mg/dL — ABNORMAL HIGH (ref 70–99)
Glucose-Capillary: 116 mg/dL — ABNORMAL HIGH (ref 70–99)
Glucose-Capillary: 100 mg/dL — ABNORMAL HIGH (ref 70–99)
Glucose-Capillary: 78 mg/dL (ref 70–99)
Glucose-Capillary: 102 mg/dL — ABNORMAL HIGH (ref 70–99)

## 2019-01-02 LAB — CBC WITH DIFFERENTIAL/PLATELET
Abs Immature Granulocytes: 0.51 10*3/uL — ABNORMAL HIGH (ref 0.00–0.07)
Basophils Absolute: 0 10*3/uL (ref 0.0–0.1)
Basophils Relative: 0 %
Eosinophils Absolute: 0.1 10*3/uL (ref 0.0–0.5)
Eosinophils Relative: 1 %
HCT: 21.9 % — ABNORMAL LOW (ref 39.0–52.0)
Hemoglobin: 7.1 g/dL — ABNORMAL LOW (ref 13.0–17.0)
IMMATURE GRANULOCYTES: 5 %
LYMPHS ABS: 0.6 10*3/uL — AB (ref 0.7–4.0)
Lymphocytes Relative: 6 %
MCH: 33.2 pg (ref 26.0–34.0)
MCHC: 32.4 g/dL (ref 30.0–36.0)
MCV: 102.3 fL — ABNORMAL HIGH (ref 80.0–100.0)
Monocytes Absolute: 0.6 10*3/uL (ref 0.1–1.0)
Monocytes Relative: 6 %
Neutro Abs: 8.1 10*3/uL — ABNORMAL HIGH (ref 1.7–7.7)
Neutrophils Relative %: 82 %
Platelets: 109 10*3/uL — ABNORMAL LOW (ref 150–400)
RBC: 2.14 MIL/uL — ABNORMAL LOW (ref 4.22–5.81)
RDW: 13.8 % (ref 11.5–15.5)
WBC: 10 10*3/uL (ref 4.0–10.5)
nRBC: 0.4 % — ABNORMAL HIGH (ref 0.0–0.2)

## 2019-01-02 LAB — HEMOGLOBIN AND HEMATOCRIT, BLOOD
HCT: 27.7 % — ABNORMAL LOW (ref 39.0–52.0)
Hemoglobin: 9.2 g/dL — ABNORMAL LOW (ref 13.0–17.0)

## 2019-01-02 LAB — BASIC METABOLIC PANEL
Anion gap: 9 (ref 5–15)
BUN: 8 mg/dL (ref 6–20)
CALCIUM: 7.4 mg/dL — AB (ref 8.9–10.3)
CO2: 23 mmol/L (ref 22–32)
Chloride: 103 mmol/L (ref 98–111)
Creatinine, Ser: 0.61 mg/dL (ref 0.61–1.24)
GFR calc Af Amer: >60 mL/min (ref >60)
Glucose, Bld: 118 mg/dL — ABNORMAL HIGH (ref 70–99)
POTASSIUM: 3.8 mmol/L (ref 3.5–5.1)
Sodium: 135 mmol/L (ref 135–145)

## 2019-01-02 LAB — LIPASE, BLOOD: Lipase: 67 U/L — ABNORMAL HIGH (ref 11–51)

## 2019-01-02 LAB — PHOSPHORUS: Phosphorus: 2.8 mg/dL (ref 2.5–4.6)

## 2019-01-02 LAB — ABO/RH: ABO/RH(D): A POS

## 2019-01-02 LAB — PREPARE RBC (CROSSMATCH)

## 2019-01-02 LAB — TRIGLYCERIDES: TRIGLYCERIDES: 375 mg/dL — AB (ref <150)

## 2019-01-02 MED ORDER — SODIUM CHLORIDE 0.9 % IV SOLN
INTRAVENOUS | Status: DC | PRN
  Administered 2019-01-02: 250 mL via INTRAVENOUS
  Administered 2019-01-08: 1000 mL via INTRAVENOUS

## 2019-01-02 MED ORDER — POTASSIUM CHLORIDE 10 MEQ/100ML IV SOLN
10.0000 meq | INTRAVENOUS | Status: AC
  Administered 2019-01-02 (×3): 10 meq via INTRAVENOUS
  Filled 2019-01-02 (×3): qty 100

## 2019-01-02 MED ORDER — ACETAMINOPHEN 650 MG RE SUPP
325.0000 mg | Freq: Once | RECTAL | Status: AC
  Administered 2019-01-02: 325 mg via RECTAL
  Filled 2019-01-02: qty 1

## 2019-01-02 MED ORDER — NICOTINE 14 MG/24HR TD PT24
14.0000 mg | MEDICATED_PATCH | Freq: Every day | TRANSDERMAL | Status: DC
  Administered 2019-01-02 – 2019-01-19 (×18): 14 mg via TRANSDERMAL
  Filled 2019-01-02 (×17): qty 1

## 2019-01-02 MED ORDER — SODIUM CHLORIDE 0.9% IV SOLUTION
Freq: Once | INTRAVENOUS | Status: AC
  Administered 2019-01-02: 14:00:00 via INTRAVENOUS

## 2019-01-02 MED ORDER — DIPHENHYDRAMINE HCL 50 MG/ML IJ SOLN
25.0000 mg | Freq: Once | INTRAMUSCULAR | Status: AC
  Administered 2019-01-02: 25 mg via INTRAVENOUS
  Filled 2019-01-02: qty 1

## 2019-01-02 MED ORDER — PANTOPRAZOLE SODIUM 40 MG IV SOLR
40.0000 mg | Freq: Two times a day (BID) | INTRAVENOUS | Status: DC
  Administered 2019-01-02 – 2019-01-10 (×18): 40 mg via INTRAVENOUS
  Filled 2019-01-02 (×18): qty 40

## 2019-01-02 MED ORDER — ACETAMINOPHEN 325 MG PO TABS
650.0000 mg | ORAL_TABLET | Freq: Once | ORAL | Status: DC
  Filled 2019-01-02: qty 2

## 2019-01-02 NOTE — Progress Notes (Addendum)
PROGRESS NOTE    Linas Stepter  NWG:[id number] DOB: December 13, 1990 DOA: 12/27/2018 PCP: Patient, No Pcp Per    Brief Narrative:  Patient is a 28 year old male with history of alcohol abuse, substance abuse, smoker who presents to the emergencydepartment with complaints of abdominal pain. Admitted for the management of acute pancreatitis related to alcohol. Incidentally found to have severe hypertriglyceridemia.He has positive family history.  Hospital course remarkable for abdominal discomfort, distention .Abdominal x-ray suggested early bowel obstruction.  General surgery consulted and he was started on conservative management with NG tube decompression, n.p.o. status. Unclear if the pancreatitis was precipitated by alcohol or hypertriglyceridemia.  Due to severe hypertriglyceridemia ,we started on insulin drip and moved to stepdown.  GI also consulted.   Assessment & Plan:   Principal Problem:   Acute alcoholic pancreatitis Active Problems:   Hypertriglyceridemia   Pancreatitis   ETOH abuse   Drug abuse (HCC)   Cystitis   Ileus (HCC)   Abdominal pain   SBO (small bowel obstruction) (HCC)   Hypophosphatemia   Hypomagnesemia   Hypocalcemia   AKI (acute kidney injury) (HCC)   Anemia  1 acute alcoholic pancreatitis versus acute pancreatitis precipitated by hypertriglyceridemia Patient had presented with abdominal pain nausea and vomiting noted to have significantly elevated lipase levels and transaminitis.  Patient noted to be every day drinker usually drinks beer.  Patient placed on conservative management with IV fluids, n.p.o. and pain management.  CT abdomen and pelvis which was done did show infiltration and edema around the head and body of the pancreas consistent with acute edematous interstitial pancreatitis.  No loculated fluid collections, no evidence of pancreatic necrosis.  Patient currently with NG tube in place states some improvement with abdominal pain.  Blood pressure  slightly elevated likely secondary to current pain.  Continue IV fluids, supportive care, pain management.  NG tube was to be discontinued yesterday and was clamped however patient unable to tolerate and as such NG tube was placed back to suction.  NG tube with output of 1100 cc over the past 24 hours.  Continue NG tube, IV fluids, supportive care, ambulation.  GI and general surgery following and appreciate input and recommendations.   2.  Severe hypertriglyceridemia Patient with family history of hypertriglyceridemia.  Patient's uncle noted to have this.  Patient noted to have triglycerides levels greater than 5000 with repeat labs confirming it.  Patient was placed on the insulin drip with significant improvement with elevated triglyceride levels.  Triglycerides this morning at 375.  Insulin drip has been discontinued as patient was starting to have hypoglycemia and triglyceride levels less than 500.  Continue IV fluids.  Will need to be on the fenofibrate on discharge.  Will need outpatient follow-up with PCP and probable referral to endocrinology.  Alcohol cessation has been stressed to patient.  3.  Small bowel obstruction versus ileus secondary to pancreatitis Patient with1100cc output from NG tube over the past 24 hours and 150 cc out this morning.  Abdominal films with no significant change with dilated loops of bowel.  Patient passing flatus.  Patient stated had a bowel movement last night.  States some improvement with epigastric abdominal pain.  Clamping trial was done yesterday however patient unable to tolerate with increasing abdominal pain and as such NG tube was placed to suction.  Continue NG tube, IV fluids, ambulation.  Keep magnesium greater than 2.  Keep potassium greater than 4.  GI and general surgery following and appreciate input and recommendations.  4.  Hypophosphatemia/hypokalemia Repleted.  Phosphorus level at 2.8.   5.  Anemia Likely dilutional.  Patient with no overt  bleeding.  Anemia panel consistent with anemia of chronic disease/iron deficiency anemia.  Iron level at 32.  Ferritin elevated at 1383.  Hemoglobin currently at 7.1 from 8.9 from 13.2.  FOBT pending.  Patient with a history of alcohol use and as such we will place on PPI.  Will transfuse a unit of packed red blood cells.  May need IV Feraheme.??  Upper endoscopy.  GI following.  6.  Hypocalcemia/hypoalbuminemia Hypocalcemia associated likely with pancreatitis.  Patient also noted to have a severe hypoalbuminemia of 1.9.  Corrected calcium at 9.  Patient given some doses of IV calcium gluconate.  Follow.   7.  Transaminitis Images done did not show any gallbladder stones or inflammation.  Patient noted to have diffuse fatty liver.  LFTs trending down.  Follow.    8.  Thrombocytopenia Likely secondary to alcoholic liver disease.  Lovenox discontinued.  No overt bleeding.  Platelet count improving currently at 109.  Follow.  9.  Suspected cystitis Per CT imaging.  Patient currently asymptomatic.  Urinalysis unremarkable.  No need for antibiotics at this time.  10.  Acute kidney injury Resolved with hydration.  Follow.   11.  Hypoglycemic events Felt secondary to insulin drip.  Patient also noted to be n.p.o.  Insulin drip has been discontinued.  Continue D5 normal saline.  Continue CBGs every 4 hours patient still n.p.o.  Follow.   12 alcohol abuse No signs of withdrawal.  Alcohol cessation stressed to patient.  Social work consulted.  Follow.   DVT prophylaxis: SCDs Code Status: Full Family Communication: Updated patient.  No family at bedside. Disposition Plan: Transfer to MedSurg.   Consultants:   General surgery: Dr.Tsuei 12/29/2018  Gastroenterology: Dr. Barron Alvine 12/30/2018  Procedures:   CT abdomen and pelvis 12/28/2018  Right upper quadrant ultrasound 12/27/2018  Abdominal films 12/29/2018, 12/30/2018, 12/31/2018  Antimicrobials:   None   Subjective: Patient sitting  up in bed.  NG tube in place.  Denies any nausea or vomiting.  Feels abdominal distention improving.  Patient states had a bowel movement last night and passing flatus.  Patient denies any overt bleeding.  Patient states had sharp right lower quadrant pain when leaning forward yesterday.  Objective: Vitals:   01/02/19 0400 01/02/19 0600 01/02/19 0800 01/02/19 0813  BP: 137/77 135/64 (!) 155/72   Pulse:      Resp: 19 (!) 22 18   Temp: 98.4 F (36.9 C)  98.7 F (37.1 C)   TempSrc: Oral  Oral   SpO2:   91%   Weight:    81 kg  Height:        Intake/Output Summary (Last 24 hours) at 01/02/2019 0941 Last data filed at 01/02/2019 0814 Gross per 24 hour  Intake 3656.94 ml  Output 2125 ml  Net 1531.94 ml   Filed Weights   12/28/18 0337 12/30/18 1100 01/02/19 0813  Weight: 69.7 kg 78 kg 81 kg    Examination:  General exam: NAD.  NG tube in place.  Respiratory system: Lungs clear to auscultation bilaterally.  No wheezes, no crackles, no rhonchi.  Cardiovascular system: Regular rate and rhythm no murmurs rubs or gallops.  No JVD.  No lower extremity edema.  Gastrointestinal system: Abdomen is mildly distended, hypoactive bowel sounds, decreased tenderness to palpation in the epigastric region. Central nervous system: Alert and oriented. No focal neurological deficits. Extremities: Symmetric  5 x 5 power. Skin: No rashes, lesions or ulcers Psychiatry: Judgement and insight appear normal. Mood & affect appropriate.     Data Reviewed: I have personally reviewed following labs and imaging studies  CBC: Recent Labs  Lab 12/28/18 0524 12/29/18 1116 12/30/18 1534 12/31/18 0525 01/01/19 0550  WBC 8.6 7.6 8.6 4.2 7.9  NEUTROABS  --  6.3 6.5 2.9 6.0  HGB 14.8 16.6 11.9* 13.2 8.9*  HCT 43.8 45.0 34.9* 40.7 27.1*  MCV 102.1* 102.0* 102.0* 102.8* 101.1*  PLT 92* 84* 95* 53* 83*   Basic Metabolic Panel: Recent Labs  Lab 12/28/18 0524  12/30/18 1532 12/31/18 0525 12/31/18 1603  01/01/19 0550 01/01/19 1638 01/02/19 0622  NA 137   < > 132* 131* 130* 131* 134* 135  K 3.5   < > 5.4* 3.0* 3.6 4.3 3.8 3.8  CL 109   < > 109 106 101 102 103 103  CO2 16*   < > 15* 17* 21* 23 24 23   GLUCOSE 108*   < > 52* 148* 61* 125* 126* 118*  BUN 6   < > 20 12 8 7 8 8   CREATININE 1.10   < > 1.12 0.88 0.63 0.69 0.59* 0.61  CALCIUM 7.0*   < > 5.4* 5.5* 6.6* 6.9* 7.1* 7.4*  MG 1.7  --  2.5* 2.0 2.1 2.0  --   --   PHOS 2.9  --  1.5* 1.1* 2.0* 2.4*  --  2.8   < > = values in this interval not displayed.   GFR: Estimated Creatinine Clearance: 145.7 mL/min (by C-G formula based on SCr of 0.61 mg/dL). Liver Function Tests: Recent Labs  Lab 12/28/18 0524 12/29/18 1116 12/30/18 0504 01/01/19 0550 01/02/19 0622  AST 105* 87* 86* 72* 53*  ALT 117* 61* 44 47* 41  ALKPHOS 78 63 46 53 57  BILITOT 1.9* 1.3* 2.5* 2.8* 1.8*  PROT 5.5* 4.9* 4.3* 4.8* 4.8*  ALBUMIN 3.2* 2.1* 1.9* 2.1* 2.0*   Recent Labs  Lab 12/27/18 2203 12/28/18 0524 12/30/18 1532 01/02/19 0622  LIPASE 209* 840* 142* 67*   No results for input(s): AMMONIA in the last 168 hours. Coagulation Profile: Recent Labs  Lab 12/28/18 0524  INR 0.9   Cardiac Enzymes: No results for input(s): CKTOTAL, CKMB, CKMBINDEX, TROPONINI in the last 168 hours. BNP (last 3 results) No results for input(s): PROBNP in the last 8760 hours. HbA1C: No results for input(s): HGBA1C in the last 72 hours. CBG: Recent Labs  Lab 01/01/19 1552 01/01/19 1953 01/01/19 2355 01/02/19 0352 01/02/19 0734  GLUCAP 113* 120* 109* 115* 102*   Lipid Profile: Recent Labs    01/01/19 0550 01/02/19 0622  TRIG 399* 375*   Thyroid Function Tests: No results for input(s): TSH, T4TOTAL, FREET4, T3FREE, THYROIDAB in the last 72 hours. Anemia Panel: Recent Labs    12/31/18 0525  VITAMINB12 1,606*  FERRITIN 1,383*  TIBC 116*  IRON 32*   Sepsis Labs: Recent Labs  Lab 12/30/18 0843  LATICACIDVEN 1.5    Recent Results (from the past  240 hour(s))  Urine culture     Status: None   Collection Time: 12/28/18  5:04 AM  Result Value Ref Range Status   Specimen Description   Final    URINE, RANDOM Performed at [facility], [address], [place], [place] [address]    Special Requests   Final    NONE Performed at [facility], [address] [place],  [place] [address]    Culture   Final    NO GROWTH Performed at [facility], [address] [place], [place] [address]    Report Status 12/29/2018 FINAL  Final  MRSA PCR Screening     Status: None   Collection Time: 12/30/18 11:24 AM  Result Value Ref Range Status   MRSA by PCR NEGATIVE NEGATIVE Final    Comment:        The GeneXpert MRSA Assay (FDA approved for NASAL specimens only), is one component of a comprehensive MRSA colonization surveillance program. It is not intended to diagnose MRSA infection nor to guide or monitor treatment for MRSA infections. Performed at [facility], [address] [place], [place] [address]          Radiology Studies: Dg Abd Portable 2v  Result Date: 01/02/2019 CLINICAL DATA:  Ileus. EXAM: PORTABLE ABDOMEN - 2 VIEW COMPARISON:  01/01/2019.  12/31/2018. FINDINGS: NG tube noted with tip over the stomach in stable position. Soft tissue structures are unremarkable. Severe distention of small bowel loops again noted. Relative paucity of colonic gas noted. Stool in the colon. Findings again worrisome for small bowel obstruction. No significant interim change noted from prior exam. IMPRESSION: NG tube noted with tip over the stomach in stable position. Severe distention of small bowel loops worrisome for small bowel obstruction again noted. No significant interim change from prior exam. Electronically Signed   By: Maisie Fushomas  Register   On: 01/02/2019 06:05   Dg Abd Portable 2v  Result Date: 01/01/2019 CLINICAL DATA:  Follow-up ileus. EXAM: PORTABLE ABDOMEN - 2 VIEW  COMPARISON:  Abdominal x-rays from yesterday. FINDINGS: Unchanged enteric tube. Interval decrease in the number, but not the caliber of small bowel loops, which remain severely dilated centrally. A small amount of stool is seen in the right and left colon and rectum. No pneumoperitoneum. No acute osseous abnormality. IMPRESSION: 1. Persistent severe small bowel dilatation, although the number of dilated small bowel loops has decreased when compared to prior study. Electronically Signed   By: Obie DredgeWilliam T Derry M.D.   On: 01/01/2019 14:55   Dg Abd Portable 2v  Result Date: 12/31/2018 CLINICAL DATA:  Distended abdomen. Ileus versus small bowel obstruction. EXAM: PORTABLE ABDOMEN - 2 VIEW COMPARISON:  12/30/2018. FINDINGS: NG tube noted coiled stomach in stable position. Persistent severe distention of small bowel loops. Small bowel loops measure up to 6 cm in diameter. There is relative paucity of intra colonic gas. Small amount of stool in the right colon. Findings again suggest small bowel obstruction. No free air. IMPRESSION: 1.  NG tube in stable position. 2. Persistent severe distention of small bowel loops with relative paucity of intra colonic gas. Findings worrisome for small bowel obstruction. Electronically Signed   By: Maisie Fushomas  Register   On: 12/31/2018 10:29        Scheduled Meds:  Chlorhexidine Gluconate Cloth  6 each Topical Daily   nicotine  14 mg Transdermal Daily   Continuous Infusions:  sodium chloride 250 mL (01/02/19 0932)   dextrose 5 % and 0.9% NaCl 100 mL/hr at 01/02/19 0817   potassium chloride 10 mEq (01/02/19 0933)     LOS: 5 days    Time spent: 40 mins    Ramiro Harvestaniel Noemie Devivo, MD [facility]  If 7PM-7AM, please contact night-coverage [email or web address] 01/02/2019, 9:41 AM

## 2019-01-02 NOTE — Progress Notes (Addendum)
[facility] Progress Note  CC:   Etoh + hypertriglyceridemia pancreatitis    Subjective: He passed a solid BM last night. He continues to feel tight in his abdomen. Had sharp RLQ pain when leaning forward, no current RLQ pain.   Objective:  Vital signs in last 24 hours: Temp:  [98.2 F (36.8 C)-99.7 F (37.6 C)] 98.7 F (37.1 C) (03/19 0800) Pulse Rate:  [104-120] 114 (03/18 2040) Resp:  [18-30] 18 (03/19 0800) BP: (135-163)/(64-91) 155/72 (03/19 0800) SpO2:  [91 %-96 %] 91 % (03/19 0800) Weight:  [81 kg] 81 kg (03/19 0813) Last BM Date: 01/02/19 General:   Alert,  Well-developed, ambulating up in hallway with assistance Heart:  Regular rate and rhythm; no murmurs Pulm: Lungs clear throughout  Abdomen: distended, mild generalized tenderness throughout without rebound or guarding, few bowel sounds Extremities:  LUE with mild edema  Neurologic:  Alert and  oriented x4;  grossly normal neurologically. Psych:  Alert and cooperative. Normal mood and affect.  Intake/Output from previous day: 03/18 0701 - 03/19 0700 In: 3656.9 [P.O.:240; I.V.:3057; IV Piggyback:359.9] Out: 1975 [Urine:875; Emesis/NG output:1100] Intake/Output this shift: Total I/O In: -  Out: 150 [Urine:150]  Lab Results: Recent Labs    12/30/18 1534 12/31/18 0525 01/01/19 0550  WBC 8.6 4.2 7.9  HGB 11.9* 13.2 8.9*  HCT 34.9* 40.7 27.1*  PLT 95* 53* 83*   BMET Recent Labs    01/01/19 0550 01/01/19 1638 01/02/19 0622  NA 131* 134* 135  K 4.3 3.8 3.8  CL 102 103 103  CO2 23 24 23   GLUCOSE 125* 126* 118*  BUN 7 8 8   CREATININE 0.69 0.59* 0.61  CALCIUM 6.9* 7.1* 7.4*   LFT Recent Labs    01/02/19 0622  PROT 4.8*  ALBUMIN 2.0*  AST 53*  ALT 41  ALKPHOS 57  BILITOT 1.8*  BILIDIR 1.3*  IBILI 0.5   PT/INR No results for input(s): LABPROT, INR in the last 72 hours. Hepatitis Panel Recent Labs    01/01/19 0550  HEPBSAG Negative  HCVAB <0.1  HEPAIGM Negative   HEPBIGM Negative    Dg Abd Portable 2v  Result Date: 01/02/2019 CLINICAL DATA:  Ileus. EXAM: PORTABLE ABDOMEN - 2 VIEW COMPARISON:  01/01/2019.  12/31/2018. FINDINGS: NG tube noted with tip over the stomach in stable position. Soft tissue structures are unremarkable. Severe distention of small bowel loops again noted. Relative paucity of colonic gas noted. Stool in the colon. Findings again worrisome for small bowel obstruction. No significant interim change noted from prior exam. IMPRESSION: NG tube noted with tip over the stomach in stable position. Severe distention of small bowel loops worrisome for small bowel obstruction again noted. No significant interim change from prior exam. Electronically Signed   By: Maisie Fus  Register   On: 01/02/2019 06:05   Dg Abd Portable 2v  Result Date: 01/01/2019 CLINICAL DATA:  Follow-up ileus. EXAM: PORTABLE ABDOMEN - 2 VIEW COMPARISON:  Abdominal x-rays from yesterday. FINDINGS: Unchanged enteric tube. Interval decrease in the number, but not the caliber of small bowel loops, which remain severely dilated centrally. A small amount of stool is seen in the right and left colon and rectum. No pneumoperitoneum. No acute osseous abnormality. IMPRESSION: 1. Persistent severe small bowel dilatation, although the number of dilated small bowel loops has decreased when compared to prior study. Electronically Signed   By: Obie Dredge M.D.   On: 01/01/2019 14:55   Dg Abd Portable 2v  Result Date:  12/31/2018 CLINICAL DATA:  Distended abdomen. Ileus versus small bowel obstruction. EXAM: PORTABLE ABDOMEN - 2 VIEW COMPARISON:  12/30/2018. FINDINGS: NG tube noted coiled stomach in stable position. Persistent severe distention of small bowel loops. Small bowel loops measure up to 6 cm in diameter. There is relative paucity of intra colonic gas. Small amount of stool in the right colon. Findings again suggest small bowel obstruction. No free air. IMPRESSION: 1.  NG tube in stable  position. 2. Persistent severe distention of small bowel loops with relative paucity of intra colonic gas. Findings worrisome for small bowel obstruction. Electronically Signed   By: Maisie Fus  Register   On: 12/31/2018 10:29    Assessment / Plan:  1. 28 y.o. male with EtOH pancreatitis+ hypertriglyceridema. Insulin gtt dc'd with TGs 5,000 down to 375. Lipase 142. -IVF per hospitalist -monitor electrolytes closely  -will need  Endocrinology consult as out patient   2.Small bowelIleussecondary to pancreatitis. Daily abd 2 xrays continue to show severe distension of small bowel loops. Patient passed BM last night. Abdominal distension persists despite NGT to LIS, drained 1100cc last 24hrs. Patient tolerated sips of water/apple juice when NGT clamped. -continue to monitor BMs/flatulence -keep NGT in for now, ok to clamp, place to LIS if nausea develops or increased abdominal distension. -encourage ambulation in hall as tolerated   3. Malnourished: Today is 6 days without food intake, hopefully ileus will continue to improve over the next 24 hrs, if so, will make sure he tolerates clear liquids, Clear Ensure before advancing to full. TPN deferred for now.   4.Possible colitis at the hepatic flexure secondary to pancreatitis -eventual colonoscopy   5. Rectal bleeding. No current rectal bleeding. FOBT negative. -eventual colonoscopy   6. Anemia, no signs of active GI bleeding,  most likely dilutional. Hg 8.9 << 13.2. Low iron 32. ADDENDUM TO NOTE: CBC DONE AT 9:51 am. Hg down 7.1. HCT 21.9. Agree with PRBc transfusion ordered by hospitalist, Dr. Janee Morn.  -to consider EGD as in patient, colonoscopy deferred until ileus resolved, further recommendations per Dr. Russella Dar  7. Elevated LFTsand T. bilisecondary to Etoh, fatty liver and pancreatitis. Abd CT 3/14 showed fatty liver, normal gallbladder.  -follow hepatic panel -No alcohol -follow up as outpatient   8. Hypocalcemia Ca 7.4/  Hyponatremia resolved.Hypokalemia  resolved. Hypophosphotemia Phos2.8.Hypoglycemia Glu 57-125 --Calcium Gluconate, KCL, Phos, Mg replacement per hospitalist   9. Alchol abuse -CIWA protocol   Further GI recommendations per Dr. Russella Dar     LOS: 5 days   Arnaldo Natal  01/02/2019, 9:55 AM      Attending Physician Note   I have taken an interval history, reviewed the chart and examined the patient. I agree with the Advanced Practitioner's note, impression and recommendations.   Pancreatitis improving.  Ileus slightly improved and continuing NGT to LIS.  Downtrending Hgb without overt signs of bleeding, likely dilutional. Hemoccult stools. Transfusion planned by hospitalist. Consider EGD when ileus improves or if overt signs of UGI bleed or if persistently worsening anemia.  If unable to advance diet over next 1 or 2 consider TNA - defer to hospitalist.   Claudette Head, MD [facility] [phone]

## 2019-01-02 NOTE — Progress Notes (Signed)
Patient ID: Charles Dougherty, male   DOB: [birth year], 28 y.o.   MRN: [id number]       Subjective: Had a BM yesterday as well as continues to pass flatus.  Walked 3 times yesterday.  Objective: Vital signs in last 24 hours: Temp:  [98.2 F (36.8 C)-99.7 F (37.6 C)] 98.7 F (37.1 C) (03/19 0800) Pulse Rate:  [104-120] 114 (03/18 2040) Resp:  [18-30] 18 (03/19 0800) BP: (135-163)/(64-91) 155/72 (03/19 0800) SpO2:  [91 %-96 %] 91 % (03/19 0800) Weight:  [81 kg] 81 kg (03/19 0813) Last BM Date: 01/02/19  Intake/Output from previous day: 03/18 0701 - 03/19 0700 In: 3656.9 [P.O.:240; I.V.:3057; IV Piggyback:359.9] Out: 1975 [Urine:875; Emesis/NG output:1100] Intake/Output this shift: Total I/O In: -  Out: 150 [Urine:150]  PE: Abd: distended, +BS, NT, NGT with small amount of bilious output.  Documented 1000cc yesterday.  Not putting much out currently.  Lab Results:  Recent Labs    12/31/18 0525 01/01/19 0550  WBC 4.2 7.9  HGB 13.2 8.9*  HCT 40.7 27.1*  PLT 53* 83*   BMET Recent Labs    01/01/19 1638 01/02/19 0622  NA 134* 135  K 3.8 3.8  CL 103 103  CO2 24 23  GLUCOSE 126* 118*  BUN 8 8  CREATININE 0.59* 0.61  CALCIUM 7.1* 7.4*   PT/INR No results for input(s): LABPROT, INR in the last 72 hours. CMP     Component Value Date/Time   NA 135 01/02/2019 0622   K 3.8 01/02/2019 0622   CL 103 01/02/2019 0622   CO2 23 01/02/2019 0622   GLUCOSE 118 (H) 01/02/2019 0622   BUN 8 01/02/2019 0622   CREATININE 0.61 01/02/2019 0622   CALCIUM 7.4 (L) 01/02/2019 0622   PROT 4.8 (L) 01/02/2019 0622   ALBUMIN 2.0 (L) 01/02/2019 0622   AST 53 (H) 01/02/2019 0622   ALT 41 01/02/2019 0622   ALKPHOS 57 01/02/2019 0622   BILITOT 1.8 (H) 01/02/2019 0622   GFRNONAA >60 01/02/2019 0622   GFRAA >60 01/02/2019 0622   Lipase     Component Value Date/Time   LIPASE 67 (H) 01/02/2019 0622       Studies/Results: Dg Abd Portable 2v  Result Date: 01/02/2019 CLINICAL DATA:   Ileus. EXAM: PORTABLE ABDOMEN - 2 VIEW COMPARISON:  01/01/2019.  12/31/2018. FINDINGS: NG tube noted with tip over the stomach in stable position. Soft tissue structures are unremarkable. Severe distention of small bowel loops again noted. Relative paucity of colonic gas noted. Stool in the colon. Findings again worrisome for small bowel obstruction. No significant interim change noted from prior exam. IMPRESSION: NG tube noted with tip over the stomach in stable position. Severe distention of small bowel loops worrisome for small bowel obstruction again noted. No significant interim change from prior exam. Electronically Signed   By: Maisie Fus  Register   On: 01/02/2019 06:05   Dg Abd Portable 2v  Result Date: 01/01/2019 CLINICAL DATA:  Follow-up ileus. EXAM: PORTABLE ABDOMEN - 2 VIEW COMPARISON:  Abdominal x-rays from yesterday. FINDINGS: Unchanged enteric tube. Interval decrease in the number, but not the caliber of small bowel loops, which remain severely dilated centrally. A small amount of stool is seen in the right and left colon and rectum. No pneumoperitoneum. No acute osseous abnormality. IMPRESSION: 1. Persistent severe small bowel dilatation, although the number of dilated small bowel loops has decreased when compared to prior study. Electronically Signed   By: Obie Dredge M.D.   On: 01/01/2019  14:55   Dg Abd Portable 2v  Result Date: 12/31/2018 CLINICAL DATA:  Distended abdomen. Ileus versus small bowel obstruction. EXAM: PORTABLE ABDOMEN - 2 VIEW COMPARISON:  12/30/2018. FINDINGS: NG tube noted coiled stomach in stable position. Persistent severe distention of small bowel loops. Small bowel loops measure up to 6 cm in diameter. There is relative paucity of intra colonic gas. Small amount of stool in the right colon. Findings again suggest small bowel obstruction. No free air. IMPRESSION: 1.  NG tube in stable position. 2. Persistent severe distention of small bowel loops with relative paucity  of intra colonic gas. Findings worrisome for small bowel obstruction. Electronically Signed   By: Maisie Fus  Register   On: 12/31/2018 10:29    Anti-infectives: Anti-infectives (From admission, onward)   None       Assessment/Plan Pancreatitis -triglycerides were over 5,000 on admission -treatment per GI and medicine service  Ileus secondary to pancreatitis -cont NGT.  Colon seems to be working well, small bowel is delayed. -mobilize and pulm toilet  -K 3.8, would recommend K replacement to above 4.  FEN -IVFs/NGT VTE -Chemical prophylaxis per primary ID -none currently   LOS: 5 days    Letha Cape , [facility] 01/02/2019, 8:33 AM Pager: [phone]

## 2019-01-03 ENCOUNTER — Inpatient Hospital Stay (HOSPITAL_COMMUNITY): Payer: Federal, State, Local not specified - PPO

## 2019-01-03 LAB — COMPREHENSIVE METABOLIC PANEL
ALT: 40 U/L (ref 0–44)
AST: 50 U/L — ABNORMAL HIGH (ref 15–41)
Albumin: 2.1 g/dL — ABNORMAL LOW (ref 3.5–5.0)
Alkaline Phosphatase: 71 U/L (ref 38–126)
Anion gap: 8 (ref 5–15)
BUN: 8 mg/dL (ref 6–20)
CO2: 23 mmol/L (ref 22–32)
Calcium: 7.8 mg/dL — ABNORMAL LOW (ref 8.9–10.3)
Chloride: 104 mmol/L (ref 98–111)
Creatinine, Ser: 0.63 mg/dL (ref 0.61–1.24)
GFR calc non Af Amer: >60 mL/min (ref >60)
Glucose, Bld: 106 mg/dL — ABNORMAL HIGH (ref 70–99)
Potassium: 3.9 mmol/L (ref 3.5–5.1)
Sodium: 135 mmol/L (ref 135–145)
Total Bilirubin: 1.9 mg/dL — ABNORMAL HIGH (ref 0.3–1.2)
Total Protein: 5.1 g/dL — ABNORMAL LOW (ref 6.5–8.1)

## 2019-01-03 LAB — BPAM RBC
Blood Product Expiration Date: 202004102359
ISSUE DATE / TIME: 202003191352
Unit Type and Rh: 6200

## 2019-01-03 LAB — GLUCOSE, CAPILLARY
Glucose-Capillary: 91 mg/dL (ref 70–99)
Glucose-Capillary: 111 mg/dL — ABNORMAL HIGH (ref 70–99)
Glucose-Capillary: 92 mg/dL (ref 70–99)
Glucose-Capillary: 99 mg/dL (ref 70–99)
Glucose-Capillary: 89 mg/dL (ref 70–99)

## 2019-01-03 LAB — TYPE AND SCREEN
ABO/RH(D): A POS
ANTIBODY SCREEN: NEGATIVE
Unit division: 0

## 2019-01-03 LAB — CBC
HCT: 24.9 % — ABNORMAL LOW (ref 39.0–52.0)
Hemoglobin: 8.3 g/dL — ABNORMAL LOW (ref 13.0–17.0)
MCH: 32.7 pg (ref 26.0–34.0)
MCHC: 33.3 g/dL (ref 30.0–36.0)
MCV: 98 fL (ref 80.0–100.0)
Platelets: 134 10*3/uL — ABNORMAL LOW (ref 150–400)
RBC: 2.54 MIL/uL — ABNORMAL LOW (ref 4.22–5.81)
RDW: 15.9 % — ABNORMAL HIGH (ref 11.5–15.5)
WBC: 12.5 10*3/uL — ABNORMAL HIGH (ref 4.0–10.5)
nRBC: 0.2 % (ref 0.0–0.2)

## 2019-01-03 LAB — PHOSPHORUS: PHOSPHORUS: 3.8 mg/dL (ref 2.5–4.6)

## 2019-01-03 LAB — OCCULT BLOOD X 1 CARD TO LAB, STOOL: Fecal Occult Bld: NEGATIVE

## 2019-01-03 LAB — MAGNESIUM: Magnesium: 2.2 mg/dL (ref 1.7–2.4)

## 2019-01-03 MED ORDER — MORPHINE SULFATE (PF) 2 MG/ML IV SOLN
1.0000 mg | INTRAVENOUS | Status: DC | PRN
  Administered 2019-01-03 – 2019-01-10 (×34): 1 mg via INTRAVENOUS
  Filled 2019-01-03 (×35): qty 1

## 2019-01-03 MED ORDER — FUROSEMIDE 10 MG/ML IJ SOLN
40.0000 mg | Freq: Once | INTRAMUSCULAR | Status: AC
  Administered 2019-01-03: 40 mg via INTRAVENOUS
  Filled 2019-01-03: qty 4

## 2019-01-03 MED ORDER — POTASSIUM CHLORIDE 10 MEQ/100ML IV SOLN
10.0000 meq | INTRAVENOUS | Status: AC
  Administered 2019-01-03 (×4): 10 meq via INTRAVENOUS
  Filled 2019-01-03: qty 100

## 2019-01-03 MED ORDER — KETOROLAC TROMETHAMINE 30 MG/ML IJ SOLN
30.0000 mg | Freq: Once | INTRAMUSCULAR | Status: AC
  Administered 2019-01-03: 30 mg via INTRAVENOUS
  Filled 2019-01-03: qty 1

## 2019-01-03 NOTE — Progress Notes (Signed)
    CC: Abdominal pain  Subjective: Patient still distended he was on some clamping trials yesterday with some clears.  He still appears fairly distended this a.m.  He is going down for a film now.  Objective: Vital signs in last 24 hours: Temp:  [97.7 F (36.5 C)-99.5 F (37.5 C)] 98.5 F (36.9 C) (03/20 0523) Pulse Rate:  [103-120] 107 (03/20 0523) Resp:  [15-30] 20 (03/20 0523) BP: (136-156)/(72-92) 143/87 (03/20 0523) SpO2:  [88 %-95 %] 94 % (03/20 0523) Weight:  [81 kg] 81 kg (03/19 0813) Last BM Date: 01/02/19 200 PO 2000 IV 700 urine 930 NG (130 last shift recorded) No Bm recorded Afebrile, BP mildly elevated/mild tachycardia LFT's stable WBC 12.5 Transfused 1 unit PRBC 01/02/19  >> H/H: 7.1/21.9>>9.2/27.7>>8.3/24.9 Negative hepatitis screen ABD [place] 3/13:  No gallstones, no GB wall thickening, CBD 47mm, Hepatic steatosis, no bilirary obstruction CT scan 3/13: Infiltration and edema around the head and body of the pancreas consistent with acute edematous interstitial pancreatitis. No loculated fluid collections. No evidence of pancreatic necrosis. 2. Diffuse fatty infiltration of the liver. 3. Bladder wall thickening may indicate cystitis.  Intake/Output from previous day: 03/19 0701 - 03/20 0700 In: 2273.6 [P.O.:200; I.V.:1449.8; Blood:315; IV Piggyback:308.8] Out: 1630 [Urine:700; Emesis/NG output:930] Intake/Output this shift: Total I/O In: 861.9 [P.O.:200; I.V.:661.9] Out: 430 [Urine:300; Emesis/NG output:130]  General appearance: alert, cooperative and no distress Resp: clear to auscultation bilaterally GI: Still very distended bowel sounds hypoactive.  Not overly tender on palpation.  Lab Results:  Recent Labs    01/02/19 0951 01/02/19 1928 01/03/19 0320  WBC 10.0  --  12.5*  HGB 7.1* 9.2* 8.3*  HCT 21.9* 27.7* 24.9*  PLT 109*  --  134*    BMET Recent Labs    01/02/19 0622 01/03/19 0320  NA 135 135  K 3.8 3.9  CL 103 104  CO2 23 23   GLUCOSE 118* 106*  BUN 8 8  CREATININE 0.61 0.63  CALCIUM 7.4* 7.8*   PT/INR No results for input(s): LABPROT, INR in the last 72 hours.  Recent Labs  Lab 12/29/18 1116 12/30/18 0504 01/01/19 0550 01/02/19 0622 01/03/19 0320  AST 87* 86* 72* 53* 50*  ALT 61* 44 47* 41 40  ALKPHOS 63 46 53 57 71  BILITOT 1.3* 2.5* 2.8* 1.8* 1.9*  PROT 4.9* 4.3* 4.8* 4.8* 5.1*  ALBUMIN 2.1* 1.9* 2.1* 2.0* 2.1*     Lipase     Component Value Date/Time   LIPASE 67 (H) 01/02/2019 0622     Medications: . Chlorhexidine Gluconate Cloth  6 each Topical Daily  . nicotine  14 mg Transdermal Daily  . pantoprazole (PROTONIX) IV  40 mg Intravenous Q12H    Assessment/Plan  Pancreatitis -triglycerides were over 5,000 on admission -treatment per GI and medicine service  Ileus secondary to pancreatitis -cont NGT.  Colon seems to be working well, small bowel is delayed. -mobilize and pulm toilet  -K3.8, would recommend K replacement to above 4. - morphine 21 mg yesterday  Anemia - transfused 01/02/19  FEN -IVFs/NGT/ice chips VTE -Chemical prophylaxis per primary- Lovenox held /SCD's ID -none currently  Plan: From a surgical standpoint there is nothing to add.  I would recommend decreasing his narcotics.  Will defer to Medicine and GI, please call if we can be of further assistance.    LOS: 6 days    Shawntay Prest 01/03/2019 [phone]

## 2019-01-03 NOTE — Progress Notes (Signed)
PROGRESS NOTE    Charles Dougherty  WJX:[id number] DOB: 07/15/1991 DOA: 12/27/2018 PCP: Patient, No Pcp Per    Brief Narrative:  Patient is a 28 year old male with history of alcohol abuse, substance abuse, smoker who presents to the emergencydepartment with complaints of abdominal pain. Admitted for the management of acute pancreatitis related to alcohol. Incidentally found to have severe hypertriglyceridemia.He has positive family history.  Hospital course remarkable for abdominal discomfort, distention .Abdominal x-ray suggested early bowel obstruction.  General surgery consulted and he was started on conservative management with NG tube decompression, n.p.o. status. Unclear if the pancreatitis was precipitated by alcohol or hypertriglyceridemia.  Due to severe hypertriglyceridemia ,we started on insulin drip and moved to stepdown.  GI also consulted.   Assessment & Plan:   Principal Problem:   Acute alcoholic pancreatitis Active Problems:   Hypertriglyceridemia   Pancreatitis   ETOH abuse   Drug abuse (HCC)   Cystitis   Ileus (HCC)   Abdominal pain   SBO (small bowel obstruction) (HCC)   Hypophosphatemia   Hypomagnesemia   Hypocalcemia   AKI (acute kidney injury) (HCC)   Anemia  1 acute alcoholic pancreatitis versus acute pancreatitis precipitated by hypertriglyceridemia Patient had presented with abdominal pain nausea and vomiting noted to have significantly elevated lipase levels and transaminitis.  Patient noted to be every day drinker usually drinks beer.  Patient placed on conservative management with IV fluids, n.p.o. and pain management.  CT abdomen and pelvis which was done did show infiltration and edema around the head and body of the pancreas consistent with acute edematous interstitial pancreatitis.  No loculated fluid collections, no evidence of pancreatic necrosis.  Patient currently with NG tube in place states some improvement with abdominal pain.  Blood pressure  slightly elevated likely secondary to current pain.  IV fluids have been decreased to 50 cc/h.  Continue supportive care pain management.  NG tube was to be discontinued 01/01/2019, and was clamped however patient unable to tolerate and as such NG tube was placed back to suction.  NG tube with output of 950 cc over the past 24 hours.  Patient will clamping trial today per GI.  IV fluids at 50 cc/h.  Supportive care.  Mobilization.  Patient with trial of clears today. GI and general surgery following and appreciate input and recommendations.   2.  Severe hypertriglyceridemia Patient with family history of hypertriglyceridemia.  Patient's uncle noted to have this.  Patient noted to have triglycerides levels greater than 5000 with repeat labs confirming it.  Patient was placed on the insulin drip with significant improvement with elevated triglyceride levels.  Triglycerides yesterday morning at 375.  Insulin drip has been discontinued as patient was starting to have hypoglycemia and triglyceride levels less than 500.  Will need to be on the fenofibrate on discharge.  Will need outpatient follow-up with PCP and probable referral to endocrinology.  Alcohol cessation has been stressed to patient.  3.  Small bowel obstruction versus ileus secondary to pancreatitis Patient with930cc output from NG tube over the past 24 hours.  Abdominal films with no significant change with dilated loops of bowel.  Patient passing flatus.  Patient passing flatus but no bowel movement.  Had a bowel movement 2 days ago.  Still with some epigastric abdominal discomfort.  Abdominal films with no significant improvement.  Clamping trial per GI today and patient on clear liquids.  Ambulation.  General surgery and GI following.  Keep potassium greater than 4.  Keep magnesium greater  than 2.  Supportive care.   4.  Hypophosphatemia/hypokalemia Repleted.  Phosphorus level at 3.8.   5.  Anemia Likely dilutional.  Patient with no overt  bleeding.  Anemia panel consistent with anemia of chronic disease/iron deficiency anemia.  Iron level at 32.  Ferritin elevated at 1383.  Hemoglobin currently at 8.3 from 7.1 from 8.9 from 13.2.  FOBT negative.  Status post 1 unit packed red blood cells 01/02/2019.  Continue PPI.  May need a dose of IV Feraheme.  ??  Upper endoscopy.  GI following.   6.  Hypocalcemia/hypoalbuminemia Hypocalcemia associated likely with pancreatitis.  Patient also noted to have a severe hypoalbuminemia of 1.9.  Corrected calcium at 9.32.  Patient given some doses of IV calcium gluconate.  Follow.   7.  Transaminitis Images done did not show any gallbladder stones or inflammation.  Patient noted to have diffuse fatty liver.  LFTs trending down.  Follow.    8.  Thrombocytopenia Likely secondary to alcoholic liver disease.  Lovenox discontinued.  No overt bleeding.  Platelet count improving currently at 134.  Follow.  9.  Suspected cystitis Per CT imaging.  Patient currently asymptomatic.  Urinalysis unremarkable.  No need for antibiotics at this time.  10.  Acute kidney injury Resolved with hydration.  Follow.   11.  Hypoglycemic events Felt secondary to insulin drip.  Patient also noted to be n.p.o.  Insulin drip has been discontinued.  CBG 91 this morning.  Continue D5 normal saline.  Follow.   12 alcohol abuse No signs of withdrawal.  Alcohol cessation stressed to patient.  Social work consulted.  Follow.   DVT prophylaxis: SCDs Code Status: Full Family Communication: Updated patient.  No family at bedside. Disposition Plan: Likely home once ileus/small bowel obstruction has resolved and tolerating oral intake when okay with GI.    Consultants:   General surgery: Dr.Tsuei 12/29/2018  Gastroenterology: Dr. Barron Alvine 12/30/2018  Procedures:   CT abdomen and pelvis 12/28/2018  Right upper quadrant ultrasound 12/27/2018  Abdominal films 12/29/2018, 12/30/2018, 12/31/2018  Transfused 1 unit packed red  blood cells 01/02/2019  Antimicrobials:   None   Subjective: Patient in bed.  Denies any nausea or emesis.  Positive flatus.  No bowel movement.  Some abdominal distention.  Tolerating small amounts of clear liquids.  Had some ginger ale which caused some upper abdominal discomfort.   Objective: Vitals:   01/03/19 0100 01/03/19 0143 01/03/19 0209 01/03/19 0523  BP: (!) 142/91 (!) 150/88 139/88 (!) 143/87  Pulse: (!) 120 (!) 111 (!) 108 (!) 107  Resp: 16 (!) Temp: 98.6 F (37 C)  99 F (37.2 C) 98.5 F (36.9 C)  TempSrc: Oral  Oral Oral  SpO2: 91% 93% 95% 94%  Weight:    80.9 kg  Height:        Intake/Output Summary (Last 24 hours) at 01/03/2019 1138 Last data filed at 01/03/2019 0639 Gross per 24 hour  Intake 1593.14 ml  Output 1730 ml  Net -136.86 ml   Filed Weights   12/30/18 1100 01/02/19 0813 01/03/19 0523  Weight: 78 kg 81 kg 80.9 kg    Examination:  General exam: NG tube in place.  Respiratory system: CTAB.  No wheezes, no crackles, no rhonchi.  Normal respiratory effort. Cardiovascular system: RRR no murmurs rubs or gallops.  No JVD.  No lower extremity edema.  Gastrointestinal system: Abdomen is distended, hypoactive bowel sounds, decreased tenderness to palpation, no rebound, no guarding.  Central nervous system: Alert and oriented. No focal neurological deficits. Extremities: Symmetric 5 x 5 power. Skin: No rashes, lesions or ulcers Psychiatry: Judgement and insight appear normal. Mood & affect appropriate.     Data Reviewed: I have personally reviewed following labs and imaging studies  CBC: Recent Labs  Lab 12/29/18 1116 12/30/18 1534 12/31/18 0525 01/01/19 0550 01/02/19 0951 01/02/19 1928 01/03/19 0320  WBC 7.6 8.6 4.2 7.9 10.0  --  12.5*  NEUTROABS 6.3 6.5 2.9 6.0 8.1*  --   --   HGB 16.6 11.9* 13.2 8.9* 7.1* 9.2* 8.3*  HCT 45.0 34.9* 40.7 27.1* 21.9* 27.7* 24.9*  MCV 102.0* 102.0* 102.8* 101.1* 102.3*  --  98.0  PLT 84* 95*  53* 83* 109*  --  134*   Basic Metabolic Panel: Recent Labs  Lab 12/30/18 1532 12/31/18 0525 12/31/18 1603 01/01/19 0550 01/01/19 1638 01/02/19 0622 01/03/19 0320  NA 132* 131* 130* 131* 134* 135 135  K 5.4* 3.0* 3.6 4.3 3.8 3.8 3.9  CL 109 106 101 102 103 103 104  CO2 15* 17* 21* 23 24 23 23   GLUCOSE 52* 148* 61* 125* 126* 118* 106*  BUN 20 12 8 7 8 8 8   CREATININE 1.12 0.88 0.63 0.69 0.59* 0.61 0.63  CALCIUM 5.4* 5.5* 6.6* 6.9* 7.1* 7.4* 7.8*  MG 2.5* 2.0 2.1 2.0  --   --  2.2  PHOS 1.5* 1.1* 2.0* 2.4*  --  2.8 3.8   GFR: Estimated Creatinine Clearance: 145.7 mL/min (by C-G formula based on SCr of 0.63 mg/dL). Liver Function Tests: Recent Labs  Lab 12/29/18 1116 12/30/18 0504 01/01/19 0550 01/02/19 0622 01/03/19 0320  AST 87* 86* 72* 53* 50*  ALT 61* 44 47* 41 40  ALKPHOS 63 46 53 57 71  BILITOT 1.3* 2.5* 2.8* 1.8* 1.9*  PROT 4.9* 4.3* 4.8* 4.8* 5.1*  ALBUMIN 2.1* 1.9* 2.1* 2.0* 2.1*   Recent Labs  Lab 12/27/18 2203 12/28/18 0524 12/30/18 1532 01/02/19 0622  LIPASE 209* 840* 142* 67*   No results for input(s): AMMONIA in the last 168 hours. Coagulation Profile: Recent Labs  Lab 12/28/18 0524  INR 0.9   Cardiac Enzymes: No results for input(s): CKTOTAL, CKMB, CKMBINDEX, TROPONINI in the last 168 hours. BNP (last 3 results) No results for input(s): PROBNP in the last 8760 hours. HbA1C: No results for input(s): HGBA1C in the last 72 hours. CBG: Recent Labs  Lab 01/02/19 1527 01/02/19 1943 01/02/19 2313 01/03/19 0520 01/03/19 0739  GLUCAP 100* 78 102* 91 111*   Lipid Profile: Recent Labs    01/01/19 0550 01/02/19 0622  TRIG 399* 375*   Thyroid Function Tests: No results for input(s): TSH, T4TOTAL, FREET4, T3FREE, THYROIDAB in the last 72 hours. Anemia Panel: No results for input(s): VITAMINB12, FOLATE, FERRITIN, TIBC, IRON, RETICCTPCT in the last 72 hours. Sepsis Labs: Recent Labs  Lab 12/30/18 0843  LATICACIDVEN 1.5    Recent  Results (from the past 240 hour(s))  Urine culture     Status: None   Collection Time: 12/28/18  5:04 AM  Result Value Ref Range Status   Specimen Description   Final    URINE, RANDOM Performed at [facility], [address], [place], [place] [address]    Special Requests   Final    NONE Performed at [facility], [address], [place], [place] [address]    Culture   Final    NO GROWTH Performed at [facility],  [address] [place], [place] [address]    Report Status 12/29/2018 FINAL  Final  MRSA PCR Screening     Status: None   Collection Time: 12/30/18 11:24 AM  Result Value Ref Range Status   MRSA by PCR NEGATIVE NEGATIVE Final    Comment:        The GeneXpert MRSA Assay (FDA approved for NASAL specimens only), is one component of a comprehensive MRSA colonization surveillance program. It is not intended to diagnose MRSA infection nor to guide or monitor treatment for MRSA infections. Performed at [facility], [address] [place], [place] [address]          Radiology Studies: Dg Abd 2 Views  Result Date: 01/03/2019 CLINICAL DATA:  Abdominal distension EXAM: ABDOMEN - 2 VIEW COMPARISON:  January 02, 2019 FINDINGS: Supine and upright images obtained. Nasogastric tube tip and side port are in the stomach. There remains generalized small bowel dilatation with scattered air-fluid levels in a pattern felt to be indicative bowel obstruction. A small amount of air is noted in the distal sigmoid and rectum. No free air. Small left pleural effusion with left base atelectasis noted. IMPRESSION: Persistent bowel dilatation with air fluid level suggesting persistent obstruction. No free air. Nasogastric tube tip and side port in stomach. Small left pleural effusion with left base atelectasis. Electronically Signed   By: Bretta Bang III M.D.   On: 01/03/2019 08:21   Dg Abd Portable 2v  Result Date:  01/02/2019 CLINICAL DATA:  Ileus. EXAM: PORTABLE ABDOMEN - 2 VIEW COMPARISON:  01/01/2019.  12/31/2018. FINDINGS: NG tube noted with tip over the stomach in stable position. Soft tissue structures are unremarkable. Severe distention of small bowel loops again noted. Relative paucity of colonic gas noted. Stool in the colon. Findings again worrisome for small bowel obstruction. No significant interim change noted from prior exam. IMPRESSION: NG tube noted with tip over the stomach in stable position. Severe distention of small bowel loops worrisome for small bowel obstruction again noted. No significant interim change from prior exam. Electronically Signed   By: Maisie Fus  Register   On: 01/02/2019 06:05   Dg Abd Portable 2v  Result Date: 01/01/2019 CLINICAL DATA:  Follow-up ileus. EXAM: PORTABLE ABDOMEN - 2 VIEW COMPARISON:  Abdominal x-rays from yesterday. FINDINGS: Unchanged enteric tube. Interval decrease in the number, but not the caliber of small bowel loops, which remain severely dilated centrally. A small amount of stool is seen in the right and left colon and rectum. No pneumoperitoneum. No acute osseous abnormality. IMPRESSION: 1. Persistent severe small bowel dilatation, although the number of dilated small bowel loops has decreased when compared to prior study. Electronically Signed   By: Obie Dredge M.D.   On: 01/01/2019 14:55        Scheduled Meds:  Chlorhexidine Gluconate Cloth  6 each Topical Daily   nicotine  14 mg Transdermal Daily   pantoprazole (PROTONIX) IV  40 mg Intravenous Q12H   Continuous Infusions:  sodium chloride Stopped (01/02/19 1042)   dextrose 5 % and 0.9% NaCl 50 mL/hr at 01/03/19 0100   potassium chloride       LOS: 6 days    Time spent: 40 mins    Ramiro Harvest, MD [facility]  If 7PM-7AM, please contact night-coverage [email or web address] 01/03/2019, 11:38 AM

## 2019-01-03 NOTE — Progress Notes (Addendum)
[facility] Progress Note  CC:   Etoh + hypertriglyceridemia pancreatitis   Subjective: Passing more gas per the rectum. No further BMs. Less abdominal distension. Tolerating small amounts of clear liquids. Ginger ale caused some upper abdominal discomfort.   Objective:  Vital signs in last 24 hours: Temp:  [97.7 F (36.5 C)-99.5 F (37.5 C)] 98.5 F (36.9 C) (03/20 0523) Pulse Rate:  [103-120] 107 (03/20 0523) Resp:  [15-30] 20 (03/20 0523) BP: (136-156)/(73-92) 143/87 (03/20 0523) SpO2:  [88 %-95 %] 94 % (03/20 0523) Weight:  [80.9 kg] 80.9 kg (03/20 0523) Last BM Date: 01/02/19 General:   Alert,  Well-developed,    in NAD Heart:  Regular rate and rhythm; no murmurs Pulm: clear  Abdomen: less distended, softer than prior exam, mild generalized tenderness without rebound or guarding, hypoactive BS x 4 quads, NGT to LIS draining brown bilious drainage  Extremities:  UEs with edema Neurologic:  Alert and  oriented x4;  grossly normal neurologically. Psych:  Alert and cooperative. Normal mood and affect.  Intake/Output from previous day: 03/19 0701 - 03/20 0700 In: 2273.6 [P.O.:200; I.V.:1449.8; Blood:315; IV Piggyback:308.8] Out: 1880 [Urine:950; Emesis/NG output:930] Intake/Output this shift: No intake/output data recorded.  Lab Results: Recent Labs    01/01/19 0550 01/02/19 0951 01/02/19 1928 01/03/19 0320  WBC 7.9 10.0  --  12.5*  HGB 8.9* 7.1* 9.2* 8.3*  HCT 27.1* 21.9* 27.7* 24.9*  PLT 83* 109*  --  134*   BMET Recent Labs    01/01/19 1638 01/02/19 0622 01/03/19 0320  NA 134* 135 135  K 3.8 3.8 3.9  CL 103 103 104  CO2 24 23 23   GLUCOSE 126* 118* 106*  BUN 8 8 8   CREATININE 0.59* 0.61 0.63  CALCIUM 7.1* 7.4* 7.8*   LFT Recent Labs    01/02/19 0622 01/03/19 0320  PROT 4.8* 5.1*  ALBUMIN 2.0* 2.1*  AST 53* 50*  ALT 41 40  ALKPHOS 57 71  BILITOT 1.8* 1.9*  BILIDIR 1.3*  --   IBILI 0.5  --    PT/INR No results for  input(s): LABPROT, INR in the last 72 hours. Hepatitis Panel Recent Labs    01/01/19 0550  HEPBSAG Negative  HCVAB <0.1  HEPAIGM Negative  HEPBIGM Negative    Dg Abd 2 Views  Result Date: 01/03/2019 CLINICAL DATA:  Abdominal distension EXAM: ABDOMEN - 2 VIEW COMPARISON:  January 02, 2019 FINDINGS: Supine and upright images obtained. Nasogastric tube tip and side port are in the stomach. There remains generalized small bowel dilatation with scattered air-fluid levels in a pattern felt to be indicative bowel obstruction. A small amount of air is noted in the distal sigmoid and rectum. No free air. Small left pleural effusion with left base atelectasis noted. IMPRESSION: Persistent bowel dilatation with air fluid level suggesting persistent obstruction. No free air. Nasogastric tube tip and side port in stomach. Small left pleural effusion with left base atelectasis. Electronically Signed   By: Bretta Bang III M.D.   On: 01/03/2019 08:21   Dg Abd Portable 2v  Result Date: 01/02/2019 CLINICAL DATA:  Ileus. EXAM: PORTABLE ABDOMEN - 2 VIEW COMPARISON:  01/01/2019.  12/31/2018. FINDINGS: NG tube noted with tip over the stomach in stable position. Soft tissue structures are unremarkable. Severe distention of small bowel loops again noted. Relative paucity of colonic gas noted. Stool in the colon. Findings again worrisome for small bowel obstruction. No significant interim change noted from prior exam. IMPRESSION: NG  tube noted with tip over the stomach in stable position. Severe distention of small bowel loops worrisome for small bowel obstruction again noted. No significant interim change from prior exam. Electronically Signed   By: Maisie Fus  Register   On: 01/02/2019 06:05   Dg Abd Portable 2v  Result Date: 01/01/2019 CLINICAL DATA:  Follow-up ileus. EXAM: PORTABLE ABDOMEN - 2 VIEW COMPARISON:  Abdominal x-rays from yesterday. FINDINGS: Unchanged enteric tube. Interval decrease in the number, but  not the caliber of small bowel loops, which remain severely dilated centrally. A small amount of stool is seen in the right and left colon and rectum. No pneumoperitoneum. No acute osseous abnormality. IMPRESSION: 1. Persistent severe small bowel dilatation, although the number of dilated small bowel loops has decreased when compared to prior study. Electronically Signed   By: Obie Dredge M.D.   On: 01/01/2019 14:55    Assessment / Plan:  1. 28 y.o. male with EtOH pancreatitis+ hypertriglyceridema. Insulin gtt dc'd with TGs 5,000 down to 375. Lipase 142.  -IVF per hospitalist -monitor electrolytes closely  -will need Endocrinology consult as out patient   2.Small bowelIleussecondary to pancreatitis. Abd xray continues to show severe distension of small bowel loops. Patient passed BM 2 days ago. Continues to pass more gas per the rectum.  Abdominal distension decreasing. -continue to monitor BMs/flatulence -Non carbonated clear liquids when NGT clamped -keep NGT in for now, ok to clamp, place to LIS if nausea develops or increased abdominal distension. -encourage ambulation in hall as tolerated   3. Malnourished: Today is 7 days without food intake, hopefully ileus will continue to improve over the next 24 hrs, if so, will make sure he tolerates clear liquids, Clear Ensure before advancing to full. TPN deferred for now.   4.Possible colitis at the hepatic flexure secondary to pancreatitis -eventual colonoscopy   5. Rectal bleeding.No current rectal bleeding. FOBT negative. -eventual colonoscopy  6. Anemia, no signs of active GI bleeding, most likely dilutional. Hg  7.1 << 8.9 <<13.2. Low iron 32. Received I unit of PRBCs 3/19. Hg up to 8.3. HCT 24.9. -to consider EGD as in patient, colonoscopy deferred until ileus resolved, further recommendations per Dr. Chales Abrahams  7. Elevated LFTsand T. bilisecondary to Etoh, fatty liver and pancreatitis. Abd CT 3/14 showed fatty liver,  normal gallbladder.  -follow hepatic panel -No alcohol -follow up as outpatient  8. Hypocalcemia Ca7.8. Hyponatremia resolved.Hypokalemiaresolved. Hypophosphotemia Phos3.8.Hypoglycemia Glu 57-125 --Calcium Gluconate, KCL, Phos, Mg replacement per hospitalist   9. Alchol abuse, no signs of withdrawal   10. Leukocytosis with WBC 12.5 today. Afebrile. Decreased abdominal pain -monitor temp -repeat cbc with diff in am       LOS: 6 days   Charles Dougherty  01/03/2019, 10:29 AM    Attending physician's note   I have taken an interval history, reviewed the chart and examined the patient. I agree with the Advanced Practitioner's note, impression and recommendations.   28yr old WM with acute pancreatitis d/t alcohol abuse/polysubstance abuse and hypertriglyceridemia.  Had small bowel ileus requiring NG tube.  Passing flatus this morning. TG down to 375. Plan: Clamp NG tube today.  Trial of clear liquid diet.  If unable to tolerate, start Reglan, resume NG tube suction.  Ambulate.  Minimize pain medications. Trend and correct electrolytes.  Monitor CBC.  No active bleeding.  EGD as an outpatient.  Serial x-ray KUBs.  D/W patient and patient's mother in detail.  Edman Circle, MD

## 2019-01-04 ENCOUNTER — Inpatient Hospital Stay (HOSPITAL_COMMUNITY): Payer: Federal, State, Local not specified - PPO

## 2019-01-04 LAB — CBC WITH DIFFERENTIAL/PLATELET
Abs Immature Granulocytes: 0.31 10*3/uL — ABNORMAL HIGH (ref 0.00–0.07)
Basophils Absolute: 0 10*3/uL (ref 0.0–0.1)
Basophils Relative: 0 %
Eosinophils Absolute: 0.1 10*3/uL (ref 0.0–0.5)
Eosinophils Relative: 1 %
HCT: 26.3 % — ABNORMAL LOW (ref 39.0–52.0)
Hemoglobin: 8.5 g/dL — ABNORMAL LOW (ref 13.0–17.0)
IMMATURE GRANULOCYTES: 2 %
Lymphocytes Relative: 6 %
Lymphs Abs: 0.8 10*3/uL (ref 0.7–4.0)
MCH: 32.9 pg (ref 26.0–34.0)
MCHC: 32.3 g/dL (ref 30.0–36.0)
MCV: 101.9 fL — ABNORMAL HIGH (ref 80.0–100.0)
Monocytes Absolute: 0.7 10*3/uL (ref 0.1–1.0)
Monocytes Relative: 5 %
Neutro Abs: 11.4 10*3/uL — ABNORMAL HIGH (ref 1.7–7.7)
Neutrophils Relative %: 86 %
Platelets: 213 10*3/uL (ref 150–400)
RBC: 2.58 MIL/uL — ABNORMAL LOW (ref 4.22–5.81)
RDW: 14.9 % (ref 11.5–15.5)
WBC: 13.3 10*3/uL — AB (ref 4.0–10.5)
nRBC: 0.2 % (ref 0.0–0.2)

## 2019-01-04 LAB — COMPREHENSIVE METABOLIC PANEL
ALT: 31 U/L (ref 0–44)
AST: 35 U/L (ref 15–41)
Albumin: 2.1 g/dL — ABNORMAL LOW (ref 3.5–5.0)
Alkaline Phosphatase: 72 U/L (ref 38–126)
Anion gap: 10 (ref 5–15)
BUN: 7 mg/dL (ref 6–20)
CO2: 23 mmol/L (ref 22–32)
Calcium: 7.9 mg/dL — ABNORMAL LOW (ref 8.9–10.3)
Chloride: 100 mmol/L (ref 98–111)
Creatinine, Ser: 0.65 mg/dL (ref 0.61–1.24)
GFR calc Af Amer: >60 mL/min (ref >60)
Glucose, Bld: 102 mg/dL — ABNORMAL HIGH (ref 70–99)
Potassium: 3.8 mmol/L (ref 3.5–5.1)
Sodium: 133 mmol/L — ABNORMAL LOW (ref 135–145)
Total Bilirubin: 1.6 mg/dL — ABNORMAL HIGH (ref 0.3–1.2)
Total Protein: 5.2 g/dL — ABNORMAL LOW (ref 6.5–8.1)

## 2019-01-04 LAB — GLUCOSE, CAPILLARY
GLUCOSE-CAPILLARY: 103 mg/dL — AB (ref 70–99)
Glucose-Capillary: 103 mg/dL — ABNORMAL HIGH (ref 70–99)
Glucose-Capillary: 88 mg/dL (ref 70–99)
Glucose-Capillary: 93 mg/dL (ref 70–99)
Glucose-Capillary: 94 mg/dL (ref 70–99)
Glucose-Capillary: 95 mg/dL (ref 70–99)
Glucose-Capillary: 98 mg/dL (ref 70–99)

## 2019-01-04 MED ORDER — SIMETHICONE 80 MG PO CHEW
160.0000 mg | CHEWABLE_TABLET | Freq: Four times a day (QID) | ORAL | Status: DC
  Administered 2019-01-04 – 2019-01-19 (×54): 160 mg via ORAL
  Filled 2019-01-04 (×55): qty 2

## 2019-01-04 MED ORDER — LEVOFLOXACIN IN D5W 500 MG/100ML IV SOLN
500.0000 mg | INTRAVENOUS | Status: DC
  Administered 2019-01-04: 500 mg via INTRAVENOUS
  Filled 2019-01-04: qty 100

## 2019-01-04 MED ORDER — KETOROLAC TROMETHAMINE 30 MG/ML IJ SOLN
30.0000 mg | Freq: Four times a day (QID) | INTRAMUSCULAR | Status: AC | PRN
  Administered 2019-01-04 – 2019-01-09 (×16): 30 mg via INTRAVENOUS
  Filled 2019-01-04 (×16): qty 1

## 2019-01-04 MED ORDER — METOCLOPRAMIDE HCL 5 MG/ML IJ SOLN
10.0000 mg | Freq: Three times a day (TID) | INTRAMUSCULAR | Status: DC
  Administered 2019-01-04 – 2019-01-13 (×26): 10 mg via INTRAVENOUS
  Filled 2019-01-04 (×27): qty 2

## 2019-01-04 MED ORDER — POTASSIUM CHLORIDE 10 MEQ/100ML IV SOLN
10.0000 meq | INTRAVENOUS | Status: AC
  Administered 2019-01-04 (×4): 10 meq via INTRAVENOUS
  Filled 2019-01-04 (×2): qty 100

## 2019-01-04 MED ORDER — MORPHINE SULFATE (PF) 2 MG/ML IV SOLN
1.0000 mg | Freq: Once | INTRAVENOUS | Status: AC
  Administered 2019-01-04: 1 mg via INTRAVENOUS
  Filled 2019-01-04: qty 1

## 2019-01-04 NOTE — Progress Notes (Signed)
IMPRESSION and PLAN:    28yr old WM with acute pancreatitisd/talcohol abuse/polysubstance abuse and hypertriglyceridemia.  Complicated by ileus.  Unable to tolerate clear liquids at the present time.  Plan: Add Reglan, ambulate, minimize pain medications, will keep NG tube in for now.  If still unable to tolerate p.o. over the next 24 hours, start TPN, trend labs.            Makar Slatter is a 28 y.o. male  Feels somewhat better today Did pass flatus and had bowel movement x 1. No fever or chills Took apple juice this morning.  Did feel bloated afterwards.  NG tube hooked back to suction.   Past Medical History:  Diagnosis Date   Allergy    Asthma    Atopic eczema    Cystitis 12/28/2018   Drug abuse (HCC) 12/28/2018   ETOH abuse 12/28/2018    Current Facility-Administered Medications  Medication Dose Route Frequency Provider Last Rate Last Dose   0.9 %  sodium chloride infusion   Intravenous PRN Rodolph Bong, MD   Stopped at 01/02/19 1042   Chlorhexidine Gluconate Cloth 2 % PADS 6 each  6 each Topical Daily Rodolph Bong, MD   6 each at 01/03/19 1000   dextrose 5 %-0.9 % sodium chloride infusion   Intravenous Continuous Rodolph Bong, MD 50 mL/hr at 01/04/19 0600     dextrose 50 % solution 25 mL  25 mL Intravenous PRN Rodolph Bong, MD   25 mL at 12/31/18 1621   ketorolac (TORADOL) 30 MG/ML injection 30 mg  30 mg Intravenous Q6H PRN Rodolph Bong, MD   30 mg at 01/04/19 1205   labetalol (NORMODYNE,TRANDATE) injection 5 mg  5 mg Intravenous Q2H PRN Rodolph Bong, MD   5 mg at 01/01/19 2356   morphine 2 MG/ML injection 1 mg  1 mg Intravenous Q4H PRN Rodolph Bong, MD   1 mg at 01/04/19 1544   nicotine (NICODERM CQ - dosed in mg/24 hours) patch 14 mg  14 mg Transdermal Daily Rodolph Bong, MD   14 mg at 01/04/19 1058   ondansetron (ZOFRAN) tablet 4 mg  4 mg Oral Q6H PRN Rodolph Bong, MD       Or    ondansetron [facility]) injection 4 mg  4 mg Intravenous Q6H PRN Rodolph Bong, MD   4 mg at 01/04/19 0354   pantoprazole (PROTONIX) injection 40 mg  40 mg Intravenous Q12H Rodolph Bong, MD   40 mg at 01/04/19 1056   phenol (CHLORASEPTIC) mouth spray 1 spray  1 spray Mouth/Throat PRN Rodolph Bong, MD   1 spray at 12/29/18 1629   simethicone (MYLICON) chewable tablet 160 mg  160 mg Oral QID Rodolph Bong, MD   160 mg at 01/04/19 1205   zolpidem (AMBIEN) tablet 5 mg  5 mg Oral QHS PRN,MR X 1 Rodolph Bong, MD   5 mg at 01/04/19 0041    History reviewed. No pertinent surgical history.  Family History  Problem Relation Age of Onset   Hypertension Mother    Hyperlipidemia Mother    Nephrolithiasis Father    GER disease Brother     Social History   Tobacco Use   Smoking status: Current Every Day Smoker    Types: Cigarettes   Smokeless tobacco: Never Used  Substance Use Topics   Alcohol use: Yes    Comment: weekly   Drug  use: Yes    Types: Marijuana    Allergies  Allergen Reactions   Shellfish-Derived Products Nausea And Vomiting    *Mussels*     Review of Systems: All systems reviewed and negative except where noted in HPI.    Physical Exam:     BP (!) 142/93 (BP Location: Right Arm)    Pulse (!) 105    Temp 98.9 F (37.2 C) (Oral)    Resp 16    Ht 5\' 10"  (1.778 m)    Wt 78.3 kg    SpO2 96%    BMI 24.77 kg/m  @WEIGHTLAST3 @ GENERAL:  Alert, oriented, cooperative, not in acute distress. PSYCH: :Pleasant, normal mood and affect. HEENT:  conjunctiva pink, mucous membranes moist, neck supple without masses. No jaundice. CARDIAC:  S1 S2 normal. No murmers. PULM: Normal respiratory effort, lungs CTA bilaterally, no wheezing. ABDOMEN: Decreased bowel sounds, nontender Rectal exam: Deferred SKIN:  turgor, no lesions seen. Musculoskeletal:  Normal muscle tone, normal strength. NEURO: Alert and oriented x 3, no focal neurologic  deficits.   Data Reviewed: I have personally reviewed following labs and imaging studies  CBC: Recent Labs  Lab 01/01/19 0550 01/02/19 0951 01/02/19 1928 01/03/19 0320 01/04/19 0518  WBC 7.9 10.0  --  12.5* 13.3*  NEUTROABS 6.0 8.1*  --   --  11.4*  HGB 8.9* 7.1* 9.2* 8.3* 8.5*  HCT 27.1* 21.9* 27.7* 24.9* 26.3*  MCV 101.1* 102.3*  --  98.0 101.9*  PLT 83* 109*  --  134* 213   Basic Metabolic Panel: Recent Labs  Lab 12/31/18 1603 01/01/19 0550  01/02/19 0622 01/03/19 0320 01/04/19 0518  NA 130* 131*   < > 135 135 133*  K 3.6 4.3   < > 3.8 3.9 3.8  CL 101 102   < > 103 104 100  CO2 21* 23   < > 23 23 23   GLUCOSE 61* 125*   < > 118* 106* 102*  BUN 8 7   < > 8 8 7   CREATININE 0.63 0.69   < > 0.61 0.63 0.65  CALCIUM 6.6* 6.9*   < > 7.4* 7.8* 7.9*  MG 2.1 2.0  --   --  2.2  --   PHOS 2.0* 2.4*  --  2.8 3.8  --    < > = values in this interval not displayed.   GFR: Estimated Creatinine Clearance: 145.7 mL/min (by C-G formula based on SCr of 0.65 mg/dL). Liver Function Tests: Recent Labs  Lab 12/30/18 0504 01/01/19 0550 01/02/19 0622 01/03/19 0320 01/04/19 0518  AST 86* 72* 53* 50* 35  ALT 44 47* 41 40 31  ALKPHOS 46 53 57 71 72  BILITOT 2.5* 2.8* 1.8* 1.9* 1.6*  PROT 4.3* 4.8* 4.8* 5.1* 5.2*  ALBUMIN 1.9* 2.1* 2.0* 2.1* 2.1*   Recent Labs  Lab 12/30/18 1532 01/02/19 0622  LIPASE 142* 67*   No results for input(s): AMMONIA in the last 168 hours. Coagulation Profile: No results for input(s): INR, PROTIME in the last 168 hours. HbA1C: No results for input(s): HGBA1C in the last 72 hours. Lipid Profile: Recent Labs    01/02/19 0622  TRIG 375*   Thyroid Function Tests: No results for input(s): TSH, T4TOTAL, FREET4, T3FREE, THYROIDAB in the last 72 hours. Anemia Panel: No results for input(s): VITAMINB12, FOLATE, FERRITIN, TIBC, IRON, RETICCTPCT in the last 72 hours.  Recent Results (from the past 240 hour(s))  Urine culture     Status: None  Collection Time: 12/28/18  5:04 AM  Result Value Ref Range Status   Specimen Description   Final    URINE, RANDOM Performed at [facility], [address] [place], [place] [address]    Special Requests   Final    NONE Performed at [facility], [address], [place], [place] [address]    Culture   Final    NO GROWTH Performed at [facility], [address], [place], [place] [address]    Report Status 12/29/2018 FINAL  Final  MRSA PCR Screening     Status: None   Collection Time: 12/30/18 11:24 AM  Result Value Ref Range Status   MRSA by PCR NEGATIVE NEGATIVE Final    Comment:        The GeneXpert MRSA Assay (FDA approved for NASAL specimens only), is one component of a comprehensive MRSA colonization surveillance program. It is not intended to diagnose MRSA infection nor to guide or monitor treatment for MRSA infections. Performed at [facility], [address], [place], [place] [address]       Radiology Studies: Dg Abd 1 View  Result Date: 12/29/2018 CLINICAL DATA:  Evaluate NG tube placement EXAM: ABDOMEN - 1 VIEW COMPARISON:  December 24, 2018 FINDINGS: The NG tube terminates in the gastric antrum. No free air, portal venous gas, or pneumatosis. Dilated loops of small bowel are identified with a paucity of colonic gas. IMPRESSION: 1. The NG tube is in good position. 2. Dilated small bowel loops with a paucity of colonic gas are most consistent with a bowel obstruction. Electronically Signed   By: Gerome Sam III M.D   On: 12/29/2018 14:11   Dg Abd 1 View  Result Date: 12/29/2018 CLINICAL DATA:  Patient with generalized abdominal pain. EXAM: ABDOMEN - 1 VIEW COMPARISON:  CT abdomen pelvis 12/27/2018 FINDINGS: Multiple gaseous distended loops of small bowel are demonstrated within the abdomen measuring up to 5 cm. Small amount of colonic gas is present. Supine evaluation limited for the detection of free  intraperitoneal air. IMPRESSION: Gaseous distended loops of small bowel throughout the abdomen with small amount of distal colonic gas. Findings may represent early/partial obstruction or ileus. Electronically Signed   By: Annia Belt M.D.   On: 12/29/2018 10:35   Ct Abdomen Pelvis W Contrast  Result Date: 12/28/2018 CLINICAL DATA:  Severe mid abdominal pain and the umbilicus. Tenderness. Nausea, vomiting, and diarrhea. Symptoms for 3 days. EXAM: CT ABDOMEN AND PELVIS WITH CONTRAST TECHNIQUE: Multidetector CT imaging of the abdomen and pelvis was performed using the standard protocol following bolus administration of intravenous contrast. CONTRAST:  OMNIPAQUE IOHEXOL 300 MG/ML  SOLN COMPARISON:  Ultrasound right upper quadrant 12/27/2018 FINDINGS: Lower chest: Lung bases are clear. Hepatobiliary: Diffuse fatty infiltration of the liver. No focal liver lesions. Gallbladder and bile ducts are unremarkable. Pancreas: Infiltration and edema around the head and body of the pancreas. No loculated fluid collections. Pancreatic parenchyma is homogeneous with normal enhancement. Changes are consistent with acute edematous interstitial pancreatitis. No pancreatic ductal dilatation. Spleen: Normal in size without focal abnormality. Adrenals/Urinary Tract: Adrenal glands are unremarkable. Kidneys are normal, without renal calculi, focal lesion, or hydronephrosis. Bladder wall is diffusely thickened possibly indicating cystitis. Stomach/Bowel: Stomach, small bowel, and colon are not abnormally distended. There is mild edema in the colonic wall at the hepatic flexure, likely representing reactive inflammation. Appendix is normal. Vascular/Lymphatic: No significant vascular findings are present. No enlarged abdominal or  pelvic lymph nodes. Reproductive: Prostate is unremarkable. Other: No free air or free fluid in the abdomen. Abdominal wall musculature appears intact. Musculoskeletal: No acute or significant osseous  findings. IMPRESSION: 1. Infiltration and edema around the head and body of the pancreas consistent with acute edematous interstitial pancreatitis. No loculated fluid collections. No evidence of pancreatic necrosis. 2. Diffuse fatty infiltration of the liver. 3. Bladder wall thickening may indicate cystitis. Electronically Signed   By: Burman NievesWilliam  Stevens M.D.   On: 12/28/2018 00:18   Dg Abd 2 Views  Result Date: 01/04/2019 CLINICAL DATA:  Per order: ileus. Pt reported left upper quadrant pain this morning. EXAM: ABDOMEN - 2 VIEW COMPARISON:  the previous day's study FINDINGS: Stable nasogastric tube in the decompressed stomach. Multiple gas dilated small bowel loops with fluid levels, similar degree of dilatation since prior study. The colon appears decompressed. No free air. Regional bones unremarkable. IMPRESSION: 1. Persistent small bowel obstruction. 2. No free air. Electronically Signed   By: Corlis Leak  Hassell M.D.   On: 01/04/2019 09:40   Dg Abd 2 Views  Result Date: 01/03/2019 CLINICAL DATA:  Abdominal distension EXAM: ABDOMEN - 2 VIEW COMPARISON:  January 02, 2019 FINDINGS: Supine and upright images obtained. Nasogastric tube tip and side port are in the stomach. There remains generalized small bowel dilatation with scattered air-fluid levels in a pattern felt to be indicative bowel obstruction. A small amount of air is noted in the distal sigmoid and rectum. No free air. Small left pleural effusion with left base atelectasis noted. IMPRESSION: Persistent bowel dilatation with air fluid level suggesting persistent obstruction. No free air. Nasogastric tube tip and side port in stomach. Small left pleural effusion with left base atelectasis. Electronically Signed   By: Bretta BangWilliam  Woodruff III M.D.   On: 01/03/2019 08:21   Dg Abd Portable 2v  Result Date: 01/02/2019 CLINICAL DATA:  Ileus. EXAM: PORTABLE ABDOMEN - 2 VIEW COMPARISON:  01/01/2019.  12/31/2018. FINDINGS: NG tube noted with tip over the stomach in  stable position. Soft tissue structures are unremarkable. Severe distention of small bowel loops again noted. Relative paucity of colonic gas noted. Stool in the colon. Findings again worrisome for small bowel obstruction. No significant interim change noted from prior exam. IMPRESSION: NG tube noted with tip over the stomach in stable position. Severe distention of small bowel loops worrisome for small bowel obstruction again noted. No significant interim change from prior exam. Electronically Signed   By: Maisie Fushomas  Register   On: 01/02/2019 06:05   Dg Abd Portable 2v  Result Date: 01/01/2019 CLINICAL DATA:  Follow-up ileus. EXAM: PORTABLE ABDOMEN - 2 VIEW COMPARISON:  Abdominal x-rays from yesterday. FINDINGS: Unchanged enteric tube. Interval decrease in the number, but not the caliber of small bowel loops, which remain severely dilated centrally. A small amount of stool is seen in the right and left colon and rectum. No pneumoperitoneum. No acute osseous abnormality. IMPRESSION: 1. Persistent severe small bowel dilatation, although the number of dilated small bowel loops has decreased when compared to prior study. Electronically Signed   By: Obie DredgeWilliam T Derry M.D.   On: 01/01/2019 14:55   Dg Abd Portable 2v  Result Date: 12/31/2018 CLINICAL DATA:  Distended abdomen. Ileus versus small bowel obstruction. EXAM: PORTABLE ABDOMEN - 2 VIEW COMPARISON:  12/30/2018. FINDINGS: NG tube noted coiled stomach in stable position. Persistent severe distention of small bowel loops. Small bowel loops measure up to 6 cm in diameter. There is relative paucity of intra colonic gas. Small  amount of stool in the right colon. Findings again suggest small bowel obstruction. No free air. IMPRESSION: 1.  NG tube in stable position. 2. Persistent severe distention of small bowel loops with relative paucity of intra colonic gas. Findings worrisome for small bowel obstruction. Electronically Signed   By: Maisie Fus  Register   On:  12/31/2018 10:29   Dg Abd Portable 2v  Result Date: 12/30/2018 CLINICAL DATA:  Abdominal distension in a patient with pancreatitis. EXAM: PORTABLE ABDOMEN - 2 VIEW COMPARISON:  Single-view of the abdomen 12/29/2018. FINDINGS: NG tube remains in place. Gaseous distention of small bowel persists without marked change. Only small volume of stool is seen in the ascending colon. IMPRESSION: Unchanged gaseous with little stool in the colon most worrisome distention of small bowel for obstruction. Electronically Signed   By: Drusilla Kanner M.D.   On: 12/30/2018 13:30   US Abdomen Limited Ruq  Result Date: 12/27/2018 CLINICAL DATA:  28 year old male with right upper quadrant abdominal pain for 2 months, but progressed in the past 2 days. EXAM: ULTRASOUND ABDOMEN LIMITED RIGHT UPPER QUADRANT COMPARISON:  None. FINDINGS: Gallbladder: No gallstones or wall thickening visualized. No sonographic Murphy sign noted by sonographer. Common bile duct: Diameter: 3 millimeters, normal. Liver: Echogenic liver (image 15). No discrete liver lesion. No intrahepatic biliary ductal dilatation. Portal vein is patent on color Doppler imaging with normal direction of blood flow towards the liver. Other findings: Negative visible right kidney. IMPRESSION: 1. Hepatic steatosis. 2. Negative gallbladder.  No evidence of biliary obstruction. Electronically Signed   By: Odessa Fleming M.D.   On: 12/27/2018 23:26      Jshon Ibe,MD 01/04/2019, 4:09 PM   CC No ref. provider found

## 2019-01-04 NOTE — Progress Notes (Signed)
MD aware that patient is Tachy and causing MEWS to go to yellow. OK for VS to continue as routine. Will continue to monitor.

## 2019-01-04 NOTE — Progress Notes (Signed)
PROGRESS NOTE    Charles OverlieZachary Dougherty  ZOX:[id number] DOB: 01/24/1991 DOA: 12/27/2018 PCP: Patient, No Pcp Per    Brief Narrative:  Patient is a 28 year old male with history of alcohol abuse, substance abuse, smoker who presents to the emergencydepartment with complaints of abdominal pain. Admitted for the management of acute pancreatitis related to alcohol. Incidentally found to have severe hypertriglyceridemia.He has positive family history.  Hospital course remarkable for abdominal discomfort, distention .Abdominal x-ray suggested early bowel obstruction.  General surgery consulted and he was started on conservative management with NG tube decompression, n.p.o. status. Unclear if the pancreatitis was precipitated by alcohol or hypertriglyceridemia.  Due to severe hypertriglyceridemia ,we started on insulin drip and moved to stepdown.  GI also consulted.   Assessment & Plan:   Principal Problem:   Acute alcoholic pancreatitis Active Problems:   Hypertriglyceridemia   Pancreatitis   ETOH abuse   Drug abuse (HCC)   Cystitis   Ileus (HCC)   Abdominal pain   SBO (small bowel obstruction) (HCC)   Hypophosphatemia   Hypomagnesemia   Hypocalcemia   AKI (acute kidney injury) (HCC)   Anemia  1 acute alcoholic pancreatitis versus acute pancreatitis precipitated by hypertriglyceridemia Patient had presented with abdominal pain nausea and vomiting noted to have significantly elevated lipase levels and transaminitis.  Patient noted to be every day drinker usually drinks beer.  Patient placed on conservative management with IV fluids, n.p.o. and pain management.  CT abdomen and pelvis which was done did show infiltration and edema around the head and body of the pancreas consistent with acute edematous interstitial pancreatitis.  No loculated fluid collections, no evidence of pancreatic necrosis.  Patient currently with NG tube in place states some improvement with abdominal pain.  Blood pressure  slightly elevated likely secondary to current pain.  IV fluids have been decreased to 50 cc/h.  Continue supportive care pain management.  NG tube was to be discontinued 01/01/2019, and was clamped however patient unable to tolerate and as such NG tube was placed back to suction.  NG tube with output of 1250 cc over the past 24 hours.  Patient will clamping trial and trial of clear liquids on 01/03/2019 however patient was reluctant at that time.  Patient reluctant currently.  Patient with significant output from NG tube over the past 24 hours.  May have ice chips.  Continue supportive care.  Mobilization.  GI following.  General surgery has signed off.  2.  Severe hypertriglyceridemia Patient with family history of hypertriglyceridemia.  Patient's uncle noted to have this.  Patient noted to have triglycerides levels greater than 5000 with repeat labs confirming it.  Patient was placed on the insulin drip with significant improvement with elevated triglyceride levels.  Triglycerides at 375 on 01/02/2019.  Insulin drip has been discontinued as patient was starting to have hypoglycemia and triglyceride levels less than 500.  Will need to be on the fenofibrate on discharge.  Will need outpatient follow-up with PCP and probable referral to endocrinology.  Alcohol cessation has been stressed to patient.  3.  Small bowel obstruction versus ileus secondary to pancreatitis Patient with930cc output from NG tube over the past 24 hours.  Abdominal films with no significant change with dilated loops of bowel.  Patient passing flatus.  Patient passing flatus.  Patient states had bowel movements this morning.  Still with some epigastric abdominal discomfort.  Abdominal films with no significant improvement.  Clamping trial was to be done with a trial of clear liquids on  01/03/2019 however patient was reluctant and clamping trial was not done.  Patient currently reluctant and wants to see how he does this morning due to  significant abdominal discomfort.  Placed on simethicone 160 mg 4 times daily.  Will reassess and may try a clamping trial later on this afternoon with clears.  Give 4 rounds of IV potassium to keep potassium greater than 4.  Magnesium at 2.2.  Supportive care.  Mobilize.  GI following.   4.  Hypophosphatemia/hypokalemia Repleted.  Phosphorus level at 3.8.   5.  Anemia Likely dilutional.  Patient with no overt bleeding.  Anemia panel consistent with anemia of chronic disease/iron deficiency anemia.  Iron level at 32.  Ferritin elevated at 1383.  Hemoglobin currently at 8.3 from 7.1 from 8.9 from 13.2.  FOBT negative.  Status post 1 unit packed red blood cells 01/02/2019.  Continue PPI.  May need a dose of IV Feraheme.  Per GI upper endoscopy to be done in the outpatient setting.  GI following.  6.  Hypocalcemia/hypoalbuminemia Hypocalcemia associated likely with pancreatitis.  Patient also noted to have a severe hypoalbuminemia of 1.9.  Corrected calcium at 9.32.  Patient given some doses of IV calcium gluconate.  Follow.   7.  Transaminitis Images done did not show any gallbladder stones or inflammation.  Patient noted to have diffuse fatty liver.  LFTs trending down.  Follow.    8.  Thrombocytopenia Likely secondary to alcoholic liver disease.  Lovenox discontinued.  No overt bleeding.  Platelet count improving currently at 213.  Follow.  9.  Suspected cystitis Per CT imaging.  Patient currently asymptomatic.  Urinalysis unremarkable.  No need for antibiotics at this time.  10.  Acute kidney injury Resolved with hydration.  Follow.   11.  Hypoglycemic events Felt secondary to insulin drip.  Patient also noted to be n.p.o.  Insulin drip has been discontinued.  CBG 94 this morning.  Continue D5 normal saline.  Follow.   12 alcohol abuse No signs of withdrawal.  Alcohol cessation stressed to patient.  Social work consulted.  Follow.   DVT prophylaxis: SCDs Code Status: Full Family  Communication: Updated patient and mother at bedside. Disposition Plan: Likely home once ileus/small bowel obstruction has resolved and tolerating oral intake when okay with GI.    Consultants:   General surgery: Dr.Tsuei 12/29/2018  Gastroenterology: Dr. Barron Alvine 12/30/2018  Procedures:   CT abdomen and pelvis 12/28/2018  Right upper quadrant ultrasound 12/27/2018  Abdominal films 12/29/2018, 12/30/2018, 12/31/2018, 01/01/2019, 01/02/2019, 01/03/2019, 01/04/2019  Transfused 1 unit packed red blood cells 01/02/2019  Antimicrobials:   None   Subjective: Patient states had a bowel movement last night however had some significant abdominal discomfort with some nausea.  It was noted that NG tube was clamped however patient aspirated to place back on suction due to abdominal discomfort/pain.  Passing flatus.  Patient states had bowel movement early this morning. NG tube was not clamped and patient was not tried on clear liquids yesterday.  Patient a little bit hesitant for NG tube to be clamped today with a trial of clear liquids wants to see how he does this morning.  Objective: Vitals:   01/03/19 2344 01/04/19 0324 01/04/19 0511 01/04/19 0957  BP: (!) 147/89 (!) 149/95 (!) 141/88 (!) 143/89  Pulse: (!) 111 (!) 121 (!) 110 (!) 105  Resp: Temp: 97.8 F (36.6 C) 100 F (37.8 C) 100.1 F (37.8 C) 99.2 F (37.3 C)  TempSrc:  Oral Oral Oral Oral  SpO2: 97% 90% 93% 96%  Weight:   78.3 kg   Height:        Intake/Output Summary (Last 24 hours) at 01/04/2019 1123 Last data filed at 01/04/2019 0600 Gross per 24 hour  Intake 1701.59 ml  Output 2900 ml  Net -1198.41 ml   Filed Weights   01/02/19 0813 01/03/19 0523 01/04/19 0511  Weight: 81 kg 80.9 kg 78.3 kg    Examination:  General exam: NG tube in place.  Respiratory system: Lungs clear to auscultation bilaterally.  No wheezes, no crackles, no rhonchi.  Normal respiratory effort. Cardiovascular system: Regular rate  rhythm no murmurs rubs or gallops.  No JVD.  No lower extremity edema. Gastrointestinal system: Abdomen is softer, less distended, hypoactive bowel sounds, some tenderness to palpation.  No rebound.  No guarding.  Central nervous system: Alert and oriented. No focal neurological deficits. Extremities: Symmetric 5 x 5 power. Skin: No rashes, lesions or ulcers Psychiatry: Judgement and insight appear normal. Mood & affect appropriate.     Data Reviewed: I have personally reviewed following labs and imaging studies  CBC: Recent Labs  Lab 12/30/18 1534 12/31/18 0525 01/01/19 0550 01/02/19 0951 01/02/19 1928 01/03/19 0320 01/04/19 0518  WBC 8.6 4.2 7.9 10.0  --  12.5* 13.3*  NEUTROABS 6.5 2.9 6.0 8.1*  --   --  11.4*  HGB 11.9* 13.2 8.9* 7.1* 9.2* 8.3* 8.5*  HCT 34.9* 40.7 27.1* 21.9* 27.7* 24.9* 26.3*  MCV 102.0* 102.8* 101.1* 102.3*  --  98.0 101.9*  PLT 95* 53* 83* 109*  --  134* 213   Basic Metabolic Panel: Recent Labs  Lab 12/30/18 1532 12/31/18 0525 12/31/18 1603 01/01/19 0550 01/01/19 1638 01/02/19 0622 01/03/19 0320 01/04/19 0518  NA 132* 131* 130* 131* 134* 135 135 133*  K 5.4* 3.0* 3.6 4.3 3.8 3.8 3.9 3.8  CL 109 106 101 102 103 103 104 100  CO2 15* 17* 21* 23 24 23 23 23   GLUCOSE 52* 148* 61* 125* 126* 118* 106* 102*  BUN 20 12 8 7 8 8 8 7   CREATININE 1.12 0.88 0.63 0.69 0.59* 0.61 0.63 0.65  CALCIUM 5.4* 5.5* 6.6* 6.9* 7.1* 7.4* 7.8* 7.9*  MG 2.5* 2.0 2.1 2.0  --   --  2.2  --   PHOS 1.5* 1.1* 2.0* 2.4*  --  2.8 3.8  --    GFR: Estimated Creatinine Clearance: 145.7 mL/min (by C-G formula based on SCr of 0.65 mg/dL). Liver Function Tests: Recent Labs  Lab 12/30/18 0504 01/01/19 0550 01/02/19 0622 01/03/19 0320 01/04/19 0518  AST 86* 72* 53* 50* 35  ALT 44 47* 41 40 31  ALKPHOS 46 53 57 71 72  BILITOT 2.5* 2.8* 1.8* 1.9* 1.6*  PROT 4.3* 4.8* 4.8* 5.1* 5.2*  ALBUMIN 1.9* 2.1* 2.0* 2.1* 2.1*   Recent Labs  Lab 12/30/18 1532 01/02/19 0622    LIPASE 142* 67*   No results for input(s): AMMONIA in the last 168 hours. Coagulation Profile: No results for input(s): INR, PROTIME in the last 168 hours. Cardiac Enzymes: No results for input(s): CKTOTAL, CKMB, CKMBINDEX, TROPONINI in the last 168 hours. BNP (last 3 results) No results for input(s): PROBNP in the last 8760 hours. HbA1C: No results for input(s): HGBA1C in the last 72 hours. CBG: Recent Labs  Lab 01/03/19 1558 01/03/19 2045 01/04/19 0032 01/04/19 0512 01/04/19 0826  GLUCAP 99 89 103* 98 94   Lipid Profile: Recent Labs  01/02/19 0622  TRIG 375*   Thyroid Function Tests: No results for input(s): TSH, T4TOTAL, FREET4, T3FREE, THYROIDAB in the last 72 hours. Anemia Panel: No results for input(s): VITAMINB12, FOLATE, FERRITIN, TIBC, IRON, RETICCTPCT in the last 72 hours. Sepsis Labs: Recent Labs  Lab 12/30/18 0843  LATICACIDVEN 1.5    Recent Results (from the past 240 hour(s))  Urine culture     Status: None   Collection Time: 12/28/18  5:04 AM  Result Value Ref Range Status   Specimen Description   Final    URINE, RANDOM Performed at [facility], [address], [place], [place] [address]    Special Requests   Final    NONE Performed at [facility], [address] [place], [place] [address]    Culture   Final    NO GROWTH Performed at [facility], [address] [place], [place] [address]    Report Status 12/29/2018 FINAL  Final  MRSA PCR Screening     Status: None   Collection Time: 12/30/18 11:24 AM  Result Value Ref Range Status   MRSA by PCR NEGATIVE NEGATIVE Final    Comment:        The GeneXpert MRSA Assay (FDA approved for NASAL specimens only), is one component of a comprehensive MRSA colonization surveillance program. It is not intended to diagnose MRSA infection nor to guide or monitor treatment for MRSA infections. Performed at [facility], [address], [place], [place] [address]          Radiology Studies: Dg Abd 2 Views  Result Date: 01/04/2019 CLINICAL DATA:  Per order: ileus. Pt reported left upper quadrant pain this morning. EXAM: ABDOMEN - 2 VIEW COMPARISON:  the previous day's study FINDINGS: Stable nasogastric tube in the decompressed stomach. Multiple gas dilated small bowel loops with fluid levels, similar degree of dilatation since prior study. The colon appears decompressed. No free air. Regional bones unremarkable. IMPRESSION: 1. Persistent small bowel obstruction. 2. No free air. Electronically Signed   By: Corlis Leak M.D.   On: 01/04/2019 09:40   Dg Abd 2 Views  Result Date: 01/03/2019 CLINICAL DATA:  Abdominal distension EXAM: ABDOMEN - 2 VIEW COMPARISON:  January 02, 2019 FINDINGS: Supine and upright images obtained. Nasogastric tube tip and side port are in the stomach. There remains generalized small bowel dilatation with scattered air-fluid levels in a pattern felt to be indicative bowel obstruction. A small amount of air is noted in the distal sigmoid and rectum. No free air. Small left pleural effusion with left base atelectasis noted. IMPRESSION: Persistent bowel dilatation with air fluid level suggesting persistent obstruction. No free air. Nasogastric tube tip and side port in stomach. Small left pleural effusion with left base atelectasis. Electronically Signed   By: Bretta Bang III M.D.   On: 01/03/2019 08:21        Scheduled Meds:  Chlorhexidine Gluconate Cloth  6 each Topical Daily   nicotine  14 mg Transdermal Daily   pantoprazole (PROTONIX) IV  40 mg Intravenous Q12H   Continuous Infusions:  sodium chloride Stopped (01/02/19 1042)   dextrose 5 % and 0.9% NaCl 50 mL/hr at 01/04/19 0600   potassium chloride       LOS: 7 days    Time spent: 40 mins    Ramiro Harvest, MD [facility]  If 7PM-7AM, please contact night-coverage [email or web address] 01/04/2019, 11:23 AM

## 2019-01-05 ENCOUNTER — Inpatient Hospital Stay (HOSPITAL_COMMUNITY): Payer: Federal, State, Local not specified - PPO

## 2019-01-05 ENCOUNTER — Inpatient Hospital Stay: Payer: Self-pay

## 2019-01-05 ENCOUNTER — Encounter (HOSPITAL_COMMUNITY): Payer: Self-pay

## 2019-01-05 DIAGNOSIS — R509 Fever, unspecified: Secondary | ICD-10-CM | POA: Diagnosis not present

## 2019-01-05 DIAGNOSIS — J181 Lobar pneumonia, unspecified organism: Secondary | ICD-10-CM

## 2019-01-05 DIAGNOSIS — J189 Pneumonia, unspecified organism: Secondary | ICD-10-CM

## 2019-01-05 LAB — CBC
HCT: 23.8 % — ABNORMAL LOW (ref 39.0–52.0)
Hemoglobin: 8 g/dL — ABNORMAL LOW (ref 13.0–17.0)
MCH: 34.5 pg — ABNORMAL HIGH (ref 26.0–34.0)
MCHC: 33.6 g/dL (ref 30.0–36.0)
MCV: 102.6 fL — ABNORMAL HIGH (ref 80.0–100.0)
Platelets: 316 10*3/uL (ref 150–400)
RBC: 2.32 MIL/uL — ABNORMAL LOW (ref 4.22–5.81)
RDW: 14.2 % (ref 11.5–15.5)
WBC: 14.8 10*3/uL — ABNORMAL HIGH (ref 4.0–10.5)
nRBC: 0.1 % (ref 0.0–0.2)

## 2019-01-05 LAB — URINALYSIS, ROUTINE W REFLEX MICROSCOPIC
BILIRUBIN URINE: NEGATIVE
Glucose, UA: NEGATIVE mg/dL
Hgb urine dipstick: NEGATIVE
KETONES UR: 80 mg/dL — AB
Leukocytes,Ua: NEGATIVE
NITRITE: NEGATIVE
Protein, ur: NEGATIVE mg/dL
Specific Gravity, Urine: 1.02 (ref 1.005–1.030)
pH: 5 (ref 5.0–8.0)

## 2019-01-05 LAB — BASIC METABOLIC PANEL
ANION GAP: 11 (ref 5–15)
BUN: 10 mg/dL (ref 6–20)
CO2: 21 mmol/L — ABNORMAL LOW (ref 22–32)
Calcium: 7.8 mg/dL — ABNORMAL LOW (ref 8.9–10.3)
Chloride: 102 mmol/L (ref 98–111)
Creatinine, Ser: 0.7 mg/dL (ref 0.61–1.24)
GFR calc non Af Amer: >60 mL/min (ref >60)
Glucose, Bld: 96 mg/dL (ref 70–99)
POTASSIUM: 3.9 mmol/L (ref 3.5–5.1)
Sodium: 134 mmol/L — ABNORMAL LOW (ref 135–145)

## 2019-01-05 LAB — GLUCOSE, CAPILLARY
GLUCOSE-CAPILLARY: 78 mg/dL (ref 70–99)
Glucose-Capillary: 95 mg/dL (ref 70–99)
Glucose-Capillary: 98 mg/dL (ref 70–99)
Glucose-Capillary: 90 mg/dL (ref 70–99)
Glucose-Capillary: 92 mg/dL (ref 70–99)

## 2019-01-05 LAB — MAGNESIUM: Magnesium: 2.1 mg/dL (ref 1.7–2.4)

## 2019-01-05 LAB — STREP PNEUMONIAE URINARY ANTIGEN: STREP PNEUMO URINARY ANTIGEN: NEGATIVE

## 2019-01-05 MED ORDER — POTASSIUM CHLORIDE 10 MEQ/100ML IV SOLN
10.0000 meq | INTRAVENOUS | Status: AC
  Administered 2019-01-05 (×4): 10 meq via INTRAVENOUS
  Filled 2019-01-05: qty 100

## 2019-01-05 MED ORDER — SODIUM CHLORIDE (PF) 0.9 % IJ SOLN
INTRAMUSCULAR | Status: AC
  Filled 2019-01-05: qty 50

## 2019-01-05 MED ORDER — IOHEXOL 300 MG/ML  SOLN
100.0000 mL | Freq: Once | INTRAMUSCULAR | Status: AC | PRN
  Administered 2019-01-05: 100 mL via INTRAVENOUS

## 2019-01-05 MED ORDER — LEVOFLOXACIN IN D5W 750 MG/150ML IV SOLN
750.0000 mg | Freq: Every day | INTRAVENOUS | Status: DC
  Administered 2019-01-05 – 2019-01-06 (×2): 750 mg via INTRAVENOUS
  Filled 2019-01-05 (×2): qty 150

## 2019-01-05 MED ORDER — LEVOFLOXACIN IN D5W 750 MG/150ML IV SOLN: 750.0000 mg | INTRAVENOUS | Status: DC

## 2019-01-05 MED ORDER — LEVOFLOXACIN IN D5W 500 MG/100ML IV SOLN: 500.0000 mg | INTRAVENOUS | Status: DC

## 2019-01-05 MED ORDER — IOHEXOL 300 MG/ML  SOLN
30.0000 mL | Freq: Once | INTRAMUSCULAR | Status: AC | PRN
  Administered 2019-01-05: 30 mL via ORAL

## 2019-01-05 NOTE — Progress Notes (Signed)
Called by primary RN for patients MEWs score. MEWs score most-likely elevated due to the patient being febrile. RN to recheck patient's temperature and follow-up with rapid response nurse if patient's MEWs score does not come down. Primary RN does not feel rapid response RN needs to come to bedside to assess patient at this time. RN to consult rapid response RN for further needs.

## 2019-01-05 NOTE — Progress Notes (Addendum)
Daily Rounding Note  01/05/2019, 9:39 AM  LOS: 8 days   SUBJECTIVE:   Chief complaint:      Tachy in 1teens.  No hypotension.   Used 5 mg Morphine yesterday, down from higher amounts in previous days.   Today has pain in lateral RUQ, previously was in LUQ.   3 or 4 BM'S in last 18 hours.  NGT irritating and has slight cough.  . NGT output 1250 ml >> 470 ml in last 2 days    OBJECTIVE:         Vital signs in last 24 hours:    Temp:  [98.2 F (36.8 C)-101.5 F (38.6 C)] 99.8 F (37.7 C) (03/22 0646) Pulse Rate:  [105-119] 112 (03/22 0646) Resp:  [16-22] 22 (03/22 0646) BP: (127-145)/(85-96) 136/85 (03/22 0646) SpO2:  [92 %-96 %] 93 % (03/22 0646) Weight:  [77.5 kg] 77.5 kg (03/22 0549) Last BM Date: 01/03/19 Filed Weights   01/03/19 0523 01/04/19 0511 01/05/19 0549  Weight: 80.9 kg 78.3 kg 77.5 kg   General: pale, not ill looking   Heart: RRR Chest: clear bil.   Abdomen: soft, Minimal tenderness in RUQ.  Active BS.  ND  Extremities: no CCE Neuro/Psych:  Oriented x 3.  No tremors.    Intake/Output from previous day: 03/21 0701 - 03/22 0700 In: 1850 [P.O.:600; I.V.:1250] Out: 920 [Urine:450; Emesis/NG output:470]  Intake/Output this shift: No intake/output data recorded.  Lab Results: Recent Labs    01/03/19 0320 01/04/19 0518 01/05/19 0548  WBC 12.5* 13.3* 14.8*  HGB 8.3* 8.5* 8.0*  HCT 24.9* 26.3* 23.8*  PLT 134* 213 316   BMET Recent Labs    01/03/19 0320 01/04/19 0518 01/05/19 0548  NA 135 133* 134*  K 3.9 3.8 3.9  CL 104 100 102  CO2 23 23 21*  GLUCOSE 106* 102* 96  BUN 8 7 10   CREATININE 0.63 0.65 0.70  CALCIUM 7.8* 7.9* 7.8*   LFT Recent Labs    01/03/19 0320 01/04/19 0518  PROT 5.1* 5.2*  ALBUMIN 2.1* 2.1*  AST 50* 35  ALT 40 31  ALKPHOS 71 72  BILITOT 1.9* 1.6*   PT/INR No results for input(s): LABPROT, INR in the last 72 hours. Hepatitis Panel No results for  input(s): HEPBSAG, HCVAB, HEPAIGM, HEPBIGM in the last 72 hours.  Studies/Results: Dg Abd 2 Views  Result Date: 01/04/2019 CLINICAL DATA:  Per order: ileus. Pt reported left upper quadrant pain this morning. EXAM: ABDOMEN - 2 VIEW COMPARISON:  the previous day's study FINDINGS: Stable nasogastric tube in the decompressed stomach. Multiple gas dilated small bowel loops with fluid levels, similar degree of dilatation since prior study. The colon appears decompressed. No free air. Regional bones unremarkable. IMPRESSION: 1. Persistent small bowel obstruction. 2. No free air. Electronically Signed   By: Corlis Leak M.D.   On: 01/04/2019 09:40   Dg Abd Acute 2+v W 1v Chest  Result Date: 01/05/2019 CLINICAL DATA:  Ileus, NG tube, Abdomen pain/swelling EXAM: DG ABDOMEN ACUTE W/ 1V CHEST COMPARISON:  the previous day's exam, and earlier studies FINDINGS: Heart size normal. Left pleural effusion, and consolidation/atelectasis at the left lung base, which have developed since CT 12/27/2018. Nasogastric tube to the decompressed stomach. Dilated small bowel loops with fluid levels, similar in number and degree of dilatation since prior study. Colon decompressed. Left pelvic phlebolith. Regional bones unremarkable. IMPRESSION: 1. Nasogastric tube to the stomach. 2. Persistent small bowel  obstruction. 3. Left lower lung atelectasis/consolidation with effusion Electronically Signed   By: Corlis Leak M.D.   On: 01/05/2019 08:59   Scheduled Meds: . Chlorhexidine Gluconate Cloth  6 each Topical Daily  . metoCLOPramide (REGLAN) injection  10 mg Intravenous Q8H  . nicotine  14 mg Transdermal Daily  . pantoprazole (PROTONIX) IV  40 mg Intravenous Q12H  . simethicone  160 mg Oral QID   Continuous Infusions: . sodium chloride Stopped (01/02/19 1042)  . dextrose 5 % and 0.9% NaCl 50 mL/hr at 01/04/19 2259  . levofloxacin (LEVAQUIN) IV    . potassium chloride     PRN Meds:.sodium chloride, dextrose, ketorolac,  labetalol, morphine injection, ondansetron **OR** ondansetron (ZOFRAN) IV, phenol, zolpidem   ASSESMENT:   *   Acute ETOH pancreatitis with ileus.   Lipase 840>> 67.  High trigs iimproved.   Xray today still with SBO.  WBCs rising.    *   Macrocytic anemia. 1 U PRBC 3/19.  Hgb 7.1 >> 8  FOBT negative 3/20.    *  L Low lobe atx/consolidation.  Day 2 levaquin.    *   Fatty liver.   ETOH hepatitis, LFTs improved.  Hep ABC negative.    *   polysub abuse.    *   Resolved thrombocytopenia.      PLAN   *   Total ETOH abstinence.   *   Continue IV Reglan. Leave NGT to suction   *   Repeat CT ab/pelvis.    *    TPN per pharmacy.      Jennye Moccasin  01/05/2019, 9:39 AM Phone [phone]    Attending physician's note   I have taken an interval history, reviewed the chart and examined the patient. I agree with the Advanced Practitioner's note, impression and recommendations.   28 year old with acute pancreatitis due to alcohol and hypertriglyceridemia complicated by ileus and now with left lower lobe infiltrate.  Unable to tolerate p.o. has NG tube in. Plan: Re-CT Abdo/pelvis, TPN, on IV Levaquin, continue supportive care for now.  Edman Circle, MD

## 2019-01-05 NOTE — Progress Notes (Signed)
Spoke with Arline Asp RN re PICC placement for TNA.  Notified blood cultures pending from yesterday and pt has been febrile.  Per pharmacy note, TNA will not be started until 01/06/19.  Cindy RN states pt has adequate PIV access at this time.  PICC to be placed on 01/06/19, Dr Janee Morn notified via messaging.

## 2019-01-05 NOTE — Progress Notes (Signed)
PROGRESS NOTE    Charles Dougherty  ZOX:[id number] DOB: 10/30/1990 DOA: 12/27/2018 PCP: Patient, No Pcp Per    Brief Narrative:  Patient is a 28 year old male with history of alcohol abuse, substance abuse, smoker who presents to the emergencydepartment with complaints of abdominal pain. Admitted for the management of acute pancreatitis related to alcohol. Incidentally found to have severe hypertriglyceridemia.He has positive family history.  Hospital course remarkable for abdominal discomfort, distention .Abdominal x-ray suggested early bowel obstruction.  General surgery consulted and he was started on conservative management with NG tube decompression, n.p.o. status. Unclear if the pancreatitis was precipitated by alcohol or hypertriglyceridemia.  Due to severe hypertriglyceridemia ,we started on insulin drip and moved to stepdown.  GI also consulted.   Assessment & Plan:   Principal Problem:   Acute alcoholic pancreatitis Active Problems:   Hypertriglyceridemia   Pancreatitis   ETOH abuse   Drug abuse (HCC)   Cystitis   Ileus (HCC)   Abdominal pain   SBO (small bowel obstruction) (HCC)   Hypophosphatemia   Hypomagnesemia   Hypocalcemia   AKI (acute kidney injury) (HCC)   Anemia   Fever  1 acute alcoholic pancreatitis versus acute pancreatitis precipitated by hypertriglyceridemia Patient had presented with abdominal pain nausea and vomiting noted to have significantly elevated lipase levels and transaminitis.  Patient noted to be every day drinker usually drinks beer.  Patient placed on conservative management with IV fluids, n.p.o. and pain management.  CT abdomen and pelvis which was done did show infiltration and edema around the head and body of the pancreas consistent with acute edematous interstitial pancreatitis.  No loculated fluid collections, no evidence of pancreatic necrosis.  Patient currently with NG tube in place states some improvement with abdominal pain.  Blood  pressure slightly elevated likely secondary to current pain.  IV fluids have been decreased to 50 cc/h.  Continue supportive care pain management.  NG tube was to be discontinued 01/01/2019, and was clamped however patient unable to tolerate and as such NG tube was placed back to suction.  NG tube with output of 1250 cc over the past 24 hours.  Patient will clamping trial and trial of clear liquids on 01/03/2019 however patient was reluctant at that time.  Patient reluctant currently.  Patient with 470 cc output from NG tube over the past 24 hours.  Patient noted to have fever early this morning as such CT abdomen and pelvis have been ordered.  Patient also placed on Levaquin.  May have ice chips.  Continue supportive care.  Mobilization.  GI following.  General surgery has signed off. TPN has been ordered per GI.  PICC line ordered..  As blood cultures were drawn today PICC line team wants to wait another 24 hours to make sure blood cultures remain negative prior to placing PICC line.  2.  Severe hypertriglyceridemia Patient with family history of hypertriglyceridemia.  Patient's uncle noted to have this.  Patient noted to have triglycerides levels greater than 5000 with repeat labs confirming it.  Patient was placed on the insulin drip with significant improvement with elevated triglyceride levels.  Triglycerides at 375 on 01/02/2019.  Insulin drip has been discontinued as patient was starting to have hypoglycemia and triglyceride levels less than 500.  Will need to be on the fenofibrate on discharge.  Will need outpatient follow-up with PCP and probable referral to endocrinology.  Alcohol cessation has been stressed to patient.  3.  Small bowel obstruction versus ileus secondary to pancreatitis Patient  with930cc output from NG tube over the past 24 hours.  Abdominal films with no significant change with dilated loops of bowel.  Patient passing flatus.  Patient passing flatus.  Patient states had bowel  movements this morning and yesterday.  Still with some epigastric abdominal discomfort.  Abdominal films with no significant improvement.  Clamping trial was to be done with a trial of clear liquids on 01/03/2019 however patient was reluctant and clamping trial was not done.  Patient still with dilated bowels of loop seen on abdominal film.  Continue simethicone 160 mg p.o. 4 times daily.  Patient started on IV Reglan per gastroenterology.  Leave NG tube to suction per GI.  CT abdomen and pelvis ordered as patient noted to have a fever overnight with no significant improvement with ileus.  Keep potassium greater than 4.  Keep magnesium greater than 2.  Supportive care.  Mobilize.  Per GI.  TPN has been ordered per GI.  PICC line has been ordered.  Blood cultures ordered today and as such pink team prefers to wait at least 24 hours to ensure blood cultures remain negative prior to placing PICC line.  5.  Left lower lobe atelectasis/consolidation Per chest x-ray.  Patient noted to have a temp of 101.5 early this morning.  Patient with a worsening leukocytosis.  Check blood cultures x2.  Check a urine Legionella antigen.  Check a urine pneumococcus antigen.  Patient started on IV Levaquin per GI yesterday.  Increase Levaquin to 750 mg daily to cover for probable pneumonia.  Continue supportive care.  6.  Fever Likely secondary to problem #5.  Check blood cultures x2.  Chest x-ray with left lower lobe atelectasis versus consolidation.  Check a urine pneumococcus antigen.  Check a urine Legionella antigen.  Patient was started on IV Levaquin per GI on 01/04/2019 which we will continue for now.  7.  Hypophosphatemia/hypokalemia Repleted.  Phosphorus level at 3.8.   8.  Anemia Likely dilutional.  Patient with no overt bleeding.  Anemia panel consistent with anemia of chronic disease/iron deficiency anemia.  Iron level at 32.  Ferritin elevated at 1383.  Hemoglobin currently at 8.0 from 8.3 from 7.1 from 8.9 from  13.2.  FOBT negative.  Status post 1 unit packed red blood cells 01/02/2019.  Continue PPI.  May need a dose of IV Feraheme.  Per GI upper endoscopy to be done in the outpatient setting.  GI following.  9.  Hypocalcemia/hypoalbuminemia Hypocalcemia associated likely with pancreatitis.  Patient also noted to have a severe hypoalbuminemia of 1.9.  Corrected calcium at 9.32.  Patient given some doses of IV calcium gluconate early on during the hospitalization.  Follow.   10.  Transaminitis Images done did not show any gallbladder stones or inflammation.  Patient noted to have diffuse fatty liver.  LFTs trended down.  Follow.    11.  Thrombocytopenia Likely secondary to alcoholic liver disease.  Lovenox discontinued.  No overt bleeding.  Platelet count improving currently at 316.  Follow.  12.  Suspected cystitis Per CT imaging.  Patient currently asymptomatic.  Urinalysis unremarkable.  No need for antibiotics at this time.  13.  Acute kidney injury Resolved with hydration.  Follow.   14.  Hypoglycemic events Felt secondary to insulin drip.  Patient also noted to be n.p.o.  Insulin drip has been discontinued.  CBG 98 this morning.  Continue D5 normal saline at 50 cc/h.  Follow.   15 alcohol abuse No signs of withdrawal.  Alcohol  cessation stressed to patient.  Social work consulted.  Follow.   DVT prophylaxis: SCDs, ambulation Code Status: Full Family Communication: Updated patient and mother at bedside. Disposition Plan: Likely home once ileus/small bowel obstruction has resolved and tolerating oral intake when okay with GI.    Consultants:   General surgery: Dr.Tsuei 12/29/2018  Gastroenterology: Dr. Barron Alvine 12/30/2018  Procedures:   CT abdomen and pelvis 12/28/2018  Right upper quadrant ultrasound 12/27/2018  Abdominal films 12/29/2018, 12/30/2018, 12/31/2018, 01/01/2019, 01/02/2019, 01/03/2019, 01/04/2019  Transfused 1 unit packed red blood cells 01/02/2019  Acute abdominal  series 01/05/2019  CT abdomen and pelvis pending 01/05/2019  Antimicrobials:   None   Subjective: Patient sitting up in bed.  Just returned from abdominal films.  Patient noted to have a fever 101.5.  Patient with a slight cough which he states is nonproductive.  Patient had bowel movement this morning and yesterday per patient.  States he has been burping.  No flatus today.   Objective: Vitals:   01/05/19 0600 01/05/19 0614 01/05/19 0631 01/05/19 0646  BP: (!) 134/96 127/87 140/86 136/85  Pulse: (!) 112 (!) 110 (!) 117 (!) 112  Resp:  (!) 22 (!) 22 (!) 22  Temp:  99.9 F (37.7 C) 99.8 F (37.7 C) 99.8 F (37.7 C)  TempSrc:  Oral Oral Oral  SpO2: 92% 93% 93% 93%  Weight:      Height:        Intake/Output Summary (Last 24 hours) at 01/05/2019 1040 Last data filed at 01/05/2019 0700 Gross per 24 hour  Intake 1850 ml  Output 920 ml  Net 930 ml   Filed Weights   01/03/19 0523 01/04/19 0511 01/05/19 0549  Weight: 80.9 kg 78.3 kg 77.5 kg    Examination:  General exam: NG tube in place.  Respiratory system: Decreased breath sounds in the left base otherwise clear.  No crackles, no wheezing, no rhonchi.  Normal respiratory effort. Cardiovascular system: RRR no murmurs rubs or gallops.  No JVD.  No lower extremity edema.  Gastrointestinal system: Abdomen is less distended, hypoactive bowel sounds, no rebound, no guarding. Central nervous system: Alert and oriented. No focal neurological deficits. Extremities: Symmetric 5 x 5 power. Skin: No rashes, lesions or ulcers Psychiatry: Judgement and insight appear normal. Mood & affect appropriate.     Data Reviewed: I have personally reviewed following labs and imaging studies  CBC: Recent Labs  Lab 12/30/18 1534 12/31/18 0525 01/01/19 0550 01/02/19 0951 01/02/19 1928 01/03/19 0320 01/04/19 0518 01/05/19 0548  WBC 8.6 4.2 7.9 10.0  --  12.5* 13.3* 14.8*  NEUTROABS 6.5 2.9 6.0 8.1*  --   --  11.4*  --   HGB 11.9* 13.2  8.9* 7.1* 9.2* 8.3* 8.5* 8.0*  HCT 34.9* 40.7 27.1* 21.9* 27.7* 24.9* 26.3* 23.8*  MCV 102.0* 102.8* 101.1* 102.3*  --  98.0 101.9* 102.6*  PLT 95* 53* 83* 109*  --  134* 213 316   Basic Metabolic Panel: Recent Labs  Lab 12/31/18 0525 12/31/18 1603 01/01/19 0550 01/01/19 1638 01/02/19 0622 01/03/19 0320 01/04/19 0518 01/05/19 0548  NA 131* 130* 131* 134* 135 135 133* 134*  K 3.0* 3.6 4.3 3.8 3.8 3.9 3.8 3.9  CL 106 101 102 103 103 104 100 102  CO2 17* 21* 23 24 23 23 23  21*  GLUCOSE 148* 61* 125* 126* 118* 106* 102* 96  BUN 12 8 7 8 8 8 7 10   CREATININE 0.88 0.63 0.69 0.59* 0.61 0.63 0.65 0.70  CALCIUM 5.5* 6.6* 6.9* 7.1* 7.4* 7.8* 7.9* 7.8*  MG 2.0 2.1 2.0  --   --  2.2  --  2.1  PHOS 1.1* 2.0* 2.4*  --  2.8 3.8  --   --    GFR: Estimated Creatinine Clearance: 145.7 mL/min (by C-G formula based on SCr of 0.7 mg/dL). Liver Function Tests: Recent Labs  Lab 12/30/18 0504 01/01/19 0550 01/02/19 0622 01/03/19 0320 01/04/19 0518  AST 86* 72* 53* 50* 35  ALT 44 47* 41 40 31  ALKPHOS 46 53 57 71 72  BILITOT 2.5* 2.8* 1.8* 1.9* 1.6*  PROT 4.3* 4.8* 4.8* 5.1* 5.2*  ALBUMIN 1.9* 2.1* 2.0* 2.1* 2.1*   Recent Labs  Lab 12/30/18 1532 01/02/19 0622  LIPASE 142* 67*   No results for input(s): AMMONIA in the last 168 hours. Coagulation Profile: No results for input(s): INR, PROTIME in the last 168 hours. Cardiac Enzymes: No results for input(s): CKTOTAL, CKMB, CKMBINDEX, TROPONINI in the last 168 hours. BNP (last 3 results) No results for input(s): PROBNP in the last 8760 hours. HbA1C: No results for input(s): HGBA1C in the last 72 hours. CBG: Recent Labs  Lab 01/04/19 1638 01/04/19 2007 01/04/19 2342 01/05/19 0402 01/05/19 0809  GLUCAP 88 95 93 95 98   Lipid Profile: No results for input(s): CHOL, HDL, LDLCALC, TRIG, CHOLHDL, LDLDIRECT in the last 72 hours. Thyroid Function Tests: No results for input(s): TSH, T4TOTAL, FREET4, T3FREE, THYROIDAB in the last 72  hours. Anemia Panel: No results for input(s): VITAMINB12, FOLATE, FERRITIN, TIBC, IRON, RETICCTPCT in the last 72 hours. Sepsis Labs: Recent Labs  Lab 12/30/18 0843  LATICACIDVEN 1.5    Recent Results (from the past 240 hour(s))  Urine culture     Status: None   Collection Time: 12/28/18  5:04 AM  Result Value Ref Range Status   Specimen Description   Final    URINE, RANDOM Performed at [facility], [address] [place], [place] [address]    Special Requests   Final    NONE Performed at [facility], [address] [place], [place] [address]    Culture   Final    NO GROWTH Performed at [facility], [address], [place], [place] [address]    Report Status 12/29/2018 FINAL  Final  MRSA PCR Screening     Status: None   Collection Time: 12/30/18 11:24 AM  Result Value Ref Range Status   MRSA by PCR NEGATIVE NEGATIVE Final    Comment:        The GeneXpert MRSA Assay (FDA approved for NASAL specimens only), is one component of a comprehensive MRSA colonization surveillance program. It is not intended to diagnose MRSA infection nor to guide or monitor treatment for MRSA infections. Performed at [facility], [address] [place], [place] [address]          Radiology Studies: Dg Abd 2 Views  Result Date: 01/04/2019 CLINICAL DATA:  Per order: ileus. Pt reported left upper quadrant pain this morning. EXAM: ABDOMEN - 2 VIEW COMPARISON:  the previous day's study FINDINGS: Stable nasogastric tube in the decompressed stomach. Multiple gas dilated small bowel loops with fluid levels, similar degree of dilatation since prior study. The colon appears decompressed. No free air. Regional bones unremarkable. IMPRESSION: 1. Persistent small bowel obstruction. 2. No free air. Electronically Signed   By: Corlis Leak M.D.   On: 01/04/2019 09:40   Dg Abd Acute 2+v  W 1v Chest  Result Date: 01/05/2019 CLINICAL  DATA:  Ileus, NG tube, Abdomen pain/swelling EXAM: DG ABDOMEN ACUTE W/ 1V CHEST COMPARISON:  the previous day's exam, and earlier studies FINDINGS: Heart size normal. Left pleural effusion, and consolidation/atelectasis at the left lung base, which have developed since CT 12/27/2018. Nasogastric tube to the decompressed stomach. Dilated small bowel loops with fluid levels, similar in number and degree of dilatation since prior study. Colon decompressed. Left pelvic phlebolith. Regional bones unremarkable. IMPRESSION: 1. Nasogastric tube to the stomach. 2. Persistent small bowel obstruction. 3. Left lower lung atelectasis/consolidation with effusion Electronically Signed   By: Corlis Leak M.D.   On: 01/05/2019 08:59        Scheduled Meds:  Chlorhexidine Gluconate Cloth  6 each Topical Daily   metoCLOPramide (REGLAN) injection  10 mg Intravenous Q8H   nicotine  14 mg Transdermal Daily   pantoprazole (PROTONIX) IV  40 mg Intravenous Q12H   simethicone  160 mg Oral QID   Continuous Infusions:  sodium chloride Stopped (01/02/19 1042)   dextrose 5 % and 0.9% NaCl 50 mL/hr at 01/04/19 2259   levofloxacin (LEVAQUIN) IV     potassium chloride 10 mEq (01/05/19 1006)     LOS: 8 days    Time spent: 40 mins    Ramiro Harvest, MD [facility]  If 7PM-7AM, please contact night-coverage [email or web address] 01/05/2019, 10:40 AM

## 2019-01-05 NOTE — Progress Notes (Signed)
PHARMACY - ADULT TOTAL PARENTERAL NUTRITION CONSULT NOTE   Pharmacy Consult for TPN Indication: Severe pancreatitis  HPI: 34 yoM admitted on 3/14 with pancreatitis (precipitated by alcohol or hypertriglyceridemia).  Hypertriglyceridemia was treated with insulin drip.  Abdominal x-ray suggested early bowel obstruction.  NGT remains on suction.  Unable to tolerate clears.  Patient Measurements: Height 70" Filed Weights   01/03/19 0523 01/04/19 0511 01/05/19 0549  Weight: 178 lb 5.6 oz (80.9 kg) 172 lb 9.9 oz (78.3 kg) 170 lb 13.7 oz (77.5 kg)   Ideal body weight: 73 kg (160 lb 15 oz) Adjusted ideal body weight: 74.8 kg (164 lb 14.5 oz) Body mass index is 24.52 kg/m.   Significant events:  3/21 Reglan added.  Insulin requirements past 24 hours: none  Current Nutrition: NPO except for ice chips, sips, clear liquids from floorstock.  IVF: D5NS at 50 ml/hr  Central access: PICC line ordered, pending placement TPN start date: 3/23 if central access obtained.  ASSESSMENT                                                                                                          Today:   Glucose:  88-103  No hx DM.  A1c 4.9 % on 3/16.  Electrolytes:  Na low, K below goal 4, Cl low  Goal K > 4, Mag > 2  Renal:  SCr stable  LFTs:  WNL (3/21)  TGs:  375 (3/19)  Prealbumin:  13.1 (3/17)   NUTRITIONAL GOALS                                                                                             RD recs (pending) Kcal:  Protein:   Fluid:    RPh estimate, 3/22 Protein: 100 g /day (~1.3 g/kg/day) Kcal: 1925 (25 kcal/kg/day) - While holding lipids, aim to meet 100% protein goal and >80% of Kcal goal.   Custom TPN at goal rate of 83 ml/hr provides: - 100 g/day protein (50 g/L)   - 398 g/day Dextrose (20%)  - 1752 Kcal/day  (meets 91% of goal)   PLAN  Now: KCl per MD x4 runs. No other electrolyte replacement needed at this time.   At 1800 today: Unable to start TPN today d/t ordering cut off time of 12:00 PICC line was ordered, but RN thinks PICC team may not be able to place line today.  Tomorrow: Begin Custom TPN at 40 ml/hr NO lipids at this time (hold lipids per Dr. Chales Abrahams d/t elevated triglycerides) Elytes, Cl:Ac ratio Standard multivitamins and trace element daily. Plan to advance as tolerated to the goal rate. Reduce IVF to 10 ml/hr. Add CBGs and sensitive SSI q6h.  TPN lab panels on Mondays & Thursdays.   Lynann Beaver PharmD, BCPS Pager [phone] 01/05/2019 11:32 AM

## 2019-01-05 NOTE — Progress Notes (Signed)
MEWS Guidelines - (patients age 26 and over)  Red - At High Risk for Deterioration Yellow - At risk for Deterioration  1. Go to room and assess patient 2. Validate data. Is this patient's baseline? If data confirmed: 3. Is this an acute change? 4. Administer prn meds/treatments as ordered. 5. Note Sepsis score 6. Review goals of care 7. Radiation protection practitioner, RRT nurse and Provider. 8. Ask Provider to come to bedside.  9. Document patient condition/interventions/response. 10. Increase frequency of vital signs and focused assessments to at least q15 minutes x 4, then q30 minutes x2. - If stable, then q1h x3, then q4h x3 and then q8h or dept. routine. - If unstable, contact Provider & RRT nurse. Prepare for possible transfer. 11. Add entry in progress notes using the smart phrase ".MEWS". 1. Go to room and assess patient 2. Validate data. Is this patient's baseline? If data confirmed: 3. Is this an acute change? 4. Administer prn meds/treatments as ordered? 5. Note Sepsis score 6. Review goals of care 7. Radiation protection practitioner and Provider 8. Call RRT nurse as needed. 9. Document patient condition/interventions/response. 10. Increase frequency of vital signs and focused assessments to at least q2h x2. - If stable, then q4h x2 and then q8h or dept. routine. - If unstable, contact Provider & RRT nurse. Prepare for possible transfer. 11. Add entry in progress notes using the smart phrase ".MEWS".  Green - Likely stable Lavender - Comfort Care Only  1. Continue routine/ordered monitoring.  2. Review goals of care. 1. Continue routine/ordered monitoring. 2. Review goals of care.   Was notified  by Ave Filter RN this am that [facility] vital signs scored  a 5 RED Mews. Hopsitalist, C Bodenheimer and Rapid were notified of the Mews. Found this patient, Charles Dougherty resting quietly in bed with no acute distress noted. Medicated this patient for c/o abdominal pain. Please see flowsheets and  Mar. Will continue to monitor and assess patient needs and concerns.

## 2019-01-06 ENCOUNTER — Inpatient Hospital Stay (HOSPITAL_COMMUNITY): Payer: Federal, State, Local not specified - PPO

## 2019-01-06 LAB — COMPREHENSIVE METABOLIC PANEL
ALT: 27 U/L (ref 0–44)
AST: 30 U/L (ref 15–41)
Albumin: 2 g/dL — ABNORMAL LOW (ref 3.5–5.0)
Alkaline Phosphatase: 63 U/L (ref 38–126)
Anion gap: 10 (ref 5–15)
BUN: 9 mg/dL (ref 6–20)
CHLORIDE: 102 mmol/L (ref 98–111)
CO2: 21 mmol/L — ABNORMAL LOW (ref 22–32)
Calcium: 7.9 mg/dL — ABNORMAL LOW (ref 8.9–10.3)
Creatinine, Ser: 0.69 mg/dL (ref 0.61–1.24)
GFR calc Af Amer: >60 mL/min (ref >60)
GFR calc non Af Amer: >60 mL/min (ref >60)
Glucose, Bld: 94 mg/dL (ref 70–99)
POTASSIUM: 3.8 mmol/L (ref 3.5–5.1)
SODIUM: 133 mmol/L — AB (ref 135–145)
Total Bilirubin: 1.6 mg/dL — ABNORMAL HIGH (ref 0.3–1.2)
Total Protein: 5 g/dL — ABNORMAL LOW (ref 6.5–8.1)

## 2019-01-06 LAB — PROTIME-INR
INR: 1.3 — ABNORMAL HIGH (ref 0.8–1.2)
Prothrombin Time: 16.5 seconds — ABNORMAL HIGH (ref 11.4–15.2)

## 2019-01-06 LAB — DIFFERENTIAL
Abs Immature Granulocytes: 0.3 10*3/uL — ABNORMAL HIGH (ref 0.00–0.07)
Basophils Absolute: 0 10*3/uL (ref 0.0–0.1)
Basophils Relative: 0 %
Eosinophils Absolute: 0.1 10*3/uL (ref 0.0–0.5)
Eosinophils Relative: 1 %
Immature Granulocytes: 2 %
Lymphocytes Relative: 8 %
Lymphs Abs: 1 10*3/uL (ref 0.7–4.0)
Monocytes Absolute: 0.9 10*3/uL (ref 0.1–1.0)
Monocytes Relative: 7 %
NEUTROS PCT: 82 %
Neutro Abs: 11.1 10*3/uL — ABNORMAL HIGH (ref 1.7–7.7)

## 2019-01-06 LAB — GLUCOSE, CAPILLARY
Glucose-Capillary: 111 mg/dL — ABNORMAL HIGH (ref 70–99)
Glucose-Capillary: 71 mg/dL (ref 70–99)
Glucose-Capillary: 80 mg/dL (ref 70–99)
Glucose-Capillary: 89 mg/dL (ref 70–99)
Glucose-Capillary: 90 mg/dL (ref 70–99)
Glucose-Capillary: 94 mg/dL (ref 70–99)
Glucose-Capillary: 96 mg/dL (ref 70–99)

## 2019-01-06 LAB — PREALBUMIN: PREALBUMIN: 7 mg/dL — AB (ref 18–38)

## 2019-01-06 LAB — PHOSPHORUS: PHOSPHORUS: 4.2 mg/dL (ref 2.5–4.6)

## 2019-01-06 LAB — URINE CULTURE: Culture: NO GROWTH

## 2019-01-06 LAB — LACTATE DEHYDROGENASE: LDH: 474 U/L — ABNORMAL HIGH (ref 98–192)

## 2019-01-06 LAB — CBC
HCT: 24.1 % — ABNORMAL LOW (ref 39.0–52.0)
Hemoglobin: 7.5 g/dL — ABNORMAL LOW (ref 13.0–17.0)
MCH: 32.9 pg (ref 26.0–34.0)
MCHC: 31.1 g/dL (ref 30.0–36.0)
MCV: 105.7 fL — ABNORMAL HIGH (ref 80.0–100.0)
Platelets: 410 10*3/uL — ABNORMAL HIGH (ref 150–400)
RBC: 2.28 MIL/uL — ABNORMAL LOW (ref 4.22–5.81)
RDW: 13.7 % (ref 11.5–15.5)
WBC: 13.5 10*3/uL — ABNORMAL HIGH (ref 4.0–10.5)
nRBC: 0 % (ref 0.0–0.2)

## 2019-01-06 LAB — LIPASE, BLOOD: Lipase: 47 U/L (ref 11–51)

## 2019-01-06 LAB — MAGNESIUM: Magnesium: 2.1 mg/dL (ref 1.7–2.4)

## 2019-01-06 LAB — TRIGLYCERIDES: TRIGLYCERIDES: 260 mg/dL — AB (ref <150)

## 2019-01-06 MED ORDER — SODIUM CHLORIDE 0.9% FLUSH
10.0000 mL | Freq: Two times a day (BID) | INTRAVENOUS | Status: DC
  Administered 2019-01-06 – 2019-01-16 (×9): 10 mL

## 2019-01-06 MED ORDER — TRAVASOL 10 % IV SOLN
INTRAVENOUS | Status: AC
  Administered 2019-01-06: 18:00:00 via INTRAVENOUS
  Filled 2019-01-06: qty 600

## 2019-01-06 MED ORDER — SODIUM CHLORIDE 0.9% FLUSH
10.0000 mL | INTRAVENOUS | Status: DC | PRN
  Administered 2019-01-10 – 2019-01-14 (×3): 10 mL
  Filled 2019-01-06 (×3): qty 40

## 2019-01-06 MED ORDER — CHLORHEXIDINE GLUCONATE CLOTH 2 % EX PADS
6.0000 | MEDICATED_PAD | Freq: Every day | CUTANEOUS | Status: DC
  Administered 2019-01-09: 6 via TOPICAL

## 2019-01-06 MED ORDER — SODIUM CHLORIDE 0.9 % IV SOLN
1.0000 g | Freq: Three times a day (TID) | INTRAVENOUS | Status: DC
  Administered 2019-01-06 – 2019-01-11 (×15): 1 g via INTRAVENOUS
  Filled 2019-01-06 (×16): qty 1

## 2019-01-06 MED ORDER — DEXTROSE-NACL 5-0.9 % IV SOLN
INTRAVENOUS | Status: DC
  Administered 2019-01-06: 16:00:00 via INTRAVENOUS

## 2019-01-06 MED ORDER — SODIUM CHLORIDE 0.9 % IV SOLN
510.0000 mg | Freq: Once | INTRAVENOUS | Status: AC
  Administered 2019-01-06: 510 mg via INTRAVENOUS
  Filled 2019-01-06: qty 17

## 2019-01-06 MED ORDER — MORPHINE SULFATE (PF) 2 MG/ML IV SOLN
2.0000 mg | Freq: Once | INTRAVENOUS | Status: AC
  Administered 2019-01-06: 2 mg via INTRAVENOUS
  Filled 2019-01-06: qty 1

## 2019-01-06 MED ORDER — POTASSIUM CHLORIDE 10 MEQ/100ML IV SOLN
10.0000 meq | INTRAVENOUS | Status: AC
  Administered 2019-01-06 (×4): 10 meq via INTRAVENOUS
  Filled 2019-01-06 (×4): qty 100

## 2019-01-06 MED ORDER — ALBUMIN HUMAN 25 % IV SOLN
25.0000 g | Freq: Four times a day (QID) | INTRAVENOUS | Status: AC
  Administered 2019-01-06 – 2019-01-07 (×4): 25 g via INTRAVENOUS
  Filled 2019-01-06: qty 50
  Filled 2019-01-06: qty 100
  Filled 2019-01-06: qty 50
  Filled 2019-01-06 (×2): qty 100

## 2019-01-06 MED ORDER — SODIUM CHLORIDE 0.9 % IV BOLUS
1000.0000 mL | Freq: Once | INTRAVENOUS | Status: AC
  Administered 2019-01-06: 1000 mL via INTRAVENOUS

## 2019-01-06 MED ORDER — INSULIN ASPART 100 UNIT/ML ~~LOC~~ SOLN: 0.0000 [IU] | Freq: Four times a day (QID) | SUBCUTANEOUS | Status: DC

## 2019-01-06 NOTE — Progress Notes (Signed)
Initial Nutrition Assessment  INTERVENTION:   Monitor magnesium, potassium, and phosphorus daily for at least 3 days, MD to replete as needed, as pt is at risk for refeeding syndrome given poor PO intakes for at least 8 days, ETOH use PTA.  TPN per Pharmacy  NUTRITION DIAGNOSIS:   Increased nutrient needs related to (ETOH pancreatitis) as evidenced by estimated needs.  GOAL:   Patient will meet greater than or equal to 90% of their needs  MONITOR:   PO intake, Labs, Weight trends, I & O's(TPN)  REASON FOR ASSESSMENT:   Consult New TPN/TNA  ASSESSMENT:   28 year old male with history of alcohol abuse, substance abuse, smoker who presents to the emergency department with complaints of abdominal pain. Admitted for the management of acute pancreatitis related to alcohol.    Patient about to be moved to ICU. Has been NPO since admission on 3/14. Pt with NGT set to suction, output: 200 ml. Pt was having N/V and abdominal pain PTA, N/V has since ceased. Pt was drinking ETOH daily. At admission, CT scan revealed inflammation of the hepatic flexure of the colon, possible ileus/obstruction.  PICC placed today, TPN to begin today.   Per weight records, UBW is ~155 lb. Pt's weight has increased over this admission, most likely d/t fluid accumulation given NPO status.  Medications: IV Reglan every 8 hours, D5 -.9% NaCl infusion at 100, IV KCl, IV Zofran PRN Labs reviewed:  Low Na Mg/Phos WNL TG: 260 mg/dL  NUTRITION - FOCUSED PHYSICAL EXAM:  Nutrition focused physical exam shows no sign of depletion of muscle mass or body fat.  Diet Order:   Diet Order            Diet NPO time specified Except for: Ice Chips, Sips with Meds  Diet effective now              EDUCATION NEEDS:   No education needs have been identified at this time  Skin:  Skin Assessment: Reviewed RN Assessment  Last BM:  3/22  Height:   Ht Readings from Last 1 Encounters:  12/30/18 5\' 10"  (1.778 m)     Weight:   Wt Readings from Last 1 Encounters:  01/06/19 77.3 kg    Ideal Body Weight:  75.5 kg  BMI:  Body mass index is 24.45 kg/m.  Estimated Nutritional Needs:   Kcal:  2100-2300  Protein:  100-110g  Fluid:  2.1L/day  Tilda Franco, MS, RD, LDN [facility] Inpatient Clinical Dietitian Pager: [phone] After Hours Pager: [phone]

## 2019-01-06 NOTE — Progress Notes (Signed)
Peripherally Inserted Central Catheter/Midline Placement  The IV Nurse has discussed with the patient and/or persons authorized to consent for the patient, the purpose of this procedure and the potential benefits and risks involved with this procedure.  The benefits include less needle sticks, lab draws from the catheter, and the patient may be discharged home with the catheter. Risks include, but not limited to, infection, bleeding, blood clot (thrombus formation), and puncture of an artery; nerve damage and irregular heartbeat and possibility to perform a PICC exchange if needed/ordered by physician.  Alternatives to this procedure were also discussed.  Bard Power PICC patient education guide, fact sheet on infection prevention and patient information card has been provided to patient /or left at bedside.    PICC/Midline Placement Documentation  PICC Double Lumen 01/06/19 PICC Left Basilic 41 cm 0 cm (Active)  Indication for Insertion or Continuance of Line Administration of hyperosmolar/irritating solutions (i.e. TPN, Vancomycin, etc.) 01/06/2019  1:34 PM  Exposed Catheter (cm) 0 cm 01/06/2019  1:34 PM  Site Assessment Clean;Dry;Intact 01/06/2019  1:34 PM  Lumen #1 Status Flushed;Blood return noted;Saline locked 01/06/2019  1:34 PM  Lumen #2 Status Flushed;Blood return noted;Saline locked 01/06/2019  1:34 PM  Dressing Type Transparent 01/06/2019  1:34 PM  Dressing Status Dry;Clean;Intact 01/06/2019  1:34 PM  Dressing Change Due 01/13/19 01/06/2019  1:34 PM       Audrie Gallus 01/06/2019, 1:37 PM

## 2019-01-06 NOTE — Progress Notes (Signed)
Report called at 1030.

## 2019-01-06 NOTE — Progress Notes (Signed)
PROGRESS NOTE    Charles Dougherty  WUJ:[id number] DOB: [birth year] DOA: 12/27/2018 PCP: Patient, No Pcp Per    Brief Narrative:  Patient is a 28 year old male with history of alcohol abuse, substance abuse, smoker who presents to the emergencydepartment with complaints of abdominal pain. Admitted for the management of acute pancreatitis related to alcohol. Incidentally found to have severe hypertriglyceridemia.He has positive family history.  Hospital course remarkable for abdominal discomfort, distention .Abdominal x-ray suggested early bowel obstruction.  General surgery consulted and he was started on conservative management with NG tube decompression, n.p.o. status. Unclear if the pancreatitis was precipitated by alcohol or hypertriglyceridemia.  Due to severe hypertriglyceridemia ,we started on insulin drip and moved to stepdown.  GI also consulted.   Assessment & Plan:   Principal Problem:   Acute alcoholic pancreatitis Active Problems:   Hypertriglyceridemia   Pancreatitis   ETOH abuse   Drug abuse (HCC)   Cystitis   Ileus (HCC)   Abdominal pain   SBO (small bowel obstruction) (HCC)   Hypophosphatemia   Hypomagnesemia   Hypocalcemia   AKI (acute kidney injury) (HCC)   Anemia   Fever   Pneumonia of left lower lobe due to infectious organism [facility])  1 acute pancreatitis secondary to alcohol and hypertriglyceridemia Patient had presented with abdominal pain nausea and vomiting noted to have significantly elevated lipase levels and transaminitis on admission.  Patient noted to be every day drinker usually drinks beer.  Patient placed on conservative management with IV fluids, n.p.o. and pain management.  CT abdomen and pelvis which was done early on during the hospitalization, did show infiltration and edema around the head and body of the pancreas consistent with acute edematous interstitial pancreatitis.  No loculated fluid collections, no evidence of pancreatic necrosis.  Patient  currently with NG tube in place states some improvement with abdominal pain.  Blood pressure slightly elevated likely secondary to current pain.  IV fluids have been decreased to 50 cc/h.  NG tube was to be discontinued 01/01/2019, and was clamped however patient unable to tolerate and as such NG tube was placed back to suction.  NG tube with output of 950 cc over the past 24 hours.  Patient with clamping trial and trial of clear liquids on 01/03/2019 however patient was reluctant at that time.  Patient reluctant currently.  Patient noted to have fever of 101 this morning and 101.5 the morning of 01/05/2019.  Patient now tachycardic.  Prealbumin of 7.0.  LDH of 474.  Hemoglobin down to 7.5 from 8.0 from 8.5 from 9.2 from 13.2 on 12/31/2018.  CT abdomen and pelvis (01/05/2019)with new large amount of free fluid and phlegmonous/inflammatory material surrounding the pancreas extending into bilateral paracolic gutters and pelvis.  Slightly higher density fluid is seen about the pancreatic body and tail raising possibility of some intermixed blood products.  No circumscribed fluid collection or abscess-like collection identified.  New bibasilar pleural effusions, left greater than right least moderate in size on the left.  With associated compressive atelectasis incompletely imaged.  Gaseous distention of proximal jejunum, presumably associated with ileus.  Patient also placed on Levaquin.  May have ice chips.  TPN has been ordered and awaiting PICC line to be placed.  Blood cultures negative to date x1 day.  Patient's condition seems to be deteriorating and worsening and as such we will transfer back to the stepdown unit for closer monitoring.  GI following.   2.  Severe hypertriglyceridemia Patient with family history of hypertriglyceridemia.  Patient's uncle noted to have this.  Patient noted to have triglycerides levels greater than 5000 with repeat labs confirming it.  Patient was placed on the insulin drip with  significant improvement with elevated triglyceride levels.  Triglycerides at 260 today 01/06/2019.  Insulin drip has been discontinued. Will need to be on the fenofibrate on discharge.  Will need outpatient follow-up with PCP and probable referral to endocrinology.  Alcohol cessation has been stressed to patient.  3.  Small bowel obstruction versus ileus secondary to pancreatitis Patient with 950cc output from NG tube over the past 24 hours.  Abdominal films with no significant change with dilated loops of bowel.  Patient not passing flatus today.  No bowel movement today. Patient states had bowel movements x2 yesterday.  Patient with some right upper quadrant abdominal pain. Abdominal films with no significant improvement.  Clamping trial was to be done with a trial of clear liquids on 01/03/2019 however patient was reluctant and clamping trial was not done.  Patient still with dilated bowels of loop seen on abdominal film.  Continue simethicone 160 mg p.o. 4 times daily.  Patient started on IV Reglan per gastroenterology.  Leave NG tube to suction per GI.  CT abdomen and pelvis with new large amount of free fluid and phlegmonous/inflammatory material surrounding the pancreas extending into bilateral paracolic gutters and pelvis.  Slightly higher density fluid is seen about the pancreatic body and tail raising possibility of some intermixed blood products.  No circumscribed fluid collection or abscess-like collection identified.  New bibasilar pleural effusions, left greater than right least moderate in size on the left.  With associated compressive atelectasis incompletely imaged.  Gaseous distention of proximal jejunum, presumably associated with ileus.  Patient still with fevers.  No significant improvement with ileus.  Keep n.p.o.  Mobilize.  TPN ordered and awaiting PICC line to be placed.  Blood cultures ordered with no growth to date.  Per GI.    5.  Left lower lobe atelectasis/consolidation Per chest  x-ray.  Patient noted to have a temp of 101 early this morning.  Patient with a worsening leukocytosis.  Blood cultures pending with no growth to date.  Urine strep pneumococcus antigen negative.  Urine Legionella antigen pending.  Patient started on IV Levaquin per GI on 01/04/2019 and dose increased to Levaquin 750 mg daily to cover for probable pneumonia.  Continue supportive care.   6.  Fever Patient with a temperature 101 this morning.  Likely secondary to problem worsening pancreatitis and #5.  Blood cultures pending with no growth to date.  Blood cultures ordered and pending.  Urinalysis nitrite negative.  Urine strep pneumococcus antigen is negative.  Urine Legionella antigen pending.  Chest x-ray with left lower lobe atelectasis versus consolidation.  Patient was started on IV Levaquin per GI on 01/04/2019 which we will continue for now.  7.  Hypophosphatemia/hypokalemia Repleted.  Phosphorus level at 4.2.   8.  Anemia Likely dilutional versus secondary to worsening pancreatitis.  Patient with no overt bleeding.  Anemia panel consistent with anemia of chronic disease/iron deficiency anemia.  Iron level at 32.  Ferritin elevated at 1383.  Hemoglobin currently at 7.5 from 8.0 from 8.3 from 7.1 from 8.9 from 13.2.  FOBT negative.  Status post 1 unit packed red blood cells 01/02/2019.  Continue PPI.  May need a dose of IV Feraheme however will defer to GI.  Per GI upper endoscopy to be done in the outpatient setting.  GI following.  9.  Hypocalcemia/hypoalbuminemia Hypocalcemia associated likely with pancreatitis.  Patient also noted to have a severe hypoalbuminemia of 1.9.  Corrected calcium at 9.32.  Patient given some doses of IV calcium gluconate early on during the hospitalization.  TPN ordered per GI and awaiting PICC line to be placed.  Follow.   10.  Transaminitis Images done did not show any gallbladder stones or inflammation.  Patient noted to have diffuse fatty liver.  LFTs trended down.   Follow.    11.  Thrombocytopenia Likely secondary to alcoholic liver disease.  Lovenox discontinued.  No overt bleeding.  Platelet count improving currently at 410.  Follow.  12.  Suspected cystitis Per CT imaging.  Patient currently asymptomatic.  Urinalysis unremarkable.   13.  Acute kidney injury Resolved with hydration.  Follow.   14.  Hypoglycemic events Felt secondary to insulin drip.  Patient also noted to be n.p.o.  Insulin drip has been discontinued.  CBG 90 this morning.  Increase IV fluids of D5 normal saline 100 cc/h.  Follow.   15 alcohol abuse No signs of withdrawal.  Alcohol cessation stressed to patient.  Social work consulted.  Follow.  16.  Malnutrition Patient presented with acute pancreatitis.  Has essentially been n.p.o. since admission.  Prealbumin of 7.0.  TPN ordered.  Awaiting PICC line to be placed.  May need some IV albumin however will defer to GI.   DVT prophylaxis: SCDs, ambulation Code Status: Full Family Communication: Updated patient.  No family at bedside. Disposition Plan: Transfer back to stepdown unit.    Consultants:   General surgery: Dr.Tsuei 12/29/2018  Gastroenterology: Dr. Barron Alvineirigliano 12/30/2018  Procedures:   CT abdomen and pelvis 12/28/2018  Right upper quadrant ultrasound 12/27/2018  Abdominal films 12/29/2018, 12/30/2018, 12/31/2018, 01/01/2019, 01/02/2019, 01/03/2019, 01/04/2019, 01/06/2019  Transfused 1 unit packed red blood cells 01/02/2019  Acute abdominal series 01/05/2019  CT abdomen and pelvis 01/05/2019  Antimicrobials:   IV Levaquin 01/04/2019   Subjective: Patient states feeling somewhat clammy.  Patient with a fever of 101 earlier this morning.  Patient with some complaints of right sided upper abdominal pain.  No flatus.  No bowel movement today.  Had 2 bowel movements yesterday.  No nausea or vomiting.  Feels he is not improving.  Asking for NG tube to be placed back to suction.    Objective: Vitals:   01/06/19 0330  01/06/19 0500 01/06/19 0543 01/06/19 0746  BP: (!) 149/93  (!) 142/91 138/85  Pulse: (!) 115  (!) 121 (!) 124  Resp: 18  18 20   Temp: (!) 100.8 F (38.2 C)  (!) 101 F (38.3 C) 98.4 F (36.9 C)  TempSrc: Oral  Oral Oral  SpO2: 95%  94% 93%  Weight:  77.3 kg    Height:        Intake/Output Summary (Last 24 hours) at 01/06/2019 0939 Last data filed at 01/06/2019 0933 Gross per 24 hour  Intake 1218.55 ml  Output 2250 ml  Net -1031.45 ml   Filed Weights   01/04/19 0511 01/05/19 0549 01/06/19 0500  Weight: 78.3 kg 77.5 kg 77.3 kg    Examination:  General exam: NG tube in place.  Respiratory system: Decreased breath sounds on the left otherwise clear.  No crackles.  No wheezing.  No rhonchi.  Normal respiratory effort.  Cardiovascular system: Tachycardia.  No murmurs rubs or gallops.  No JVD.  No lower extremity edema.  Gastrointestinal system: Abdomen is less distended, hypoactive bowel sounds, some tenderness to palpation right upper quadrant,  no rebound, no guarding.  Central nervous system: Alert and oriented. No focal neurological deficits. Extremities: Symmetric 5 x 5 power. Skin: No rashes, lesions or ulcers Psychiatry: Judgement and insight appear normal. Mood & affect appropriate.     Data Reviewed: I have personally reviewed following labs and imaging studies  CBC: Recent Labs  Lab 12/31/18 0525 01/01/19 0550 01/02/19 0951 01/02/19 1928 01/03/19 0320 01/04/19 0518 01/05/19 0548 01/06/19 0503  WBC 4.2 7.9 10.0  --  12.5* 13.3* 14.8* 13.5*  NEUTROABS 2.9 6.0 8.1*  --   --  11.4*  --  11.1*  HGB 13.2 8.9* 7.1* 9.2* 8.3* 8.5* 8.0* 7.5*  HCT 40.7 27.1* 21.9* 27.7* 24.9* 26.3* 23.8* 24.1*  MCV 102.8* 101.1* 102.3*  --  98.0 101.9* 102.6* 105.7*  PLT 53* 83* 109*  --  134* 213 316 410*   Basic Metabolic Panel: Recent Labs  Lab 12/31/18 1603 01/01/19 0550  01/02/19 0622 01/03/19 0320 01/04/19 0518 01/05/19 0548 01/06/19 0503  NA 130* 131*   < > 135 135  133* 134* 133*  K 3.6 4.3   < > 3.8 3.9 3.8 3.9 3.8  CL 101 102   < > 103 104 100 102 102  CO2 21* 23   < > 21* 21*  GLUCOSE 61* 125*   < > 118* 106* 102* 96 94  BUN 8 7   < > CREATININE 0.63 0.69   < > 0.61 0.63 0.65 0.70 0.69  CALCIUM 6.6* 6.9*   < > 7.4* 7.8* 7.9* 7.8* 7.9*  MG 2.1 2.0  --   --  2.2  --  2.1 2.1  PHOS 2.0* 2.4*  --  2.8 3.8  --   --  4.2   < > = values in this interval not displayed.   GFR: Estimated Creatinine Clearance: 145.7 mL/min (by C-G formula based on SCr of 0.69 mg/dL). Liver Function Tests: Recent Labs  Lab 01/01/19 0550 01/02/19 0622 01/03/19 0320 01/04/19 0518 01/06/19 0503  AST 72* 53* 50* 35 30  ALT 47* 41 40 31 27  ALKPHOS 53 57 71 72 63  BILITOT 2.8* 1.8* 1.9* 1.6* 1.6*  PROT 4.8* 4.8* 5.1* 5.2* 5.0*  ALBUMIN 2.1* 2.0* 2.1* 2.1* 2.0*   Recent Labs  Lab 12/30/18 1532 01/02/19 0622 01/06/19 0503  LIPASE 142* 67* 47   No results for input(s): AMMONIA in the last 168 hours. Coagulation Profile: Recent Labs  Lab 01/06/19 0503  INR 1.3*   Cardiac Enzymes: No results for input(s): CKTOTAL, CKMB, CKMBINDEX, TROPONINI in the last 168 hours. BNP (last 3 results) No results for input(s): PROBNP in the last 8760 hours. HbA1C: No results for input(s): HGBA1C in the last 72 hours. CBG: Recent Labs  Lab 01/05/19 1637 01/05/19 2015 01/06/19 0036 01/06/19 0540 01/06/19 0739  GLUCAP 92 78 96 80 90   Lipid Profile: Recent Labs    01/06/19 0503  TRIG 260*   Thyroid Function Tests: No results for input(s): TSH, T4TOTAL, FREET4, T3FREE, THYROIDAB in the last 72 hours. Anemia Panel: No results for input(s): VITAMINB12, FOLATE, FERRITIN, TIBC, IRON, RETICCTPCT in the last 72 hours. Sepsis Labs: No results for input(s): PROCALCITON, LATICACIDVEN in the last 168 hours.  Recent Results (from the past 240 hour(s))  Urine culture     Status: None   Collection Time: 12/28/18  5:04 AM  Result Value Ref Range Status     Specimen Description  Final    URINE, RANDOM Performed at [facility], [address], [place], [place] [address]    Special Requests   Final    NONE Performed at [facility], [address] [place], [place] [address]    Culture   Final    NO GROWTH Performed at [facility], [address], [place], [place] [address]    Report Status 12/29/2018 FINAL  Final  MRSA PCR Screening     Status: None   Collection Time: 12/30/18 11:24 AM  Result Value Ref Range Status   MRSA by PCR NEGATIVE NEGATIVE Final    Comment:        The GeneXpert MRSA Assay (FDA approved for NASAL specimens only), is one component of a comprehensive MRSA colonization surveillance program. It is not intended to diagnose MRSA infection nor to guide or monitor treatment for MRSA infections. Performed at [facility], [address], [place], [place] [address]   Culture, blood (Routine X 2) w Reflex to ID Panel     Status: None (Preliminary result)   Collection Time: 01/05/19  9:05 AM  Result Value Ref Range Status   Specimen Description   Final    BLOOD RIGHT HAND Performed at [facility], [address], [place], [place] [address]    Special Requests   Final    BOTTLES DRAWN AEROBIC ONLY Blood Culture adequate volume Performed at [facility], [address] [place], [place] [address]    Culture   Final    NO GROWTH < 12 HOURS Performed at [facility], [address], [place], [place] [address]    Report Status PENDING  Incomplete  Culture, blood (Routine X 2) w Reflex to ID Panel     Status: None (Preliminary result)   Collection Time: 01/05/19  9:05 AM  Result Value Ref Range Status   Specimen Description   Final    BLOOD LEFT HAND Performed at [facility], [address], [place], [place] [address]    Special Requests   Final    BOTTLES DRAWN AEROBIC ONLY Blood  Culture adequate volume Performed at [facility], [address], [place], [place] [address]    Culture   Final    NO GROWTH < 12 HOURS Performed at [facility], [address], [place], [place] [address]    Report Status PENDING  Incomplete         Radiology Studies: Ct Abdomen Pelvis W Contrast  Result Date: 01/05/2019 CLINICAL DATA:  Abdominal pain and distension. Pancreatitis. EXAM: CT ABDOMEN AND PELVIS WITH CONTRAST TECHNIQUE: Multidetector CT imaging of the abdomen and pelvis was performed using the standard protocol following bolus administration of intravenous contrast. CONTRAST:  OMNIPAQUE IOHEXOL 300 MG/ML SOLN, 30mL OMNIPAQUE IOHEXOL 300 MG/ML SOLN COMPARISON:  CT abdomen dated 12/27/2018. FINDINGS: Lower chest: New bibasilar pleural effusions, LEFT greater than RIGHT, with associated compressive atelectasis, incompletely imaged. Hepatobiliary: No focal liver abnormality is seen. No gallstones, gallbladder wall thickening, or biliary dilatation. Pancreas: There is now a large amount of free fluid and phlegmonous/inflammatory material surrounding the pancreas with extension into the bilateral paracolic gutters and pelvis. Slightly higher density fluid is seen about the pancreatic body and tail raising the possibility of some intermixed blood products. Spleen: Normal in size without focal abnormality. Adrenals/Urinary Tract: Adrenal glands appear normal. Kidneys are unremarkable without mass, stone or hydronephrosis. Bladder  is unremarkable, partially decompressed. Stomach/Bowel: Enteric tube appears well positioned in the stomach. Probable reactive thickening of the walls of the stomach and duodenum. Gaseous distention of the proximal jejunum, distended to approximately 5 cm diameter, presumed associated ileus. Remainder of the large and small bowel is of normal caliber. Vascular/Lymphatic: No significant vascular findings are present. No enlarged  abdominal or pelvic lymph nodes. Reproductive: Prostate is unremarkable. Other: No circumscribed fluid collection or abscess like collection identified at this time. No pseudocyst formation at this time. No free intraperitoneal air. Musculoskeletal: No acute or suspicious osseous finding. IMPRESSION: 1. New large amount of free fluid and phlegmonous/inflammatory material surrounding the pancreas, extending into the bilateral paracolic gutters and pelvis. Slightly higher density fluid is seen about the pancreatic body and tail raising the possibility of some intermixed blood products. No circumscribed fluid collection or abscess like collection identified at this time. 2. New bibasilar pleural effusions, left greater than right (at least moderate in size on the LEFT), with associated compressive atelectasis, incompletely imaged. 3. Gaseous distention of the proximal jejunum, presumably associated ileus. Electronically Signed   By: Bary Richard M.D.   On: 01/05/2019 13:31   Dg Abd 2 Views  Result Date: 01/06/2019 CLINICAL DATA:  Ileus, enteric tube in place EXAM: ABDOMEN - 2 VIEW COMPARISON:  01/05/2019 CT abdomen/pelvis and abdominal radiographs. FINDINGS: Enteric tube terminates in the body of the stomach. Marked diffuse small bowel dilatation up to 7.7 cm diameter, with air-fluid levels, not substantially changed. Retained oral contrast is noted throughout nondistended large bowel. No evidence of pneumatosis or pneumoperitoneum. Small bilateral pleural effusions, left greater than right, and bibasilar lung opacities appear unchanged. No radiopaque nephrolithiasis. IMPRESSION: No change in marked diffuse small bowel dilatation with air-fluid levels. Retained oral contrast throughout the nondistended large bowel. Findings are compatible with persistent severe adynamic ileus of the small bowel versus partial distal small bowel obstruction. Small bilateral pleural effusions, left greater than right, with bibasilar  lung opacities, unchanged. Electronically Signed   By: Delbert Phenix M.D.   On: 01/06/2019 08:36   Dg Abd Acute 2+v W 1v Chest  Result Date: 01/05/2019 CLINICAL DATA:  Ileus, NG tube, Abdomen pain/swelling EXAM: DG ABDOMEN ACUTE W/ 1V CHEST COMPARISON:  the previous day's exam, and earlier studies FINDINGS: Heart size normal. Left pleural effusion, and consolidation/atelectasis at the left lung base, which have developed since CT 12/27/2018. Nasogastric tube to the decompressed stomach. Dilated small bowel loops with fluid levels, similar in number and degree of dilatation since prior study. Colon decompressed. Left pelvic phlebolith. Regional bones unremarkable. IMPRESSION: 1. Nasogastric tube to the stomach. 2. Persistent small bowel obstruction. 3. Left lower lung atelectasis/consolidation with effusion Electronically Signed   By: Corlis Leak M.D.   On: 01/05/2019 08:59   [place] Ekg Site Rite  Result Date: 01/05/2019 If Site Rite image not attached, placement could not be confirmed due to current cardiac rhythm.       Scheduled Meds:  Chlorhexidine Gluconate Cloth  6 each Topical Daily   metoCLOPramide (REGLAN) injection  10 mg Intravenous Q8H   nicotine  14 mg Transdermal Daily   pantoprazole (PROTONIX) IV  40 mg Intravenous Q12H   simethicone  160 mg Oral QID   Continuous Infusions:  sodium chloride Stopped (01/02/19 1042)   dextrose 5 % and 0.9% NaCl 100 mL/hr at 01/06/19 0801   levofloxacin (LEVAQUIN) IV Stopped (01/05/19 1406)   potassium chloride       LOS: 9 days  Time spent: 40 mins    Ramiro Harvest, MD [facility]  If 7PM-7AM, please contact night-coverage [email or web address] 01/06/2019, 9:39 AM

## 2019-01-06 NOTE — Progress Notes (Signed)
Pharmacy Antibiotic Note  Charles Dougherty is a 28 y.o. male admitted on 12/27/2018 with pancreatitis, now worsening. Antibiotics broadened to Meropenem. Pharmacy has been consulted to assist with dosing.  Plan: Meropenem 1g IV q8h Monitor renal function, cultures, clinical course.    Height: 5\' 10"  (177.8 cm) Weight: 170 lb 6.7 oz (77.3 kg) IBW/kg (Calculated) : 73  Temp (24hrs), Avg:99.8 F (37.7 C), Min:98.2 F (36.8 C), Max:101.5 F (38.6 C)  Recent Labs  Lab 01/02/19 0622 01/02/19 0951 01/03/19 0320 01/04/19 0518 01/05/19 0548 01/06/19 0503  WBC  --  10.0 12.5* 13.3* 14.8* 13.5*  CREATININE 0.61  --  0.63 0.65 0.70 0.69    Estimated Creatinine Clearance: 145.7 mL/min (by C-G formula based on SCr of 0.69 mg/dL).    Allergies  Allergen Reactions  . Shellfish-Derived Products Nausea And Vomiting    *Mussels*    Antimicrobials this admission: 3/21 Levofloxacin >> 3/23 3/23 Meropenem >>   Dose adjustments this admission: --  Microbiology results: 3/14 UCx: NGF 3/16 MRSA PCR: negative  3/22 BCx: NGTD 3/22 UCx: NGF   Thank you for allowing pharmacy to be a part of this patient's care.   Greer Pickerel, PharmD, BCPS Pager: [phone] 01/06/2019 5:26 PM

## 2019-01-06 NOTE — Progress Notes (Signed)
Instructed patient's nurse to removed PIV in R arm d/t PICC line in R arm. VU. Tomasita Morrow, RN VAST

## 2019-01-06 NOTE — Progress Notes (Addendum)
PHARMACY - ADULT TOTAL PARENTERAL NUTRITION CONSULT NOTE   Pharmacy Consult for TPN Indication: Severe pancreatitis  HPI: 30 yoM admitted on 3/14 with pancreatitis (precipitated by alcohol or hypertriglyceridemia).  Hypertriglyceridemia was treated with insulin drip.  Abdominal x-ray suggested early bowel obstruction.  NGT remains on suction.  Unable to tolerate clears.  Patient Measurements: Height 70" Filed Weights   01/04/19 0511 01/05/19 0549 01/06/19 0500  Weight: 172 lb 9.9 oz (78.3 kg) 170 lb 13.7 oz (77.5 kg) 170 lb 6.7 oz (77.3 kg)   Ideal body weight: 73 kg (160 lb 15 oz) Adjusted ideal body weight: 74.7 kg (164 lb 11.6 oz) Body mass index is 24.45 kg/m.  Significant events:  3/21 Reglan added. 3/22: Unable to start TPN today d/t ordering cut off time of 12:00, PICC line was ordered, but not placed, concern with leukocytosis 3/23: PICC line placed  Insulin requirements past 24 hours: none  Current Nutrition: NPO except for ice chips, sips, clear liquids from floorstock.  IVF: D5NS at 100 ml/hr  Central access: PICC line ordered, pending placement TPN start date: 3/23   ASSESSMENT                                                                                                          Today:   Glucose:  TPN not started  No hx DM.  A1c 4.9 % on 3/16.  Electrolytes:  Na, K low, Cl & CO2 low end  Mag and Phos wnl (Phos high end of normal)  Goal K > 4, Mag > 2  Renal:  SCr stable  LFTs:  WNL (3/21)  TGs:  375 (3/19), 260 (3/23)  Prealbumin:  13.1 (3/17)   NUTRITIONAL GOALS                                                                                             RD recs (pending) Kcal:  Protein:   Fluid:    RPh estimate, 3/22 Protein: 100 g /day (~1.3 g/kg/day) Kcal: 1925 (25 kcal/kg/day) - While holding lipids, aim to meet 100% protein goal and >80% of Kcal goal.   Custom TPN at goal rate of 83 ml/hr provides: - 100 g/day protein (50 g/L)   -  398 g/day Dextrose (20%)  - 1752 Kcal/day  (meets 91% of goal)   PLAN  At 1800 today: Begin Custom TPN at 50 ml/hr NO lipids at this time (hold lipids per Dr. Chales Abrahams d/t elevated triglycerides) Elytes: Increased Na, K above normal, decreased Phos, Ca/Mag no change  Cl:Ac ratio start with 1:1 Standard multivitamins and trace element daily. Plan to advance as tolerated to the goal rate. Reduce IVF to 50 ml/hr. Add CBGs and sensitive SSI q6h.  TPN lab panels on Mondays & Thursdays BMET, Mag & Phos in am  Otho Bellows PharmD Pager [phone] 01/06/2019, 2:10 PM

## 2019-01-06 NOTE — Progress Notes (Addendum)
[facility] Progress Note  CC:  EtOH pancreatitis + hypertriglyceridemia, ileus   Subjective: Increased RUQ pain, passed 2 liquid BMs yesterday. No N/V   Objective:  Vital signs in last 24 hours: Temp:  [98.3 F (36.8 C)-101.5 F (38.6 C)] 98.4 F (36.9 C) (03/23 0746) Pulse Rate:  [110-128] 124 (03/23 0746) Resp:  [17-20] 20 (03/23 0746) BP: (138-149)/(85-100) 138/85 (03/23 0746) SpO2:  [92 %-97 %] 93 % (03/23 0746) Weight:  [77.3 kg] 77.3 kg (03/23 0500) Last BM Date: 01/04/19 General:   Alert,  Well-developed,    in NAD Heart:  Tachycardic, no murmur Pulm: clear, decreased bases  Abdomen:  Mild distension, soft, RUQ tenderness without  Rebound or guarding, few tinkering BS to the lower quadrants. NGT to LIS draining dark green bilious drainage. Extremities:  Without edema. Neurologic:  Alert and  oriented x4;  grossly normal neurologically. Psych:  Alert and cooperative. Normal mood and affect.  Intake/Output from previous day: 03/22 0701 - 03/23 0700 In: 1118.6 [I.V.:968.6; IV Piggyback:150] Out: 1850 [Urine:900; Emesis/NG output:950] Intake/Output this shift: Total I/O In: 100 [P.O.:100] Out: 200 [Urine:200]  Lab Results: Recent Labs    01/04/19 0518 01/05/19 0548 01/06/19 0503  WBC 13.3* 14.8* 13.5*  HGB 8.5* 8.0* 7.5*  HCT 26.3* 23.8* 24.1*  PLT 213 316 410*   BMET Recent Labs    01/04/19 0518 01/05/19 0548 01/06/19 0503  NA 133* 134* 133*  K 3.8 3.9 3.8  CL 100 102 102  CO2 23 21* 21*  GLUCOSE 102* 96 94  BUN 7 10 9   CREATININE 0.65 0.70 0.69  CALCIUM 7.9* 7.8* 7.9*   LFT Recent Labs    01/06/19 0503  PROT 5.0*  ALBUMIN 2.0*  AST 30  ALT 27  ALKPHOS 63  BILITOT 1.6*   PT/INR Recent Labs    01/06/19 0503  LABPROT 16.5*  INR 1.3*   Hepatitis Panel No results for input(s): HEPBSAG, HCVAB, HEPAIGM, HEPBIGM in the last 72 hours.  Ct Abdomen Pelvis W Contrast  Result Date: 01/05/2019 CLINICAL DATA:  Abdominal  pain and distension. Pancreatitis. EXAM: CT ABDOMEN AND PELVIS WITH CONTRAST TECHNIQUE: Multidetector CT imaging of the abdomen and pelvis was performed using the standard protocol following bolus administration of intravenous contrast. CONTRAST:  OMNIPAQUE IOHEXOL 300 MG/ML SOLN, 62mL OMNIPAQUE IOHEXOL 300 MG/ML SOLN COMPARISON:  CT abdomen dated 12/27/2018. FINDINGS: Lower chest: New bibasilar pleural effusions, LEFT greater than RIGHT, with associated compressive atelectasis, incompletely imaged. Hepatobiliary: No focal liver abnormality is seen. No gallstones, gallbladder wall thickening, or biliary dilatation. Pancreas: There is now a large amount of free fluid and phlegmonous/inflammatory material surrounding the pancreas with extension into the bilateral paracolic gutters and pelvis. Slightly higher density fluid is seen about the pancreatic body and tail raising the possibility of some intermixed blood products. Spleen: Normal in size without focal abnormality. Adrenals/Urinary Tract: Adrenal glands appear normal. Kidneys are unremarkable without mass, stone or hydronephrosis. Bladder is unremarkable, partially decompressed. Stomach/Bowel: Enteric tube appears well positioned in the stomach. Probable reactive thickening of the walls of the stomach and duodenum. Gaseous distention of the proximal jejunum, distended to approximately 5 cm diameter, presumed associated ileus. Remainder of the large and small bowel is of normal caliber. Vascular/Lymphatic: No significant vascular findings are present. No enlarged abdominal or pelvic lymph nodes. Reproductive: Prostate is unremarkable. Other: No circumscribed fluid collection or abscess like collection identified at this time. No pseudocyst formation at this time. No free intraperitoneal  air. Musculoskeletal: No acute or suspicious osseous finding. IMPRESSION: 1. New large amount of free fluid and phlegmonous/inflammatory material surrounding the pancreas,  extending into the bilateral paracolic gutters and pelvis. Slightly higher density fluid is seen about the pancreatic body and tail raising the possibility of some intermixed blood products. No circumscribed fluid collection or abscess like collection identified at this time. 2. New bibasilar pleural effusions, left greater than right (at least moderate in size on the LEFT), with associated compressive atelectasis, incompletely imaged. 3. Gaseous distention of the proximal jejunum, presumably associated ileus. Electronically Signed   By: Bary Richard M.D.   On: 01/05/2019 13:31   Dg Abd 2 Views  Result Date: 01/06/2019 CLINICAL DATA:  Ileus, enteric tube in place EXAM: ABDOMEN - 2 VIEW COMPARISON:  01/05/2019 CT abdomen/pelvis and abdominal radiographs. FINDINGS: Enteric tube terminates in the body of the stomach. Marked diffuse small bowel dilatation up to 7.7 cm diameter, with air-fluid levels, not substantially changed. Retained oral contrast is noted throughout nondistended large bowel. No evidence of pneumatosis or pneumoperitoneum. Small bilateral pleural effusions, left greater than right, and bibasilar lung opacities appear unchanged. No radiopaque nephrolithiasis. IMPRESSION: No change in marked diffuse small bowel dilatation with air-fluid levels. Retained oral contrast throughout the nondistended large bowel. Findings are compatible with persistent severe adynamic ileus of the small bowel versus partial distal small bowel obstruction. Small bilateral pleural effusions, left greater than right, with bibasilar lung opacities, unchanged. Electronically Signed   By: Delbert Phenix M.D.   On: 01/06/2019 08:36   Dg Abd Acute 2+v W 1v Chest  Result Date: 01/05/2019 CLINICAL DATA:  Ileus, NG tube, Abdomen pain/swelling EXAM: DG ABDOMEN ACUTE W/ 1V CHEST COMPARISON:  the previous day's exam, and earlier studies FINDINGS: Heart size normal. Left pleural effusion, and consolidation/atelectasis at the left  lung base, which have developed since CT 12/27/2018. Nasogastric tube to the decompressed stomach. Dilated small bowel loops with fluid levels, similar in number and degree of dilatation since prior study. Colon decompressed. Left pelvic phlebolith. Regional bones unremarkable. IMPRESSION: 1. Nasogastric tube to the stomach. 2. Persistent small bowel obstruction. 3. Left lower lung atelectasis/consolidation with effusion Electronically Signed   By: Corlis Leak M.D.   On: 01/05/2019 08:59   [place] Ekg Site Rite  Result Date: 01/05/2019 If Site Rite image not attached, placement could not be confirmed due to current cardiac rhythm.   Assessment / Plan:  1. 28 y.o. male with EtOH pancreatitis+ hypertriglyceridema.  Increasing RUQ pain. LFTs WNLs. Lipase 47. Repeat A/P CT 3/22 showed new  large amount of free fluid and phlegmonous/inflammatory material surrounding the pancreas with extension into the bilateral paracolic gutters and pelvis. Slightly higher density fluid is seen about the pancreatic body and tail raising the possibility of some intermixed blood products. Probable reactive thickening of the walls of the stomach and duodenum. Gaseous distention of the proximal jejunum, distended to approximately 5 cm diameter, presumed associated ileus. Remainder of the large and small bowel is of normal caliber. Gaseous distention of the proximal jejunum. -Patient to transfer to ICU step down  -IVF 150cc/hr -NPO  2.Small bowelIleussecondary to pancreatitis -continue NGT to LIS -monitor abdominal distension, BMs -Abd xray 2V  3. Anemia Hg 7.5. HCT 24.1. MCV 105.7. Iron  -transfuse for Hg < 7 -IV iron if not already given  -no plans for EGD/colonoscopy at this time  4. Leukocytosis d/t pleural effusion vs pancreatitis? WBC 13.5. febrile with temp 101F. A/P CT see results above,  CT also shows new bibasilar pleural effusions, left greater than right with associated compressive atelectasis. Blood  cultures pending. -Levaquin  IV Q 24 hrs -management per hospitalist -continue to monitor Temp and WBC  5. Hyponatremia. Na 133.  6. Malnourished. Albumin 2.0. PICC line for TPN on hold d/t leukocytosis. PICC line to be placed.     Charles Dougherty  01/06/2019, 9:29 AM   Attending physician's note   I have taken an interval history, reviewed the chart and examined the patient. I agree with the Advanced Practitioner's note, impression and recommendations.  28 year old with acute severe necrotizing pancreatitis with acute inflammatory/necrotic fluid collection (d/t ETOH/hypertriglyceridemia) complicated by ileus and pneumonia. Has leukocytosis with fever thought to be due to pneumonia. Has B/L pleural effusions. 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  Plan: Broaden antibiotic coverage to meropenem.  Continue NG tube to LWS, serial x-ray KUBs, TPN, monitor and correct electrolytes, monitor triglycerides especially on TPN.  Ambulate.  Decrease IV fluids (may cause abdominal compartment syndrome), start IV albumin.  If continues to spike fever or if he fails to improve, will consider IR consultation for drainage/cultures.  Will have Dr. Meridee Score (interventional gastroenterology) review his scans tomorrow for any other recommendations.  Expect prolonged clinical course with high chance of long-term sequela/complications including pseudocysts.  CT was reviewed independently.  Discussed with patient's mother over the phone.  Edman Circle, MD

## 2019-01-07 ENCOUNTER — Inpatient Hospital Stay (HOSPITAL_COMMUNITY): Payer: Federal, State, Local not specified - PPO

## 2019-01-07 ENCOUNTER — Ambulatory Visit: Payer: Self-pay | Admitting: Adult Health

## 2019-01-07 DIAGNOSIS — K8591 Acute pancreatitis with uninfected necrosis, unspecified: Secondary | ICD-10-CM | POA: Clinically undetermined

## 2019-01-07 DIAGNOSIS — D72829 Elevated white blood cell count, unspecified: Secondary | ICD-10-CM

## 2019-01-07 DIAGNOSIS — Z978 Presence of other specified devices: Secondary | ICD-10-CM

## 2019-01-07 LAB — LACTATE DEHYDROGENASE: LDH: 363 U/L — AB (ref 98–192)

## 2019-01-07 LAB — GLUCOSE, CAPILLARY
Glucose-Capillary: 150 mg/dL — ABNORMAL HIGH (ref 70–99)
Glucose-Capillary: 150 mg/dL — ABNORMAL HIGH (ref 70–99)
Glucose-Capillary: 116 mg/dL — ABNORMAL HIGH (ref 70–99)
Glucose-Capillary: 98 mg/dL (ref 70–99)
Glucose-Capillary: 122 mg/dL — ABNORMAL HIGH (ref 70–99)
Glucose-Capillary: 128 mg/dL — ABNORMAL HIGH (ref 70–99)

## 2019-01-07 LAB — CBC WITH DIFFERENTIAL/PLATELET
Abs Immature Granulocytes: 0.21 10*3/uL — ABNORMAL HIGH (ref 0.00–0.07)
BASOS PCT: 0 %
Basophils Absolute: 0 10*3/uL (ref 0.0–0.1)
Eosinophils Absolute: 0.1 10*3/uL (ref 0.0–0.5)
Eosinophils Relative: 1 %
HCT: 20.5 % — ABNORMAL LOW (ref 39.0–52.0)
Hemoglobin: 6.6 g/dL — CL (ref 13.0–17.0)
Immature Granulocytes: 2 %
Lymphocytes Relative: 6 %
Lymphs Abs: 0.7 10*3/uL (ref 0.7–4.0)
MCH: 32 pg (ref 26.0–34.0)
MCHC: 32.2 g/dL (ref 30.0–36.0)
MCV: 99.5 fL (ref 80.0–100.0)
Monocytes Absolute: 0.8 10*3/uL (ref 0.1–1.0)
Monocytes Relative: 6 %
NRBC: 0 % (ref 0.0–0.2)
Neutro Abs: 11.2 10*3/uL — ABNORMAL HIGH (ref 1.7–7.7)
Neutrophils Relative %: 85 %
PLATELETS: 488 10*3/uL — AB (ref 150–400)
RBC: 2.06 MIL/uL — ABNORMAL LOW (ref 4.22–5.81)
RDW: 13.4 % (ref 11.5–15.5)
WBC: 13 10*3/uL — ABNORMAL HIGH (ref 4.0–10.5)

## 2019-01-07 LAB — BASIC METABOLIC PANEL
ANION GAP: 9 (ref 5–15)
BUN: 8 mg/dL (ref 6–20)
CO2: 23 mmol/L (ref 22–32)
Calcium: 8 mg/dL — ABNORMAL LOW (ref 8.9–10.3)
Chloride: 103 mmol/L (ref 98–111)
Creatinine, Ser: 0.67 mg/dL (ref 0.61–1.24)
GFR calc Af Amer: >60 mL/min (ref >60)
GFR calc non Af Amer: >60 mL/min (ref >60)
Glucose, Bld: 148 mg/dL — ABNORMAL HIGH (ref 70–99)
Potassium: 3.4 mmol/L — ABNORMAL LOW (ref 3.5–5.1)
Sodium: 135 mmol/L (ref 135–145)

## 2019-01-07 LAB — HEMOGLOBIN AND HEMATOCRIT, BLOOD
HCT: 24.2 % — ABNORMAL LOW (ref 39.0–52.0)
HEMOGLOBIN: 7.9 g/dL — AB (ref 13.0–17.0)

## 2019-01-07 LAB — TRIGLYCERIDES: Triglycerides: 203 mg/dL — ABNORMAL HIGH (ref <150)

## 2019-01-07 LAB — C-REACTIVE PROTEIN: CRP: 23 mg/dL — ABNORMAL HIGH (ref <1.0)

## 2019-01-07 LAB — LEGIONELLA PNEUMOPHILA SEROGP 1 UR AG: L. pneumophila Serogp 1 Ur Ag: NEGATIVE

## 2019-01-07 LAB — MAGNESIUM: MAGNESIUM: 2.1 mg/dL (ref 1.7–2.4)

## 2019-01-07 LAB — PHOSPHORUS: PHOSPHORUS: 3.4 mg/dL (ref 2.5–4.6)

## 2019-01-07 MED ORDER — ACETAMINOPHEN 650 MG RE SUPP
325.0000 mg | Freq: Once | RECTAL | Status: AC
  Administered 2019-01-07: 325 mg via RECTAL
  Filled 2019-01-07: qty 1

## 2019-01-07 MED ORDER — DEXTROSE-NACL 5-0.9 % IV SOLN: INTRAVENOUS | Status: DC

## 2019-01-07 MED ORDER — TRAVASOL 10 % IV SOLN
INTRAVENOUS | Status: AC
  Administered 2019-01-07: 18:00:00 via INTRAVENOUS
  Filled 2019-01-07: qty 720

## 2019-01-07 MED ORDER — SODIUM CHLORIDE 0.9% IV SOLUTION
Freq: Once | INTRAVENOUS | Status: AC
  Administered 2019-01-07: 12:00:00 via INTRAVENOUS

## 2019-01-07 MED ORDER — FUROSEMIDE 10 MG/ML IJ SOLN
40.0000 mg | Freq: Once | INTRAMUSCULAR | Status: AC
  Administered 2019-01-07: 40 mg via INTRAVENOUS
  Filled 2019-01-07: qty 4

## 2019-01-07 MED ORDER — DIPHENHYDRAMINE HCL 50 MG/ML IJ SOLN
25.0000 mg | Freq: Once | INTRAMUSCULAR | Status: AC
  Administered 2019-01-07: 25 mg via INTRAVENOUS
  Filled 2019-01-07: qty 1

## 2019-01-07 MED ORDER — INSULIN ASPART 100 UNIT/ML ~~LOC~~ SOLN
0.0000 [IU] | SUBCUTANEOUS | Status: DC
  Administered 2019-01-07 (×2): 1 [IU] via SUBCUTANEOUS
  Administered 2019-01-07: 2 [IU] via SUBCUTANEOUS
  Administered 2019-01-08 – 2019-01-10 (×11): 1 [IU] via SUBCUTANEOUS

## 2019-01-07 MED ORDER — POTASSIUM CHLORIDE 10 MEQ/50ML IV SOLN
10.0000 meq | INTRAVENOUS | Status: AC
  Administered 2019-01-07 (×2): 10 meq via INTRAVENOUS
  Filled 2019-01-07 (×2): qty 50

## 2019-01-07 MED ORDER — FUROSEMIDE 10 MG/ML IJ SOLN
INTRAMUSCULAR | Status: AC
  Administered 2019-01-07: 40 mg via INTRAVENOUS
  Filled 2019-01-07: qty 4

## 2019-01-07 MED ORDER — FUROSEMIDE 10 MG/ML IJ SOLN
40.0000 mg | Freq: Once | INTRAMUSCULAR | Status: AC
  Administered 2019-01-07: 40 mg via INTRAVENOUS

## 2019-01-07 MED ORDER — TEMAZEPAM 15 MG PO CAPS
15.0000 mg | ORAL_CAPSULE | Freq: Every evening | ORAL | Status: DC | PRN
  Administered 2019-01-07: 15 mg via ORAL
  Filled 2019-01-07: qty 1

## 2019-01-07 NOTE — Progress Notes (Signed)
Spoke with Darl Pikes in [facility] Bank about pt's 2nd unit of blood. Advised MD would like 2nd unit to be given. Blood bank informed this RN of new protocol- do not give unless active bleeding, hemodynamically unstable or hgb <7. Pt hgb after one unit RBC's 7.9. Pt has necrotizing pancreatitis and advised by MD can deteriorate quickly. MD advised to monitor pt closely.

## 2019-01-07 NOTE — Progress Notes (Signed)
PROGRESS NOTE    Charles Dougherty  IRS:[id number] DOB: Jul 14, 1991 DOA: 12/27/2018 PCP: Patient, No Pcp Per    Brief Narrative:  Patient is a 28 year old male with history of alcohol abuse, substance abuse, smoker who presents to the emergencydepartment with complaints of abdominal pain. Admitted for the management of acute pancreatitis related to alcohol. Incidentally found to have severe hypertriglyceridemia.He has positive family history.  Hospital course remarkable for abdominal discomfort, distention .Abdominal x-ray suggested early bowel obstruction.  General surgery consulted and he was started on conservative management with NG tube decompression, n.p.o. status. Unclear if the pancreatitis was precipitated by alcohol or hypertriglyceridemia.  Due to severe hypertriglyceridemia ,we started on insulin drip and moved to stepdown.  GI also consulted.   Assessment & Plan:   Principal Problem:   Acute necrotizing pancreatitis Active Problems:   Hypertriglyceridemia   Acute alcoholic pancreatitis   Pancreatitis   ETOH abuse   Drug abuse (HCC)   Cystitis   Ileus (HCC)   Abdominal pain   SBO (small bowel obstruction) (HCC)   Hypophosphatemia   Hypomagnesemia   Hypocalcemia   AKI (acute kidney injury) (HCC)   Anemia   Fever   Pneumonia of left lower lobe due to infectious organism [facility])  1 acute necrotizing pancreatitis secondary to alcohol and hypertriglyceridemia Patient had presented with abdominal pain nausea and vomiting noted to have significantly elevated lipase levels and transaminitis on admission.  Patient noted to be every day drinker usually drinks beer.  Patient placed on conservative management with IV fluids, n.p.o. and pain management.  CT abdomen and pelvis which was done early on during the hospitalization, did show infiltration and edema around the head and body of the pancreas consistent with acute edematous interstitial pancreatitis.  No loculated fluid  collections, no evidence of pancreatic necrosis.  Patient currently with NG tube in place states some improvement with abdominal pain.  Blood pressure slightly elevated likely secondary to current pain.  IV fluids have been decreased to 50 cc/h.  NG tube was to be discontinued 01/01/2019, and was clamped however patient unable to tolerate and as such NG tube was placed back to suction.  NG tube with output of 950 cc over the past 24 hours.  Patient with clamping trial and trial of clear liquids on 01/03/2019 however patient was reluctant at that time.  Patient reluctant currently.  Patient noted to have fever of 101 the morning of 01/05/2019, and 101.5 the morning of 01/06/2019.  Patient now tachycardic.  Prealbumin of 7.0.  LDH of 474.  Hemoglobin down to 6.6 from 7.5 from 8.0 from 8.5 from 9.2 from 13.2 on 12/31/2018.  CT abdomen and pelvis (01/05/2019)with new large amount of free fluid and phlegmonous/inflammatory material surrounding the pancreas extending into bilateral paracolic gutters and pelvis.  Slightly higher density fluid is seen about the pancreatic body and tail raising possibility of some intermixed blood products.  No circumscribed fluid collection or abscess-like collection identified.  New bibasilar pleural effusions, left greater than right least moderate in size on the left.  With associated compressive atelectasis incompletely imaged.  Gaseous distention of proximal jejunum, presumably associated with ileus.  Patient also placed on Levaquin.  May have ice chips.  TPN has been started.  PICC line placed.  Blood cultures with no growth to date.  Patient with worsening acute necrotizing pancreatitis and as such has been transferred back to the stepdown unit.  IV Levaquin has been discontinued and antibiotic broadened to IV meropenem.  Hemoglobin  down to 6.6 and as such we will transfuse 2 units packed red blood cells.  Patient on IV albumin x1 day.  Has been ordered and awaiting PICC line to be placed.   Blood cultures negative to date x1 day.  Per GI if patient continues to spike fever or continues to deteriorate may need IR consultation for drainage and cultures.  GI following and appreciate input and recommendations.   2.  Severe hypertriglyceridemia Patient with family history of hypertriglyceridemia.  Patient's uncle noted to have this.  Patient noted to have triglycerides levels greater than 5000 with repeat labs confirming it.  Patient was placed on the insulin drip with significant improvement with elevated triglyceride levels.  Triglycerides at 203 today 01/07/2019.  Insulin drip has been discontinued. Will need to be on the fenofibrate on discharge.  Will need outpatient follow-up with PCP and probable referral to endocrinology.  Alcohol cessation has been stressed to patient.  3.  Small bowel obstruction versus ileus secondary to pancreatitis Patient with 750cc output from NG tube over the past 24 hours.  Abdominal films with no significant change with dilated loops of bowel.  Patient states had bowel movement this morning. Patient with some right upper quadrant abdominal pain. Abdominal films with no significant improvement.  Clamping trial was to be done with a trial of clear liquids on 01/03/2019 however patient was reluctant and clamping trial was not done.  Patient still with dilated bowels of loop seen on abdominal film.  Continue simethicone 160 mg p.o. 4 times daily.  Patient started on IV Reglan per gastroenterology.  Leave NG tube to suction per GI.  CT abdomen and pelvis with new large amount of free fluid and phlegmonous/inflammatory material surrounding the pancreas extending into bilateral paracolic gutters and pelvis.  Slightly higher density fluid is seen about the pancreatic body and tail raising possibility of some intermixed blood products.  No circumscribed fluid collection or abscess-like collection identified.  New bibasilar pleural effusions, left greater than right least  moderate in size on the left.  With associated compressive atelectasis incompletely imaged.  Gaseous distention of proximal jejunum, presumably associated with ileus.  Patient with fevers yesterday and patient felt to have worsening acute necrotizing pancreatitis.  No significant improvement with ileus.  Abdominal films pending.  TPN started.  PICC line placed.  IV antibiotics broadened to meropenem. Per GI.    5.  Left lower lobe atelectasis/consolidation Per chest x-ray.  Patient noted to have a temp of 101 early this morning.  Patient with a worsening leukocytosis.  Blood cultures pending with no growth to date.  Urine strep pneumococcus antigen negative.  Urine Legionella antigen pending.  Patient started on IV Levaquin per GI on 01/04/2019 and dose increased to Levaquin 750 mg daily to cover for probable pneumonia.  IV antibiotic coverage has been broadened to meropenem and as such Levaquin discontinued.  Continue supportive care.   6.  Fever Patient with a temperature 101 the morning of 01/05/2019 and 01/06/2019.  Likely secondary to worsening pancreatitis and #5.  Patient with acute necrotizing pancreatitis.  Blood cultures pending with no growth to date.  Urinalysis nitrite negative.  Urine strep pneumococcus antigen is negative.  Urine Legionella antigen pending.  Chest x-ray with left lower lobe atelectasis versus consolidation.  Patient was started on IV Levaquin per GI on 01/04/2019 which was discontinued 01/06/2019 and antibiotic coverage broadened to Merrem on 01/06/2019.  Patient currently afebrile.  Follow.   7.  Hypophosphatemia/hypokalemia Repleted.  Phosphorus  level at 4.2.   8.  Anemia Likely dilutional and secondary to worsening pancreatitis.  Patient with no overt bleeding.  Anemia panel consistent with anemia of chronic disease/iron deficiency anemia.  Iron level at 32.  Ferritin elevated at 1383.  Hemoglobin currently at 6.6 from 7.5 from 8.0 from 8.3 from 7.1 from 8.9 from 13.2.  FOBT  negative.  Status post 1 unit packed red blood cells 01/02/2019.  Continue PPI.  IV Feraheme ordered.  Patient with a acute necrotizing pancreatitis that is worsening hemoglobin down to 6.6.  Transfused 2 units packed red blood cells.  Follow H&H. Per GI upper endoscopy to be done in the outpatient setting.  GI following.  9.  Hypocalcemia/hypoalbuminemia Hypocalcemia associated likely with pancreatitis.  Patient also noted to have a severe hypoalbuminemia of 1.9.  Corrected calcium at 9.32.  Patient given some doses of IV calcium gluconate early on during the hospitalization.  TPN ordered and started the evening of 01/06/2019.  Patient also placed on IV albumin every 6 hours x1 day. Follow.   10.  Transaminitis Images done did not show any gallbladder stones or inflammation.  Patient noted to have diffuse fatty liver.  LFTs trended down.  Follow.    11.  Thrombocytopenia Likely secondary to alcoholic liver disease.  Lovenox discontinued.  No overt bleeding.  Platelet count improving currently at 488.  Follow.  12.  Suspected cystitis Per CT imaging.  Patient currently asymptomatic.  Urinalysis unremarkable.   13.  Acute kidney injury Resolved with hydration.  Follow.   14.  Hypoglycemic events Felt secondary to insulin drip.  Patient also noted to be n.p.o.  Insulin drip has been discontinued.  CBG 150 this morning.  Patient on TPN.  Discontinue IV fluids.    15 alcohol abuse No signs of withdrawal.  Alcohol cessation stressed to patient.  Social work consulted.  Follow.  16.  Malnutrition Patient presented with acute pancreatitis.  Has essentially been n.p.o. since admission.  Prealbumin of 7.0.  TPN ordered and started the evening of 01/06/2019.  Patient also placed on IV albumin every 6 hours x1 day.  GI following.   DVT prophylaxis: SCDs, ambulation Code Status: Full Family Communication: Updated patient.  No family at bedside. Disposition Plan: Remain in stepdown unit.  Patient with  high risk of acute decompensation as patient now with acute necrotizing pancreatitis.    Consultants:   General surgery: Dr.Tsuei 12/29/2018  Gastroenterology: Dr. Barron Alvine 12/30/2018  Procedures:   CT abdomen and pelvis 12/28/2018  Right upper quadrant ultrasound 12/27/2018  Abdominal films 12/29/2018, 12/30/2018, 12/31/2018, 01/01/2019, 01/02/2019, 01/03/2019, 01/04/2019, 01/06/2019  Transfused 1 unit packed red blood cells 01/02/2019  Acute abdominal series 01/05/2019  CT abdomen and pelvis 01/05/2019  Transfuse 2 units packed red blood cells pending 01/07/2019  PICC line placed 01/06/2019  Antimicrobials:   IV Levaquin 01/04/2019>>>> 01/06/2019  IV Merrem 01/06/2019   Subjective: Patient laying in bed.  Stated was given Ambien last night and had some vivid dreams with inability to sleep well.  Patient states he woke up last night with some shortness of breath and heart racing which occurred on and off throughout the night.  Still with complaints of right upper quadrant abdominal pain.  Denies any chest pain. States had a greenish bowel movement this morning.  Denies any overt bleeding.  Feeling better this morning than he did last night.    Objective: Vitals:   01/07/19 0737 01/07/19 0800 01/07/19 0900 01/07/19 1000  BP: (!) 149/89 Marland Kitchen)  153/96  (!) 152/88  Pulse: (!) 115 (!) 109  98  Resp: 20 (!) 23  (!) 26  Temp:   99.9 F (37.7 C)   TempSrc:   Oral   SpO2: 93% 95%  97%  Weight:      Height:        Intake/Output Summary (Last 24 hours) at 01/07/2019 1030 Last data filed at 01/07/2019 0902 Gross per 24 hour  Intake 2719.14 ml  Output 1825 ml  Net 894.14 ml   Filed Weights   01/05/19 0549 01/06/19 0500 01/07/19 0351  Weight: 77.5 kg 77.3 kg 77.3 kg    Examination:  General exam: NG tube in place.  Respiratory system: Decreased breath sounds in the bases.  No wheezing.  No crackles.  No rhonchi.  Normal respiratory effort.  Cardiovascular system: Tachycardia.  No  murmurs rubs or gallops.  No JVD.  No lower extremity edema.  Gastrointestinal system: Abdomen is less distended, tight, hypoactive bowel sounds, some tenderness to palpation in the right upper quadrant.  No rebound.  No guarding.  Central nervous system: Alert and oriented. No focal neurological deficits. Extremities: Symmetric 5 x 5 power. Skin: No rashes, lesions or ulcers Psychiatry: Judgement and insight appear normal. Mood & affect appropriate.     Data Reviewed: I have personally reviewed following labs and imaging studies  CBC: Recent Labs  Lab 01/01/19 0550 01/02/19 0951  01/03/19 0320 01/04/19 0518 01/05/19 0548 01/06/19 0503 01/07/19 0830  WBC 7.9 10.0  --  12.5* 13.3* 14.8* 13.5* 13.0*  NEUTROABS 6.0 8.1*  --   --  11.4*  --  11.1* 11.2*  HGB 8.9* 7.1*   < > 8.3* 8.5* 8.0* 7.5* 6.6*  HCT 27.1* 21.9*   < > 24.9* 26.3* 23.8* 24.1* 20.5*  MCV 101.1* 102.3*  --  98.0 101.9* 102.6* 105.7* 99.5  PLT 83* 109*  --  134* 213 316 410* 488*   < > = values in this interval not displayed.   Basic Metabolic Panel: Recent Labs  Lab 01/01/19 0550  01/02/19 0622 01/03/19 0320 01/04/19 0518 01/05/19 0548 01/06/19 0503 01/07/19 0314  NA 131*   < > 135 135 133* 134* 133* 135  K 4.3   < > 3.8 3.9 3.8 3.9 3.8 3.4*  CL 102   < > 103 104 100 102 102 103  CO2 23   < > 21* 21* 23  GLUCOSE 125*   < > 118* 106* 102* 96 94 148*  BUN 7   < > CREATININE 0.69   < > 0.61 0.63 0.65 0.70 0.69 0.67  CALCIUM 6.9*   < > 7.4* 7.8* 7.9* 7.8* 7.9* 8.0*  MG 2.0  --   --  2.2  --  2.1 2.1 2.1  PHOS 2.4*  --  2.8 3.8  --   --  4.2 3.4   < > = values in this interval not displayed.   GFR: Estimated Creatinine Clearance: 145.7 mL/min (by C-G formula based on SCr of 0.67 mg/dL). Liver Function Tests: Recent Labs  Lab 01/01/19 0550 01/02/19 0622 01/03/19 0320 01/04/19 0518 01/06/19 0503  AST 72* 53* 50* 35 30  ALT 47* 41 40 31 27  ALKPHOS 53 57 71 72 63  BILITOT  2.8* 1.8* 1.9* 1.6* 1.6*  PROT 4.8* 4.8* 5.1* 5.2* 5.0*  ALBUMIN 2.1* 2.0* 2.1* 2.1* 2.0*   Recent Labs  Lab 01/02/19 0622 01/06/19 0503  LIPASE 67* 47   No results for input(s): AMMONIA in the last 168 hours. Coagulation Profile: Recent Labs  Lab 01/06/19 0503  INR 1.3*   Cardiac Enzymes: No results for input(s): CKTOTAL, CKMB, CKMBINDEX, TROPONINI in the last 168 hours. BNP (last 3 results) No results for input(s): PROBNP in the last 8760 hours. HbA1C: No results for input(s): HGBA1C in the last 72 hours. CBG: Recent Labs  Lab 01/06/19 1757 01/06/19 1942 01/06/19 2304 01/07/19 0348 01/07/19 0857  GLUCAP 71 89 111* 150* 150*   Lipid Profile: Recent Labs    01/06/19 0503 01/07/19 0830  TRIG 260* 203*   Thyroid Function Tests: No results for input(s): TSH, T4TOTAL, FREET4, T3FREE, THYROIDAB in the last 72 hours. Anemia Panel: No results for input(s): VITAMINB12, FOLATE, FERRITIN, TIBC, IRON, RETICCTPCT in the last 72 hours. Sepsis Labs: No results for input(s): PROCALCITON, LATICACIDVEN in the last 168 hours.  Recent Results (from the past 240 hour(s))  MRSA PCR Screening     Status: None   Collection Time: 12/30/18 11:24 AM  Result Value Ref Range Status   MRSA by PCR NEGATIVE NEGATIVE Final    Comment:        The GeneXpert MRSA Assay (FDA approved for NASAL specimens only), is one component of a comprehensive MRSA colonization surveillance program. It is not intended to diagnose MRSA infection nor to guide or monitor treatment for MRSA infections. Performed at [facility], [address] [place], [place] [address]   Culture, blood (Routine X 2) w Reflex to ID Panel     Status: None (Preliminary result)   Collection Time: 01/05/19  9:05 AM  Result Value Ref Range Status   Specimen Description   Final    BLOOD RIGHT HAND Performed at [facility], [address], [place], [place] [address]    Special  Requests   Final    BOTTLES DRAWN AEROBIC ONLY Blood Culture adequate volume Performed at [facility], [address], [place], [place] [address]    Culture   Final    NO GROWTH < 24 HOURS Performed at [facility], [address] [place], [place] [address]    Report Status PENDING  Incomplete  Culture, blood (Routine X 2) w Reflex to ID Panel     Status: None (Preliminary result)   Collection Time: 01/05/19  9:05 AM  Result Value Ref Range Status   Specimen Description   Final    BLOOD LEFT HAND Performed at [facility], [address] [place], [place] [address]    Special Requests   Final    BOTTLES DRAWN AEROBIC ONLY Blood Culture adequate volume Performed at [facility], [address], [place], [place] [address]    Culture   Final    NO GROWTH < 24 HOURS Performed at [facility], [address], [place], [place] [address]    Report Status PENDING  Incomplete  Culture, Urine     Status: None   Collection Time: 01/05/19 10:55 AM  Result Value Ref Range Status   Specimen Description   Final    URINE, CLEAN CATCH Performed at [facility], [address] [place], [place] [address]    Special Requests   Final    NONE Performed at [facility], [address], [place], [place] [address]    Culture   Final    NO GROWTH Performed at [facility], [address] [place], [place] [address]    Report Status 01/06/2019 FINAL  Final         Radiology Studies: Ct Abdomen Pelvis W Contrast  Result Date: 01/05/2019 CLINICAL DATA:  Abdominal pain and distension. Pancreatitis. EXAM: CT ABDOMEN AND PELVIS WITH CONTRAST TECHNIQUE: Multidetector CT imaging of the abdomen and pelvis was performed using the standard protocol following bolus administration of intravenous contrast. CONTRAST:  OMNIPAQUE IOHEXOL 300 MG/ML SOLN, 30mL OMNIPAQUE IOHEXOL 300 MG/ML  SOLN COMPARISON:  CT abdomen dated 12/27/2018. FINDINGS: Lower chest: New bibasilar pleural effusions, LEFT greater than RIGHT, with associated compressive atelectasis, incompletely imaged. Hepatobiliary: No focal liver abnormality is seen. No gallstones, gallbladder wall thickening, or biliary dilatation. Pancreas: There is now a large amount of free fluid and phlegmonous/inflammatory material surrounding the pancreas with extension into the bilateral paracolic gutters and pelvis. Slightly higher density fluid is seen about the pancreatic body and tail raising the possibility of some intermixed blood products. Spleen: Normal in size without focal abnormality. Adrenals/Urinary Tract: Adrenal glands appear normal. Kidneys are unremarkable without mass, stone or hydronephrosis. Bladder is unremarkable, partially decompressed. Stomach/Bowel: Enteric tube appears well positioned in the stomach. Probable reactive thickening of the walls of the stomach and duodenum. Gaseous distention of the proximal jejunum, distended to approximately 5 cm diameter, presumed associated ileus. Remainder of the large and small bowel is of normal caliber. Vascular/Lymphatic: No significant vascular findings are present. No enlarged abdominal or pelvic lymph nodes. Reproductive: Prostate is unremarkable. Other: No circumscribed fluid collection or abscess like collection identified at this time. No pseudocyst formation at this time. No free intraperitoneal air. Musculoskeletal: No acute or suspicious osseous finding. IMPRESSION: 1. New large amount of free fluid and phlegmonous/inflammatory material surrounding the pancreas, extending into the bilateral paracolic gutters and pelvis. Slightly higher density fluid is seen about the pancreatic body and tail raising the possibility of some intermixed blood products. No circumscribed fluid collection or abscess like collection identified at this time. 2. New bibasilar pleural effusions, left  greater than right (at least moderate in size on the LEFT), with associated compressive atelectasis, incompletely imaged. 3. Gaseous distention of the proximal jejunum, presumably associated ileus. Electronically Signed   By: Bary Richard M.D.   On: 01/05/2019 13:31   Dg Abd 2 Views  Result Date: 01/06/2019 CLINICAL DATA:  Ileus, enteric tube in place EXAM: ABDOMEN - 2 VIEW COMPARISON:  01/05/2019 CT abdomen/pelvis and abdominal radiographs. FINDINGS: Enteric tube terminates in the body of the stomach. Marked diffuse small bowel dilatation up to 7.7 cm diameter, with air-fluid levels, not substantially changed. Retained oral contrast is noted throughout nondistended large bowel. No evidence of pneumatosis or pneumoperitoneum. Small bilateral pleural effusions, left greater than right, and bibasilar lung opacities appear unchanged. No radiopaque nephrolithiasis. IMPRESSION: No change in marked diffuse small bowel dilatation with air-fluid levels. Retained oral contrast throughout the nondistended large bowel. Findings are compatible with persistent severe adynamic ileus of the small bowel versus partial distal small bowel obstruction. Small bilateral pleural effusions, left greater than right, with bibasilar lung opacities, unchanged. Electronically Signed   By: Delbert Phenix M.D.   On: 01/06/2019 08:36   [place] Ekg Site Rite  Result Date: 01/05/2019 If Site Rite image not attached, placement could not be confirmed due to current cardiac rhythm.       Scheduled Meds:  sodium chloride   Intravenous Once   acetaminophen  325 mg Rectal Once   Chlorhexidine  Gluconate Cloth  6 each Topical Daily   Chlorhexidine Gluconate Cloth  6 each Topical Daily   diphenhydrAMINE  25 mg Intravenous Once   furosemide  40 mg Intravenous Once   furosemide  40 mg Intravenous Once   insulin aspart  0-9 Units Subcutaneous Q4H   metoCLOPramide (REGLAN) injection  10 mg Intravenous Q8H   nicotine  14 mg  Transdermal Daily   pantoprazole (PROTONIX) IV  40 mg Intravenous Q12H   simethicone  160 mg Oral QID   sodium chloride flush  10-40 mL Intracatheter Q12H   Continuous Infusions:  sodium chloride Stopped (01/02/19 1042)   albumin human Stopped (01/07/19 0735)   dextrose 5 % and 0.9% NaCl 40 mL/hr at 01/07/19 0855   meropenem (MERREM) IV 1 g (01/07/19 1019)   potassium chloride 10 mEq (01/07/19 0902)   TPN ADULT (ION) 50 mL/hr at 01/07/19 0902   TPN ADULT (ION)       LOS: 10 days    Time spent: 40 mins    Ramiro Harvest, MD [facility]  If 7PM-7AM, please contact night-coverage [email or web address] 01/07/2019, 10:30 AM

## 2019-01-07 NOTE — Progress Notes (Addendum)
[facility] Progress Note  CC:  EtOH + hypertriglyceridemia pancreatitis   Subjective: He reported having a terrible night due to bad dreams after taking Ambien. He reported waking up SOB with his heart racing which occurred on and off throughout the night. RUQ pain continues, rates 7 to 10, down to 4 or 5 after pain meds. Passed a soft green BM today with a small amount of red blood on the toilet tissue.   Objective:  Vital signs in last 24 hours: Temp:  [98.2 F (36.8 C)-99.9 F (37.7 C)] 99.9 F (37.7 C) (03/24 0000) Pulse Rate:  [104-119] 114 (03/24 0215) Resp:  [27-34] 27 (03/24 0215) BP: (156-161)/(76-97) 156/89 (03/23 2200) SpO2:  [92 %-96 %] 92 % (03/24 0215) Weight:  [77.3 kg] 77.3 kg (03/24 0351) Last BM Date: 01/05/19 General:   Alert, fatigued appearing in NAD  Heart:  Tachycardic, no mumur Pulm: clear, diminished in the bases Abdomen:  Soft, mildly distended, generalized tenderness without rebound or guarding,  few bowel sounds, NGT to LIS draining    Extremities:  Without edema. Neurologic:  Alert and  oriented x4;  grossly normal neurologically. Psych:  Alert and cooperative. Normal mood and affect.  Intake/Output from previous day: 03/23 0701 - 03/24 0700 In: 1980.3 [P.O.:100; I.V.:426.5; IV Piggyback:1453.8] Out: 2125 [Urine:1375; Emesis/NG output:750] Intake/Output this shift: No intake/output data recorded.  Lab Results: Recent Labs    01/05/19 0548 01/06/19 0503  WBC 14.8* 13.5*  HGB 8.0* 7.5*  HCT 23.8* 24.1*  PLT 316 410*   BMET Recent Labs    01/05/19 0548 01/06/19 0503 01/07/19 0314  NA 134* 133* 135  K 3.9 3.8 3.4*  CL 102 102 103  CO2 21* 21* 23  GLUCOSE 96 94 148*  BUN 10 9 8   CREATININE 0.70 0.69 0.67  CALCIUM 7.8* 7.9* 8.0*   LFT Recent Labs    01/06/19 0503  PROT 5.0*  ALBUMIN 2.0*  AST 30  ALT 27  ALKPHOS 63  BILITOT 1.6*   PT/INR Recent Labs    01/06/19 0503  LABPROT 16.5*  INR 1.3*    Hepatitis Panel No results for input(s): HEPBSAG, HCVAB, HEPAIGM, HEPBIGM in the last 72 hours.  Ct Abdomen Pelvis W Contrast  Result Date: 01/05/2019 CLINICAL DATA:  Abdominal pain and distension. Pancreatitis. EXAM: CT ABDOMEN AND PELVIS WITH CONTRAST TECHNIQUE: Multidetector CT imaging of the abdomen and pelvis was performed using the standard protocol following bolus administration of intravenous contrast. CONTRAST:  OMNIPAQUE IOHEXOL 300 MG/ML SOLN, 77mL OMNIPAQUE IOHEXOL 300 MG/ML SOLN COMPARISON:  CT abdomen dated 12/27/2018. FINDINGS: Lower chest: New bibasilar pleural effusions, LEFT greater than RIGHT, with associated compressive atelectasis, incompletely imaged. Hepatobiliary: No focal liver abnormality is seen. No gallstones, gallbladder wall thickening, or biliary dilatation. Pancreas: There is now a large amount of free fluid and phlegmonous/inflammatory material surrounding the pancreas with extension into the bilateral paracolic gutters and pelvis. Slightly higher density fluid is seen about the pancreatic body and tail raising the possibility of some intermixed blood products. Spleen: Normal in size without focal abnormality. Adrenals/Urinary Tract: Adrenal glands appear normal. Kidneys are unremarkable without mass, stone or hydronephrosis. Bladder is unremarkable, partially decompressed. Stomach/Bowel: Enteric tube appears well positioned in the stomach. Probable reactive thickening of the walls of the stomach and duodenum. Gaseous distention of the proximal jejunum, distended to approximately 5 cm diameter, presumed associated ileus. Remainder of the large and small bowel is of normal caliber. Vascular/Lymphatic: No significant vascular  findings are present. No enlarged abdominal or pelvic lymph nodes. Reproductive: Prostate is unremarkable. Other: No circumscribed fluid collection or abscess like collection identified at this time. No pseudocyst formation at this time. No free  intraperitoneal air. Musculoskeletal: No acute or suspicious osseous finding. IMPRESSION: 1. New large amount of free fluid and phlegmonous/inflammatory material surrounding the pancreas, extending into the bilateral paracolic gutters and pelvis. Slightly higher density fluid is seen about the pancreatic body and tail raising the possibility of some intermixed blood products. No circumscribed fluid collection or abscess like collection identified at this time. 2. New bibasilar pleural effusions, left greater than right (at least moderate in size on the LEFT), with associated compressive atelectasis, incompletely imaged. 3. Gaseous distention of the proximal jejunum, presumably associated ileus. Electronically Signed   By: Bary Richard M.D.   On: 01/05/2019 13:31   Dg Abd 2 Views  Result Date: 01/06/2019 CLINICAL DATA:  Ileus, enteric tube in place EXAM: ABDOMEN - 2 VIEW COMPARISON:  01/05/2019 CT abdomen/pelvis and abdominal radiographs. FINDINGS: Enteric tube terminates in the body of the stomach. Marked diffuse small bowel dilatation up to 7.7 cm diameter, with air-fluid levels, not substantially changed. Retained oral contrast is noted throughout nondistended large bowel. No evidence of pneumatosis or pneumoperitoneum. Small bilateral pleural effusions, left greater than right, and bibasilar lung opacities appear unchanged. No radiopaque nephrolithiasis. IMPRESSION: No change in marked diffuse small bowel dilatation with air-fluid levels. Retained oral contrast throughout the nondistended large bowel. Findings are compatible with persistent severe adynamic ileus of the small bowel versus partial distal small bowel obstruction. Small bilateral pleural effusions, left greater than right, with bibasilar lung opacities, unchanged. Electronically Signed   By: Delbert Phenix M.D.   On: 01/06/2019 08:36   Dg Abd Acute 2+v W 1v Chest  Result Date: 01/05/2019 CLINICAL DATA:  Ileus, NG tube, Abdomen pain/swelling  EXAM: DG ABDOMEN ACUTE W/ 1V CHEST COMPARISON:  the previous day's exam, and earlier studies FINDINGS: Heart size normal. Left pleural effusion, and consolidation/atelectasis at the left lung base, which have developed since CT 12/27/2018. Nasogastric tube to the decompressed stomach. Dilated small bowel loops with fluid levels, similar in number and degree of dilatation since prior study. Colon decompressed. Left pelvic phlebolith. Regional bones unremarkable. IMPRESSION: 1. Nasogastric tube to the stomach. 2. Persistent small bowel obstruction. 3. Left lower lung atelectasis/consolidation with effusion Electronically Signed   By: Corlis Leak M.D.   On: 01/05/2019 08:59   [place] Ekg Site Rite  Result Date: 01/05/2019 If Site Rite image not attached, placement could not be confirmed due to current cardiac rhythm.   Assessment / Plan: 1.  28 y.o. male with EtOH pancreatitis+ hypertriglyceridema. Repeat A/P CT 3/22 identified severe necrotizing pancreatitis with acute inflammatory/necrotic fluid collection with questionable infectious pancreatic necrosis. Tmax 101 3.23, Temp today 99.9. CBC 3/24 pending. -Meropenem 1gm IV Q 8 hrs -TPN -IV Albumin 25gm IV Q 6 hrs -IVF 50cc/hr -Continue to monitor electrolytes  -Continue to monitor temp and WBC -If continues to spike fever or if he fails to improve, will consider IR consultation for drainage/cultures  2.Small bowelIleussecondary to pancreatitis. Repeat Abd/Pelvic CT 3/22  showed gaseous distention of the proximal jejunum, distended to approximately 5 cm diameter secondary to ileus. NGT drained 750cc  Bile green drainage past 24hrs. -Continue NGT to LIS -Monitor abdominal distension, BMs   3. Anemia Hg 7.5. HCT 24.1. MCV 105.7. Iron 32. Received Feraheme IV  3/23. He passed a green soft BM with  bright red blood on the TP this am. CBC 3/24 pending. -Transfuse for Hg < 7 -Continue to monitor rectal bleeding -No plans for EGD/colonoscopy at this  time  4. Leukocytosis d/t pleural effusion vs pancreatitis?  3/23 WBC 13.5 with temp 101F. A/P CT see results above, CT also shows new bibasilar pleural effusions, left greater than right with associated compressive atelectasis. Blood cultures pending. Urine cx negative. Pt is afebrile this am. -Continue to monitor Temp and WBC -Await BC results  -Continue Meropenem IV   5. Insomnia -DC Ambien -further management per hospitalist   6. Malnutrition -PICC line placed, TPN initiated   7. Etoh abuse -no evidence of withddrawal   Further recommendations per Dr. Chales Abrahams     LOS: 10 days   [facility]  01/07/2019, 7:46 AM    Attending physician's note   I have taken an interval history, reviewed the chart and examined the patient. I agree with the Advanced Practitioner's note, impression and recommendations.   Have discussed with Dr Meridee Score, IR (Dr Lowella Dandy) and Dr Janee Morn (hospitalist).  Have reviewed the CT scans I have also done it independently.  28 year old with acute severe necrotizing pancreatitis with acute inflammatory/necrotic fluid collection (d/t ETOH/hypertriglyceridemia) complicated by ileus/pneumonia/pleural effusion. Has leukocytosis with fever thought to be due to pneumonia. Afeb on meropenam. Hb dropped to 6.6 today. Last CT did show some blood in the inflammatory phlegmon.  Had some rectal bleeding. On TPN  Plan: -Transfuse 2 units of PRBC -Continue TPN, NG suction,  -Check CRP, lactic acid in a.m. -No intra-abdominal drainable fluid per IR. Can have diagnostic tap to rule out infection if needed in the future if he fails to improve. -CTA if any further bleeding/dropping hemoglobin rapidly to rule out pancreatic pseudoaneurysm. -Pulmonary consultation. ?Thoracocentesis/does he have empyema?  Patient is responding well to meropenem, some shortness of breath when he walks -Continue supportive treatment.  Monitor and correct electrolytes.  Watch triglycerides  especially on TPN. -Expect prolonged hospital course with potential of complications. -Patient has assured me he will not drink anymore alcohol.   Edman Circle, MD

## 2019-01-07 NOTE — Progress Notes (Signed)
PHARMACY - ADULT TOTAL PARENTERAL NUTRITION CONSULT NOTE   Pharmacy Consult for TPN Indication: severe pancreatitis  Patient Measurements: Height: 5\' 10"  (177.8 cm) Weight: 170 lb 6.7 oz (77.3 kg) IBW/kg (Calculated) : 73 TPN AdjBW (KG): 78 Body mass index is 24.45 kg/m. Usual Weight: 70.4 kg  Current Nutrition: NPO except for ice chips, sips, clear liquids   IVF: D5NS at 50 ml/hr  Central access: PICC placed 3/23 TPN start date: 3/23  ASSESSMENT                                                                                                          HPI: 55 yoM admitted on 3/14 with pancreatitis (precipitated by alcohol or hypertriglyceridemia).  Hypertriglyceridemia was treated with insulin drip.  Abdominal x-ray suggested early bowel obstruction.  NGT remains on suction.  Unable to tolerate clears.  Significant events:  3/21 Reglan added 3/22: TPN ordered but unable to start today d/t ordering cut off time of 12:00, PICC line was ordered, but not placed, concern with leukocytosis 3/23 Albumin x 4 doses  Today:    Glucose: No hx DM.  A1c 4.9 % on 3/16  Electrolytes: K low (3.4), all others WNL including corrected Ca (9.6)  Renal: SCr WNL  LFTs: WNL  TGs: 375 (3/19), 260 (3/23)  Prealbumin: 13.1 (3/17)  Insulin Requirements:   NUTRITIONAL GOALS                                                                                           RD recs (3/23): Kcal:  2100-2300 Protein:  100-110g Fluid:  2.1L/day  Custom TPN at goal rate of 37ml/hr provides: 102 g/day protein (50   g/L)  510 g/day Dextrose ( 25 %) 2142 Kcal/day  PLAN                                                                                                                         Now: KCl IV x 2 runs  At 1800 today:  Advance TPN to 60 ml/hr. -  Provides 72 g of protein, 288 g of dextrose, and 0 g of lipids which provides 1512 kCals per day, meeting 70 %  of goal kcal and goal  AA  Electrolytes in TPN: Standard, Cl:Ac ratio 1:1  NO lipidsat this time(hold lipids per Dr. Chales Abrahams d/t elevated triglycerides)  Plan to advance as tolerated to the goal rate.  TPN to contain standard multivitamins and trace elements.  Reduce IVF to 54ml/hr.  Add/Change SSI q4h .   TPN lab panels on Mondays & Thursdays.  F/u daily.  Loralee Pacas, PharmD, BCPS Pager: [phone] 01/07/2019,7:09 AM

## 2019-01-07 NOTE — Progress Notes (Signed)
eLink Physician-Brief Progress Note Patient Name: Charles Dougherty DOB: 30-Sep-1991 MRN: [id number]   Date of Service  01/07/2019  HPI/Events of Note  Hospitalist Dr. Janee Morn requesting routine consultation in the morning for ongoing assistance with managing severe pancreatitis with a likely sympathetic pleural effusion  eICU Interventions  Patient added to the PCCM list for AM consultation        Migdalia Dk 01/07/2019, 8:01 PM

## 2019-01-08 ENCOUNTER — Inpatient Hospital Stay (HOSPITAL_COMMUNITY): Payer: Federal, State, Local not specified - PPO

## 2019-01-08 DIAGNOSIS — K8591 Acute pancreatitis with uninfected necrosis, unspecified: Secondary | ICD-10-CM

## 2019-01-08 LAB — GLUCOSE, CAPILLARY
Glucose-Capillary: 136 mg/dL — ABNORMAL HIGH (ref 70–99)
Glucose-Capillary: 129 mg/dL — ABNORMAL HIGH (ref 70–99)
Glucose-Capillary: 111 mg/dL — ABNORMAL HIGH (ref 70–99)
Glucose-Capillary: 67 mg/dL — ABNORMAL LOW (ref 70–99)
Glucose-Capillary: 156 mg/dL — ABNORMAL HIGH (ref 70–99)
Glucose-Capillary: 121 mg/dL — ABNORMAL HIGH (ref 70–99)
Glucose-Capillary: 97 mg/dL (ref 70–99)

## 2019-01-08 LAB — C-REACTIVE PROTEIN: CRP: 19.4 mg/dL — ABNORMAL HIGH (ref <1.0)

## 2019-01-08 LAB — GLUCOSE, PLEURAL OR PERITONEAL FLUID: Glucose, Fluid: 95 mg/dL

## 2019-01-08 LAB — BODY FLUID CELL COUNT WITH DIFFERENTIAL
Eos, Fluid: 0 %
Lymphs, Fluid: 5 %
MONOCYTE-MACROPHAGE-SEROUS FLUID: 11 % — AB (ref 50–90)
Neutrophil Count, Fluid: 84 % — ABNORMAL HIGH (ref 0–25)
Total Nucleated Cell Count, Fluid: 2771 cu mm — ABNORMAL HIGH (ref 0–1000)

## 2019-01-08 LAB — COMPREHENSIVE METABOLIC PANEL
ALT: 49 U/L — ABNORMAL HIGH (ref 0–44)
AST: 60 U/L — ABNORMAL HIGH (ref 15–41)
Albumin: 2.8 g/dL — ABNORMAL LOW (ref 3.5–5.0)
Alkaline Phosphatase: 81 U/L (ref 38–126)
Anion gap: 10 (ref 5–15)
BUN: 11 mg/dL (ref 6–20)
CHLORIDE: 104 mmol/L (ref 98–111)
CO2: 24 mmol/L (ref 22–32)
Calcium: 8.3 mg/dL — ABNORMAL LOW (ref 8.9–10.3)
Creatinine, Ser: 0.64 mg/dL (ref 0.61–1.24)
GFR calc Af Amer: >60 mL/min (ref >60)
GFR calc non Af Amer: >60 mL/min (ref >60)
Glucose, Bld: 127 mg/dL — ABNORMAL HIGH (ref 70–99)
POTASSIUM: 3.3 mmol/L — AB (ref 3.5–5.1)
Sodium: 138 mmol/L (ref 135–145)
Total Bilirubin: 2 mg/dL — ABNORMAL HIGH (ref 0.3–1.2)
Total Protein: 5.6 g/dL — ABNORMAL LOW (ref 6.5–8.1)

## 2019-01-08 LAB — TYPE AND SCREEN
ABO/RH(D): A POS
Antibody Screen: NEGATIVE
Unit division: 0

## 2019-01-08 LAB — CBC WITH DIFFERENTIAL/PLATELET
Abs Immature Granulocytes: 0.21 10*3/uL — ABNORMAL HIGH (ref 0.00–0.07)
BASOS ABS: 0 10*3/uL (ref 0.0–0.1)
Basophils Relative: 0 %
Eosinophils Absolute: 0.2 10*3/uL (ref 0.0–0.5)
Eosinophils Relative: 1 %
HCT: 25.5 % — ABNORMAL LOW (ref 39.0–52.0)
Hemoglobin: 8.2 g/dL — ABNORMAL LOW (ref 13.0–17.0)
Immature Granulocytes: 2 %
Lymphocytes Relative: 8 %
Lymphs Abs: 1.1 10*3/uL (ref 0.7–4.0)
MCH: 31.3 pg (ref 26.0–34.0)
MCHC: 32.2 g/dL (ref 30.0–36.0)
MCV: 97.3 fL (ref 80.0–100.0)
Monocytes Absolute: 0.9 10*3/uL (ref 0.1–1.0)
Monocytes Relative: 6 %
NRBC: 0 % (ref 0.0–0.2)
Neutro Abs: 11.2 10*3/uL — ABNORMAL HIGH (ref 1.7–7.7)
Neutrophils Relative %: 83 %
Platelets: 530 10*3/uL — ABNORMAL HIGH (ref 150–400)
RBC: 2.62 MIL/uL — ABNORMAL LOW (ref 4.22–5.81)
RDW: 14.4 % (ref 11.5–15.5)
WBC: 12.7 10*3/uL — ABNORMAL HIGH (ref 4.0–10.5)

## 2019-01-08 LAB — GRAM STAIN

## 2019-01-08 LAB — PROTEIN, PLEURAL OR PERITONEAL FLUID: Total protein, fluid: 3.1 g/dL

## 2019-01-08 LAB — BPAM RBC
Blood Product Expiration Date: 202004132359
ISSUE DATE / TIME: 202003241219
Unit Type and Rh: 6200

## 2019-01-08 LAB — LACTATE DEHYDROGENASE, PLEURAL OR PERITONEAL FLUID: LD, Fluid: 496 U/L — ABNORMAL HIGH (ref 3–23)

## 2019-01-08 LAB — LACTIC ACID, PLASMA
Lactic Acid, Venous: 1.3 mmol/L (ref 0.5–1.9)
Lactic Acid, Venous: 1.3 mmol/L (ref 0.5–1.9)

## 2019-01-08 LAB — TRIGLYCERIDES: Triglycerides: 170 mg/dL — ABNORMAL HIGH (ref <150)

## 2019-01-08 LAB — MAGNESIUM: Magnesium: 2.3 mg/dL (ref 1.7–2.4)

## 2019-01-08 LAB — LACTATE DEHYDROGENASE: LDH: 393 U/L — ABNORMAL HIGH (ref 98–192)

## 2019-01-08 LAB — PHOSPHORUS: Phosphorus: 4.2 mg/dL (ref 2.5–4.6)

## 2019-01-08 LAB — PREPARE RBC (CROSSMATCH)

## 2019-01-08 MED ORDER — LIDOCAINE HCL 1 % IJ SOLN
INTRAMUSCULAR | Status: AC
  Filled 2019-01-08: qty 20

## 2019-01-08 MED ORDER — POTASSIUM CHLORIDE 10 MEQ/50ML IV SOLN
10.0000 meq | INTRAVENOUS | Status: AC
  Administered 2019-01-08 (×3): 10 meq via INTRAVENOUS
  Filled 2019-01-08 (×3): qty 50

## 2019-01-08 MED ORDER — SODIUM CHLORIDE 0.9 % IV BOLUS
500.0000 mL | Freq: Once | INTRAVENOUS | Status: AC
  Administered 2019-01-08: 500 mL via INTRAVENOUS

## 2019-01-08 MED ORDER — DIPHENHYDRAMINE HCL 25 MG PO CAPS
25.0000 mg | ORAL_CAPSULE | Freq: Once | ORAL | Status: AC
  Administered 2019-01-08: 25 mg via ORAL
  Filled 2019-01-08: qty 1

## 2019-01-08 MED ORDER — MORPHINE SULFATE (PF) 2 MG/ML IV SOLN
2.0000 mg | Freq: Once | INTRAVENOUS | Status: AC | PRN
  Administered 2019-01-08: 2 mg via INTRAVENOUS
  Filled 2019-01-08: qty 1

## 2019-01-08 MED ORDER — TRAVASOL 10 % IV SOLN
INTRAVENOUS | Status: AC
  Administered 2019-01-08: 18:00:00 via INTRAVENOUS
  Filled 2019-01-08: qty 900

## 2019-01-08 MED ORDER — DIPHENHYDRAMINE HCL 25 MG PO CAPS
25.0000 mg | ORAL_CAPSULE | Freq: Every evening | ORAL | Status: DC | PRN
  Administered 2019-01-08 – 2019-01-18 (×10): 25 mg via ORAL
  Filled 2019-01-08 (×10): qty 1

## 2019-01-08 NOTE — Procedures (Signed)
Ultrasound-guided diagnostic and therapeutic left thoracentesis performed yielding 640 cc of hazy, dark amber/tea colored fluid. No immediate complications. Follow-up chest x-ray pending. The fluid was sent to the lab for preordered studies. EBL none.

## 2019-01-08 NOTE — Progress Notes (Signed)
BP prn meds given.

## 2019-01-08 NOTE — Progress Notes (Signed)
PHARMACY - ADULT TOTAL PARENTERAL NUTRITION CONSULT NOTE   Pharmacy Consult for TPN Indication: severe pancreatitis  Patient Measurements: Height: 5\' 10"  (177.8 cm) Weight: 171 lb 1.2 oz (77.6 kg) IBW/kg (Calculated) : 73 TPN AdjBW (KG): 78 Body mass index is 24.55 kg/m. Usual Weight: 70.4 kg  Current Nutrition: NPO except for ice chips, sips, clear liquids   IVF: D5NS at 40 ml/hr (order dc'd 3/24, turned off 3/25 AM)  Central access: PICC placed 3/23 TPN start date: 3/23  ASSESSMENT                                                                                                          HPI: 34 yoM admitted on 3/14 with pancreatitis (precipitated by alcohol or hypertriglyceridemia).  Hypertriglyceridemia was treated with insulin drip.  Abdominal x-ray suggested early bowel obstruction.  NGT remains on suction.  Unable to tolerate clears.  Significant events:  3/21 Reglan added 3/22: TPN ordered but unable to start today d/t ordering cut off time of 12:00, PICC line was ordered, but not placed, concern with leukocytosis 3/23 Albumin x 4 doses  Today:    Glucose: No hx DM.  A1c 4.9 % on 3/16. CBGs in goal range of < 150  Electrolytes: K low (3.3) despite IV replacement daily, all others WNL including corrected Ca (9.26)  Renal: SCr WNL and stable, UOP 2785ml/24hr.   LFTs: slightly elevated but <2x ULN, Tbili 2.0  Prealbumin: 13.1 (3/17)  TGs: 375 (3/19), 260 (3/23), 203 (3/24), 170 (3/25). Off insulin drip 3/17.  I/O: - , NGT 1155ml/24hr.  Insulin Requirements: 5 units SSI  NUTRITIONAL GOALS                                                                                           RD recs (3/23): Kcal:  2100-2300 Protein:  100-110g Fluid:  2.1L/day  Custom TPN at goal rate of 80ml/hr provides: 102 g/day protein (50   g/L)  510 g/day Dextrose ( 25 %) 2142 Kcal/day  PLAN  Now: KCl IV x 4 runs  At 1800 today:  Advance TPN to 75 ml/hr. -  Provides 90 g of protein, 450 g of dextrose, and 0 g of lipids which provides 1890 kCals per day, meeting 88 % of goal kcal and goal AA  Electrolytes in TPN: Standard except continue decreased phos 12.5 mmol/L, increase K+ to 55 meq/L, Cl:Ac ratio 1:1  NO lipidsat this time(hold lipids per Dr. Chales Abrahams d/t elevated triglycerides/pancreatitis)  Plan to advance as tolerated to the goal rate.  TPN to contain standard multivitamins and trace elements.  IVF per MD (none currently)  CBG/SSI q4h .   TPN lab panels on Mondays & Thursdays.  F/u daily.  Loralee Pacas, PharmD, BCPS Pager: [phone] 01/08/2019,7:55 AM

## 2019-01-08 NOTE — Progress Notes (Signed)
PROGRESS NOTE    Charles Dougherty  MAU:[id number] DOB: 1991-06-29 DOA: 12/27/2018 PCP: Patient, No Pcp Per   Brief Narrative:  Patient is Charles Dougherty 28 year old male with history of alcohol abuse, substance abuse, smoker who presents to the emergencydepartment with complaints of abdominal pain. Admitted for the management of acute pancreatitis related to alcohol.Incidentally found to havesevere hypertriglyceridemia.He has positive family history.Hospital course remarkable for abdominal discomfort, distention .Abdominal x-ray suggested early bowel obstruction. General surgery consulted and he was started on conservative management with NG tube decompression, n.p.o. status. Unclear if the pancreatitis was precipitated by alcohol or hypertriglyceridemia.Due to severe hypertriglyceridemia,westarted on insulin drip and moved to stepdown. GI also consulted.  Assessment & Plan:   Principal Problem:   Acute necrotizing pancreatitis Active Problems:   Acute alcoholic pancreatitis   Pancreatitis   ETOH abuse   Drug abuse (HCC)   Cystitis   Ileus (HCC)   Hypertriglyceridemia   Abdominal pain   SBO (small bowel obstruction) (HCC)   Hypophosphatemia   Hypomagnesemia   Hypocalcemia   AKI (acute kidney injury) (HCC)   Anemia   Fever   Pneumonia of left lower lobe due to infectious organism [facility])   1 Acute necrotizing pancreatitis secondary to alcohol and hypertriglyceridemia  Ileus Presented 3/13 with elevated lipase and triglycerides >5000 Daily drinker CT from 3/14 with acute edematous interstitial pancreatitis 3/22 CT with evidence of necrotizing pancreatitis.  New free fluid and phlegmonous/inflammatory material surrounding the pancrease extending into bilateral paracolic gutters and pelvis (see report) NGT in place, LIS (~1.1 L out last 24 hrs).  Pt having BMs. TPN for nutrition GI following, appreciate recs  Currently on meropenem in setting of fevers  #.  Bilateral Pleural  Effusions (L>R)  Left lower lobe atelectasis/consolidation Noted on CXR from 3/25 Last fever 3/24 PM Blood and urine cx NGTD Leukocytosis present, but improving Negative urine strep and legionella Currently on meropenem for possible pneumonia and necrotizing pancreatitis Will c/s IR for thoracentesis  2.  Severe hypertriglyceridemia Patient with family history of hypertriglyceridemia. Patient was placed on the insulin drip with significant improvement with elevated triglyceride levels.   Triglycerides 170 today, follow daily Will need fibrate on d/c  3.  Small bowel obstruction versus ileus secondary to pancreatitis Patient with 1.1 L output from NG tube over the past 24 hours.   Abdominal films 3/24 with ileus vs SBO, suspect ileus more likely as pt having BM's Continue simethicone 160 mg p.o. 4 times daily.   Patient started on IV Reglan per gastroenterology.   Leave NG tube to suction per GI.   CT abdomen/pelvis as noted above  6.  Fever Likely 2/2 above.  Currently on abx for possible pneumonia and necrotizing pancreatitis.  Planning for thoracentesis per IR.   Blood and urine cx ngtd.  7.  Hypophosphatemia/hypokalemia Replace and follow.   8.  Anemia S/p 1 unit on 3/19 and 1 unit on 3/24  Likely dilutional and secondary to worsening pancreatitis.   Patient with no overt bleeding.   Anemia panel consistent with anemia of chronic disease/iron deficiency anemia.  FOBT negative.   Per GI upper endoscopy to be done in the outpatient setting.  GI following.  9.  Hypocalcemia/hypoalbuminemia Hypocalcemia corrects with albumin Continue to monitor with TPN   10.  Transaminitis Images done did not show any gallbladder stones or inflammation.  Patient noted to have diffuse fatty liver.  LFTs trended down.  Follow.    11.  Thrombocytopenia  Thrombocytosis Likely secondary to  alcoholic liver disease.  Lovenox discontinued.  No overt bleeding.  Now with elevated platelets.   12.  Suspected cystitis Per CT imaging.  Patient currently asymptomatic.  Urinalysis unremarkable.   13.  Acute kidney injury Resolved with hydration.  Follow.   14.  Hypoglycemic events 2/2 insulin gtt and NPO status. Now on TPN Follow   15 alcohol abuse No signs of withdrawal.  Alcohol cessation stressed to patient.  Social work consulted.  Follow.  16.  Malnutrition Patient presented with acute pancreatitis.  Has essentially been n.p.o. since admission.  Prealbumin of 7.0.  TPN ordered and started the evening of 01/06/2019.  Patient also placed on IV albumin every 6 hours x1 day.  GI following.  # PICC in place, imaging distal PICC tip flipped back and could be entering azygos vein.  Discussed with nursing, will discuss with PICC team and hold use of PICC for now.  # Insomnia: benadryl ordered for insomia prn (ambien/restoril have not helped)  DVT prophylaxis: SCD Code Status: full  Family Communication: none at bedside Disposition Plan: pending improvement   Consultants:   GI  IR  Procedures:   none  Antimicrobials:  Anti-infectives (From admission, onward)   Start     Dose/Rate Route Frequency Ordered Stop   01/06/19 1800  meropenem (MERREM) 1 g in sodium chloride 0.9 % 100 mL IVPB     1 g 200 mL/hr over 30 Minutes Intravenous Every 8 hours 01/06/19 1722     01/05/19 1800  levofloxacin (LEVAQUIN) IVPB 750 mg  Status:  Discontinued     750 mg 100 mL/hr over 90 Minutes Intravenous Every 24 hours 01/05/19 0954 01/05/19 1000   01/05/19 1800  levofloxacin (LEVAQUIN) IVPB 500 mg  Status:  Discontinued     500 mg 100 mL/hr over 60 Minutes Intravenous Every 24 hours 01/05/19 1012 01/05/19 1041   01/05/19 1130  levofloxacin (LEVAQUIN) IVPB 750 mg  Status:  Discontinued     750 mg 100 mL/hr over 90 Minutes Intravenous Daily 01/05/19 1041 01/06/19 1715   01/04/19 1800  levofloxacin (LEVAQUIN) IVPB 500 mg  Status:  Discontinued     500 mg 100 mL/hr over 60  Minutes Intravenous Every 24 hours 01/04/19 1612 01/05/19 0954     Subjective: C/o persistent pain. Difficulty sleeping last night.  Objective: Vitals:   01/08/19 0600 01/08/19 0700 01/08/19 0800 01/08/19 0900  BP: (!) 192/102 (!) 170/106 (!) 164/101 (!) 166/102  Pulse:      Resp: (!) 24 (!) 29 (!) 25 (!) 32  Temp:   98.7 F (37.1 C)   TempSrc:   Oral   SpO2: 96% 94% 95% 94%  Weight:      Height:        Intake/Output Summary (Last 24 hours) at 01/08/2019 1309 Last data filed at 01/08/2019 1220 Gross per 24 hour  Intake 2260.65 ml  Output 3120 ml  Net -859.35 ml   Filed Weights   01/06/19 0500 01/07/19 0351 01/08/19 0500  Weight: 77.3 kg 77.3 kg 77.6 kg    Examination:  General exam: Appears calm and comfortable  Respiratory system: unable to sit up due to pain.  Decreased breath sounds at bases. Cardiovascular system: S1 & S2 heard, RRR.  Gastrointestinal system: Abdomen is distended, soft and mildly TTP. Central nervous system: Alert and oriented. No focal neurological deficits. Extremities: Symmetric 5 x 5 power. Skin: No rashes, lesions or ulcers Psychiatry: Judgement and insight appear normal. Mood & affect appropriate.  Data Reviewed: I have personally reviewed following labs and imaging studies  CBC: Recent Labs  Lab 01/02/19 0951  01/04/19 0518 01/05/19 0548 01/06/19 0503 01/07/19 0830 01/07/19 1630 01/08/19 0515  WBC 10.0   < > 13.3* 14.8* 13.5* 13.0*  --  12.7*  NEUTROABS 8.1*  --  11.4*  --  11.1* 11.2*  --  11.2*  HGB 7.1*   < > 8.5* 8.0* 7.5* 6.6* 7.9* 8.2*  HCT 21.9*   < > 26.3* 23.8* 24.1* 20.5* 24.2* 25.5*  MCV 102.3*   < > 101.9* 102.6* 105.7* 99.5  --  97.3  PLT 109*   < > 213 316 410* 488*  --  530*   < > = values in this interval not displayed.   Basic Metabolic Panel: Recent Labs  Lab 01/02/19 0622 01/03/19 0320 01/04/19 0518 01/05/19 0548 01/06/19 0503 01/07/19 0314 01/08/19 0515  NA 135 135 133* 134* 133* 135 138  K  3.8 3.9 3.8 3.9 3.8 3.4* 3.3*  CL 103 104 100 102 102 103 104  CO2 21* 21* 23 24  GLUCOSE 118* 106* 102* 96 94 148* 127*  BUN CREATININE 0.61 0.63 0.65 0.70 0.69 0.67 0.64  CALCIUM 7.4* 7.8* 7.9* 7.8* 7.9* 8.0* 8.3*  MG  --  2.2  --  2.1 2.1 2.1 2.3  PHOS 2.8 3.8  --   --  4.2 3.4 4.2   GFR: Estimated Creatinine Clearance: 145.7 mL/min (by C-G formula based on SCr of 0.64 mg/dL). Liver Function Tests: Recent Labs  Lab 01/02/19 0622 01/03/19 0320 01/04/19 0518 01/06/19 0503 01/08/19 0515  AST 53* 50* 35 30 60*  ALT 41 40 31 27 49*  ALKPHOS 57 71 72 63 81  BILITOT 1.8* 1.9* 1.6* 1.6* 2.0*  PROT 4.8* 5.1* 5.2* 5.0* 5.6*  ALBUMIN 2.0* 2.1* 2.1* 2.0* 2.8*   Recent Labs  Lab 01/02/19 0622 01/06/19 0503  LIPASE 67* 47   No results for input(s): AMMONIA in the last 168 hours. Coagulation Profile: Recent Labs  Lab 01/06/19 0503  INR 1.3*   Cardiac Enzymes: No results for input(s): CKTOTAL, CKMB, CKMBINDEX, TROPONINI in the last 168 hours. BNP (last 3 results) No results for input(s): PROBNP in the last 8760 hours. HbA1C: No results for input(s): HGBA1C in the last 72 hours. CBG: Recent Labs  Lab 01/07/19 1953 01/07/19 2311 01/08/19 0447 01/08/19 0750 01/08/19 1228  GLUCAP 122* 128* 136* 129* 111*   Lipid Profile: Recent Labs    01/07/19 0830 01/08/19 0515  TRIG 203* 170*   Thyroid Function Tests: No results for input(s): TSH, T4TOTAL, FREET4, T3FREE, THYROIDAB in the last 72 hours. Anemia Panel: No results for input(s): VITAMINB12, FOLATE, FERRITIN, TIBC, IRON, RETICCTPCT in the last 72 hours. Sepsis Labs: Recent Labs  Lab 01/08/19 0515 01/08/19 0800  LATICACIDVEN 1.3 1.3    Recent Results (from the past 240 hour(s))  MRSA PCR Screening     Status: None   Collection Time: 12/30/18 11:24 AM  Result Value Ref Range Status   MRSA by PCR NEGATIVE NEGATIVE Final    Comment:        The GeneXpert MRSA Assay (FDA approved for  NASAL specimens only), is one component of Alayasia Breeding comprehensive MRSA colonization surveillance program. It is not intended to diagnose MRSA infection nor to guide or monitor treatment for MRSA infections. Performed at [facility], [address] [place], [place] [address]  Culture, blood (Routine X 2) w Reflex to ID Panel     Status: None (Preliminary result)   Collection Time: 01/05/19  9:05 AM  Result Value Ref Range Status   Specimen Description   Final    BLOOD RIGHT HAND Performed at [facility], [address] [place], [place] [address]    Special Requests   Final    BOTTLES DRAWN AEROBIC ONLY Blood Culture adequate volume Performed at [facility], [address] [place], [place] [address]    Culture   Final    NO GROWTH 3 DAYS Performed at [facility], [address], [place], [place] [address]    Report Status PENDING  Incomplete  Culture, blood (Routine X 2) w Reflex to ID Panel     Status: None (Preliminary result)   Collection Time: 01/05/19  9:05 AM  Result Value Ref Range Status   Specimen Description   Final    BLOOD LEFT HAND Performed at [facility], [address] [place], [place] [address]    Special Requests   Final    BOTTLES DRAWN AEROBIC ONLY Blood Culture adequate volume Performed at [facility], [address], [place], [place] [address]    Culture   Final    NO GROWTH 3 DAYS Performed at [facility], [address], [place], [place] [address]    Report Status PENDING  Incomplete  Culture, Urine     Status: None   Collection Time: 01/05/19 10:55 AM  Result Value Ref Range Status   Specimen Description   Final    URINE, CLEAN CATCH Performed at [facility], [address], [place], [place] [address]    Special Requests   Final    NONE Performed at [facility], [address],  [place], [place] [address]    Culture   Final    NO GROWTH Performed at [facility], [address] [place], [place] [address]    Report Status 01/06/2019 FINAL  Final         Radiology Studies: Dg Chest 2 View  Result Date: 01/08/2019 CLINICAL DATA:  Evaluate pleural effusion. EXAM: CHEST - 2 VIEW COMPARISON:  January 05, 2019 FINDINGS: The NG tube terminates in the left upper quadrant. Carine Nordgren small to moderate left-sided pleural effusion is stable with underlying opacity, likely atelectasis. Gerica Koble new left PICC line has been inserted. There is an unusual bend in the distal PICC line. The distal end could be flipped back on itself or extending into the azygos vein. Consider repositioning. There may be Yamaris Cummings small right pleural effusion as well. No other interval changes. IMPRESSION: 1. Small to moderate left-sided pleural effusion with underlying opacity, unchanged. 2. Probable small right effusion. 3. The left PICC line distal tip is flipped back on itself and could be entering the azygos vein. Consider repositioning. The NG tube is in good position. These results will be called to the ordering clinician or representative by the Radiologist Assistant, and communication documented in the PACS or zVision Dashboard. Electronically Signed   By: Gerome Sam III M.D   On: 01/08/2019 12:15   Dg Abd Portable 1v  Result Date: 01/07/2019 CLINICAL DATA:  Ileus EXAM: PORTABLE ABDOMEN - 1 VIEW COMPARISON:  January 06, 2019 FINDINGS: Nasogastric tube tip is at the superior most aspect of the image with tip in stomach. There remain multiple loops of dilated small bowel.  Eliyahu Bille few foci of air within colon noted. There is no longer contrast seen in the colon. No free air evident. IMPRESSION: Persistent small bowel dilatation. Question ileus versus Xyler Terpening degree of bowel obstruction. Nasogastric tube tip in stomach, seen along the superior most aspect of the image. No free air demonstrable. Electronically Signed   By: Bretta Bang III M.D.   On: 01/07/2019 10:47        Scheduled Meds: . Chlorhexidine Gluconate Cloth  6 each Topical Daily  . Chlorhexidine Gluconate Cloth  6 each Topical Daily  . insulin aspart  0-9 Units Subcutaneous Q4H  . metoCLOPramide (REGLAN) injection  10 mg Intravenous Q8H  . nicotine  14 mg Transdermal Daily  . pantoprazole (PROTONIX) IV  40 mg Intravenous Q12H  . simethicone  160 mg Oral QID  . sodium chloride flush  10-40 mL Intracatheter Q12H   Continuous Infusions: . sodium chloride Stopped (01/02/19 1042)  . meropenem (MERREM) IV Stopped (01/08/19 1144)  . TPN ADULT (ION) Stopped (01/08/19 1228)  . TPN ADULT (ION)       LOS: 11 days    Time spent: over 30 min    Lacretia Nicks, MD [facility] Pager AMION  If 7PM-7AM, please contact night-coverage [email or web address] Password [email or web address] 01/08/2019, 1:09 PM

## 2019-01-08 NOTE — Progress Notes (Addendum)
[facility] Progress Note  CC:   EtOH + hypertriglyceridemia pancreatitis   Subjective: He slept much better last night after her received Benadryl IV. Morphine does not relieve his abdominal pain. Toradol relieves pain better.   Objective:  Vital signs in last 24 hours: Temp:  [97.8 F (36.6 C)-101.1 F (38.4 C)] 98.3 F (36.8 C) (03/25 0400) Pulse Rate:  [97-108] 97 (03/24 1435) Resp:  [24-37] 29 (03/25 0700) BP: (147-192)/(87-106) 170/106 (03/25 0700) SpO2:  [92 %-99 %] 94 % (03/25 0700) Weight:  [77.6 kg] 77.6 kg (03/25 0500) Last BM Date: 01/07/19 General:   Alert and oriented in NAD Heart:  RRR, no murmur Pulm: Diminished LLL otherwise clear throughout Extremities:  LUE 1+ edema Neurologic:  Alert and  oriented x4;  grossly normal neurologically. Psych:  Alert and cooperative. Normal mood and affect.  Intake/Output from previous day: 03/24 0701 - 03/25 0700 In: 2246.6 [I.V.:1458; Blood:382.9; IV Piggyback:405.8] Out: 3825 [Urine:2725; Emesis/NG output:1100] Intake/Output this shift: Total I/O In: 955.1 [I.V.:784; IV Piggyback:171.1] Out: -   Lab Results: Recent Labs    01/06/19 0503 01/07/19 0830 01/07/19 1630 01/08/19 0515  WBC 13.5* 13.0*  --  12.7*  HGB 7.5* 6.6* 7.9* 8.2*  HCT 24.1* 20.5* 24.2* 25.5*  PLT 410* 488*  --  530*   BMET Recent Labs    01/06/19 0503 01/07/19 0314 01/08/19 0515  NA 133* 135 138  K 3.8 3.4* 3.3*  CL 102 103 104  CO2 21* 23 24  GLUCOSE 94 148* 127*  BUN 9 8 11   CREATININE 0.69 0.67 0.64  CALCIUM 7.9* 8.0* 8.3*   LFT Recent Labs    01/08/19 0515  PROT 5.6*  ALBUMIN 2.8*  AST 60*  ALT 49*  ALKPHOS 81  BILITOT 2.0*   PT/INR Recent Labs    01/06/19 0503  LABPROT 16.5*  INR 1.3*   Hepatitis Panel No results for input(s): HEPBSAG, HCVAB, HEPAIGM, HEPBIGM in the last 72 hours.  Dg Abd 2 Views  Result Date: 01/06/2019 CLINICAL DATA:  Ileus, enteric tube in place EXAM: ABDOMEN - 2 VIEW  COMPARISON:  01/05/2019 CT abdomen/pelvis and abdominal radiographs. FINDINGS: Enteric tube terminates in the body of the stomach. Marked diffuse small bowel dilatation up to 7.7 cm diameter, with air-fluid levels, not substantially changed. Retained oral contrast is noted throughout nondistended large bowel. No evidence of pneumatosis or pneumoperitoneum. Small bilateral pleural effusions, left greater than right, and bibasilar lung opacities appear unchanged. No radiopaque nephrolithiasis. IMPRESSION: No change in marked diffuse small bowel dilatation with air-fluid levels. Retained oral contrast throughout the nondistended large bowel. Findings are compatible with persistent severe adynamic ileus of the small bowel versus partial distal small bowel obstruction. Small bilateral pleural effusions, left greater than right, with bibasilar lung opacities, unchanged. Electronically Signed   By: Delbert Phenix M.D.   On: 01/06/2019 08:36   Dg Abd Portable 1v  Result Date: 01/07/2019 CLINICAL DATA:  Ileus EXAM: PORTABLE ABDOMEN - 1 VIEW COMPARISON:  January 06, 2019 FINDINGS: Nasogastric tube tip is at the superior most aspect of the image with tip in stomach. There remain multiple loops of dilated small bowel. A few foci of air within colon noted. There is no longer contrast seen in the colon. No free air evident. IMPRESSION: Persistent small bowel dilatation. Question ileus versus a degree of bowel obstruction. Nasogastric tube tip in stomach, seen along the superior most aspect of the image. No free air demonstrable. Electronically Signed  By: Bretta Bang III M.D.   On: 01/07/2019 10:47    Assessment / Plan:  1.  28 y.o. male with EtOH pancreatitis+ hypertriglyceridema. Repeat A/P CT 3/22 identified severe necrotizing pancreatitis with acute inflammatory/necrotic fluid collection with questionable infectious pancreatic necrosis. Temp 101.54F on 3/24. Temp 98.3 at 4am 3/25. Hypertensive: SBP 150-192. DBP  90's-106. HR 90-100's. Bc day 2 no growth. -Meropenem 1gm IV Q 8 hrs -TPN -IV Albumin 25gm IV Q 6 hrs -IVF 50cc/hr -Continue to monitor electrolytes and Triglyceride level -Continue to monitor temp and WBC -If continues to spike fever or if he fails to improve, willconsiderIR consultation fordrainage/cultures  2. Generalized abdominal pain secondary to # 1. Toradol more effective than Morphine. If patient has increased rectal bleeding will need to dc Toradol. Pain management with caution d/t hx polyp drug abuse. -pain management per hospitalist  3.Small bowelIleussecondary to pancreatitis. Repeat Abd/Pelvic CT 3/22  showed gaseous distention of the proximal jejunum, distended to approximately 5 cm diameter secondary to ileus. NGT drained 600cc bilious drainage last 24hrs. Patient passed 5 BMs yesterday, first BM had blood on TP, no further rectal bleeding.  -Continue NGT to LIS -Monitor abdominal distension, BMs   4. Anemia, dilutional, + rectal bleeding.Iron32. Received Feraheme IV  3/23.  Hg dropped to 6.6. He received 2 units PRBCs 3/24. Hg up to 8.2. HCT 25.5. -Transfuse for Hg < 7 -Continue to monitor rectal bleeding -No plans for EGD/colonoscopy at this time  5. Leukocytosisd/t pleural effusion vs pancreatitis? 3/23 WBC 13.5 with temp 1054F.A/P CTsee results above, CT alsoshows new bibasilar pleural effusions, left greater than right with associated compressive atelectasis. Blood cultures no growth 48 hrs. Urine cx negative. Pt is afebrile this am. WBC 12.7 today.  -Continue to monitor Temp and WBC -Await BC results  -Continue Meropenem IV   5. Hypertension multifactorial including pancreatitis/pneumonia/ abd pain.  SBP 150-192. DBP 90's-106. He received Labetolol  5mg  IV at [phone]. -management per hospitalist  --pain management per hospitalist   6. Malnutrition -continue TPN  7. Etoh abuse -no evidence of withddrawal     LOS: 11 days   Charles Dougherty  01/08/2019, 8:21 AM  I have taken an interval history, reviewed the chart and examined the patient. I agree with the Advanced Practitioner's note, impression and recommendations.   Have discussed with Dr Meridee Score, IR (Dr Lowella Dandy) and Dr Janee Morn (hospitalist).  Have reviewed the CT scans I have also done it independently.  28 year old with acuteseverenecrotizing pancreatitis with acute inflammatory/necrotic fluid collection(d/t ETOH/hypertriglyceridemia)complicated by ileus/pneumonia/pleural effusion s/p thoracocentesis (650 cc, tea colored, studies awaited) today.  Hb 8.2 after 1U PRBC. Had 5 BMs today. No hematochezia. Afeb on meropenam. On TPN (restarted after fixing the PICC line). CRP 19, TG 170, LDH 393 today  Plan: -Clamp NG tube.  Re-Trial of clear liquid diet today.  Minimize pain medications.  Monitor and correct electrolytes. -Continue TPN. -If unable to tolerate p.o., will ask radiology to advance the NG tube to the fourth portion of the duodenum or beyond and start trickle feeding. He does not want another tube/Dobb[place]. -Ambulate. -No intra-abdominal drainable fluid per IR. Can have diagnostic tap to rule out infection if needed in the future if he fails to improve. -CTA if any further bleeding/dropping hemoglobin rapidly to rule out pancreatic pseudoaneurysm. -Expect prolonged hospital course with potential of complications. -Patient has assured me he will not drink anymore alcohol. -Discussed with the patient's mother over the phone.  Edman Circle, MD

## 2019-01-08 NOTE — Progress Notes (Signed)
Pt had a very restless night last night, was started on a new sleeping medication Restoril, that increased nightmares. Page NP on call order given for po Benadryl, this seemed to have a better effect. The Pt experienced a lot of pain and pressure, walking in the hall, sitting up in bed, and a heat pack seemed to given some relief. I will continue to monitor.

## 2019-01-09 ENCOUNTER — Inpatient Hospital Stay (HOSPITAL_COMMUNITY): Payer: Federal, State, Local not specified - PPO

## 2019-01-09 LAB — COMPREHENSIVE METABOLIC PANEL
ALT: 80 U/L — ABNORMAL HIGH (ref 0–44)
ANION GAP: 7 (ref 5–15)
AST: 68 U/L — ABNORMAL HIGH (ref 15–41)
Albumin: 2.7 g/dL — ABNORMAL LOW (ref 3.5–5.0)
Alkaline Phosphatase: 98 U/L (ref 38–126)
BUN: 15 mg/dL (ref 6–20)
CO2: 22 mmol/L (ref 22–32)
Calcium: 8 mg/dL — ABNORMAL LOW (ref 8.9–10.3)
Chloride: 104 mmol/L (ref 98–111)
Creatinine, Ser: 0.54 mg/dL — ABNORMAL LOW (ref 0.61–1.24)
GFR calc Af Amer: >60 mL/min (ref >60)
GFR calc non Af Amer: >60 mL/min (ref >60)
Glucose, Bld: 137 mg/dL — ABNORMAL HIGH (ref 70–99)
Potassium: 4.3 mmol/L (ref 3.5–5.1)
Sodium: 133 mmol/L — ABNORMAL LOW (ref 135–145)
Total Bilirubin: 1.5 mg/dL — ABNORMAL HIGH (ref 0.3–1.2)
Total Protein: 5.6 g/dL — ABNORMAL LOW (ref 6.5–8.1)

## 2019-01-09 LAB — MAGNESIUM: Magnesium: 2.1 mg/dL (ref 1.7–2.4)

## 2019-01-09 LAB — CBC
HCT: 26.9 % — ABNORMAL LOW (ref 39.0–52.0)
Hemoglobin: 8.7 g/dL — ABNORMAL LOW (ref 13.0–17.0)
MCH: 31.9 pg (ref 26.0–34.0)
MCHC: 32.3 g/dL (ref 30.0–36.0)
MCV: 98.5 fL (ref 80.0–100.0)
NRBC: 0 % (ref 0.0–0.2)
Platelets: 572 10*3/uL — ABNORMAL HIGH (ref 150–400)
RBC: 2.73 MIL/uL — ABNORMAL LOW (ref 4.22–5.81)
RDW: 14.1 % (ref 11.5–15.5)
WBC: 14.6 10*3/uL — AB (ref 4.0–10.5)

## 2019-01-09 LAB — TRIGLYCERIDES: TRIGLYCERIDES: 129 mg/dL (ref <150)

## 2019-01-09 LAB — GLUCOSE, CAPILLARY
GLUCOSE-CAPILLARY: 127 mg/dL — AB (ref 70–99)
GLUCOSE-CAPILLARY: 141 mg/dL — AB (ref 70–99)
Glucose-Capillary: 127 mg/dL — ABNORMAL HIGH (ref 70–99)
Glucose-Capillary: 145 mg/dL — ABNORMAL HIGH (ref 70–99)
Glucose-Capillary: 148 mg/dL — ABNORMAL HIGH (ref 70–99)

## 2019-01-09 LAB — PHOSPHORUS: Phosphorus: 4 mg/dL (ref 2.5–4.6)

## 2019-01-09 LAB — PH, BODY FLUID: pH, Body Fluid: 7.5

## 2019-01-09 LAB — LACTATE DEHYDROGENASE: LDH: 368 U/L — ABNORMAL HIGH (ref 98–192)

## 2019-01-09 MED ORDER — TRAVASOL 10 % IV SOLN
INTRAVENOUS | Status: AC
  Administered 2019-01-09: 17:00:00 via INTRAVENOUS
  Filled 2019-01-09: qty 1020

## 2019-01-09 MED ORDER — AMLODIPINE BESYLATE 5 MG PO TABS
5.0000 mg | ORAL_TABLET | Freq: Every day | ORAL | Status: DC
  Administered 2019-01-09 – 2019-01-16 (×7): 5 mg via ORAL
  Filled 2019-01-09 (×7): qty 1

## 2019-01-09 MED ORDER — KETOROLAC TROMETHAMINE 30 MG/ML IJ SOLN
30.0000 mg | Freq: Four times a day (QID) | INTRAMUSCULAR | Status: DC | PRN
  Administered 2019-01-10 (×2): 30 mg via INTRAVENOUS
  Filled 2019-01-09 (×2): qty 1

## 2019-01-09 MED ORDER — BOOST / RESOURCE BREEZE PO LIQD CUSTOM
1.0000 | Freq: Three times a day (TID) | ORAL | Status: DC
  Administered 2019-01-09 – 2019-01-14 (×4): 1 via ORAL

## 2019-01-09 MED ORDER — LABETALOL HCL 5 MG/ML IV SOLN
5.0000 mg | INTRAVENOUS | Status: DC | PRN
  Administered 2019-01-15: 21:00:00 5 mg via INTRAVENOUS
  Filled 2019-01-09 (×2): qty 4

## 2019-01-09 NOTE — Progress Notes (Signed)
Pharmacy Antibiotic Note  Charles Dougherty is a 28 y.o. male admitted on 12/27/2018 with pancreatitis, now worsening. Antibiotics broadened to Meropenem. Pharmacy has been consulted to assist with dosing.  Plan: Meropenem 1g IV q8h Monitor renal function, cultures, clinical course.    Height: 5\' 10"  (177.8 cm) Weight: 171 lb 1.2 oz (77.6 kg) IBW/kg (Calculated) : 73  Temp (24hrs), Avg:100.1 F (37.8 C), Min:99.3 F (37.4 C), Max:101.6 F (38.7 C)  Recent Labs  Lab 01/05/19 0548 01/06/19 0503 01/07/19 0314 01/07/19 0830 01/08/19 0515 01/08/19 0800 01/09/19 0544  WBC 14.8* 13.5*  --  13.0* 12.7*  --  14.6*  CREATININE 0.70 0.69 0.67  --  0.64  --  0.54*  LATICACIDVEN  --   --   --   --  1.3 1.3  --     Estimated Creatinine Clearance: 145.7 mL/min (A) (by C-G formula based on SCr of 0.54 mg/dL (L)).    Allergies  Allergen Reactions  . Shellfish-Derived Products Nausea And Vomiting    *Mussels*    Antimicrobials this admission: 3/21 Levofloxacin >> 3/23 3/23 Meropenem >>   Dose adjustments this admission: --  Microbiology results: 3/14 UCx: NGF 3/16 MRSA PCR: negative  3/22 BCx: NGTD 3/22 UCx: NGF  3/25 Pleural fluid: no org seen  Thank you for allowing pharmacy to be a part of this patient's care.  Lynann Beaver PharmD, BCPS Pager [phone] 01/09/2019 9:27 AM

## 2019-01-09 NOTE — Progress Notes (Signed)
Patient stated increased abdominal bloated after Breez drink, Complaining of 12 small loose stools today. Stated abdomen appears more distended than yesterday. Pain was assessed at a 7 out of 10 with 3 being tolerable. Heart rate noted at 150 with ambulation.

## 2019-01-09 NOTE — Progress Notes (Addendum)
Nutrition Follow-up  RD working remotely.   DOCUMENTATION CODES:   Not applicable  INTERVENTION:  - Once PICC confirmed to be safely positioned, continue TPN per Pharmacy. - Will monitor for ability to begin TPN weaning. - Will order Boost Breeze TID, each supplement provides 250 kcal and 9 grams of protein. - Continue to advance diet as medically feasible and encourage PO intakes.    NUTRITION DIAGNOSIS:   Increased nutrient needs related to (ETOH pancreatitis) as evidenced by estimated needs. -ongoing  GOAL:   Patient will meet greater than or equal to 90% of their needs -met with TPN regimen  MONITOR:   PO intake, Supplement acceptance, Diet advancement, Weight trends, Skin, Other (Comment)(TPN regimen)  ASSESSMENT:   28 year old male with history of alcohol abuse, substance abuse, smoker who presents to the emergency department with complaints of abdominal pain. Admitted for the management of acute pancreatitis related to alcohol.    Weight has been stable over the past 4 days; will continue to monitor closely. Diet advanced from NPO to CLD yesterday at ~5:00 PM. RN flow sheet indicates 0% intake of dinner last night.   NGT remains in place. Notes indicate ~1.1L output from 3/24-3/25 and clamping trial began yesterday with advancement of diet. L-sided thoracentesis done yesterday with 640 ml hazy, tea-colored fluid removed.   Per Dr. Buford Dresser note yesterday afternoon: acute necrotizing pancreatitis 2/2 alcohol and hypertriglyceridemia (>5000 mg/dl on admission), ileus--having BM now, bilateral pleural effusions found on CXR 3/25 and s/p L thoracentesis, fever with possible PNA, anemia s/p 1 unit on 3/19 and 1 unit on 3/24, transaminitis with no gallbladder stones, suspected cystitis, AKI--resolved, use of PICC being held d/t possible malpositioning.   Double lumen PICC placed in L basilic on 2/72 with order for custom TPN without lipids (d/t pancreatitis) @ 85 ml/hr which  provides 2142 kcal, 102 grams of protein.      Medications reviewed; sliding scale novolog, 10 mg IV reglan TID, 40 mg IV protonix BID. Labs reviewed; CBGs: 127 mg/dl x2 today, Na: 133 mmol/l, creatinine: 0.54 mg/dl, Ca: 8 mg/dl, LFTs elevated and trending up, triglycerides: 129 mg/dl today.       Diet Order:   Diet Order            Diet clear liquid Room service appropriate? Yes; Fluid consistency: Thin  Diet effective now              EDUCATION NEEDS:   No education needs have been identified at this time  Skin:  Skin Assessment: Reviewed RN Assessment  Last BM:  3/25  Height:   Ht Readings from Last 1 Encounters:  12/30/18 '5\' 10"'  (1.778 m)    Weight:   Wt Readings from Last 1 Encounters:  01/08/19 77.6 kg    Ideal Body Weight:  75.5 kg  BMI:  Body mass index is 24.55 kg/m.  Estimated Nutritional Needs:   Kcal:  2100-2300  Protein:  100-110g  Fluid:  2.1L/day     Jarome Matin, MS, RD, LDN, CNSC Inpatient Clinical Dietitian Pager # [phone] After hours/weekend pager # [phone]

## 2019-01-09 NOTE — Progress Notes (Addendum)
[facility] Progress Note  CC:  EtOH  + hypertriglyceridemia pancreatitis   Subjective: Less abdominal pain overnight. Tolerating a clear liquid diet. NGT dc'd this morning. Had 2 loose BMs, no blood.   Objective:  Vital signs in last 24 hours: Temp:  [98.7 F (37.1 C)-101.6 F (38.7 C)] 99.3 F (37.4 C) (03/26 0326) Pulse Rate:  [98-113] 98 (03/26 0410) Resp:  [22-34] 27 (03/26 0410) BP: (138-170)/(89-108) 152/90 (03/26 0410) SpO2:  [94 %-100 %] 99 % (03/26 0410) Last BM Date: 01/08/19 General:   Alert,  Well-developed,    in NAD Heart:  Regular rate and rhythm; no murmurs Pulm: Diminished BS LLL otherwise clear Abdomen: Mildly distended, nontender, hypoactive BS x 4 quads   Extremities: LUE trace edema Neurologic:  Alert and  oriented x4;  grossly normal neurologically. Psych:  Alert and cooperative. Normal mood and affect.  Intake/Output from previous day: 03/25 0701 - 03/26 0700 In: 3098.4 [P.O.:300; I.V.:1699.1; IV Piggyback:1099.3] Out: 1100 [Urine:1000; Emesis/NG output:100] Intake/Output this shift: No intake/output data recorded.  Lab Results: Recent Labs    01/07/19 0830 01/07/19 1630 01/08/19 0515 01/09/19 0544  WBC 13.0*  --  12.7* 14.6*  HGB 6.6* 7.9* 8.2* 8.7*  HCT 20.5* 24.2* 25.5* 26.9*  PLT 488*  --  530* 572*   BMET Recent Labs    01/07/19 0314 01/08/19 0515 01/09/19 0544  NA 135 138 133*  K 3.4* 3.3* 4.3  CL 103 104 104  CO2 GLUCOSE 148* 127* 137*  BUN CREATININE 0.67 0.64 0.54*  CALCIUM 8.0* 8.3* 8.0*   LFT Recent Labs    01/09/19 0544  PROT 5.6*  ALBUMIN 2.7*  AST 68*  ALT 80*  ALKPHOS 98  BILITOT 1.5*   PT/INR No results for input(s): LABPROT, INR in the last 72 hours. Hepatitis Panel No results for input(s): HEPBSAG, HCVAB, HEPAIGM, HEPBIGM in the last 72 hours.  Dg Chest 1 View  Result Date: 01/08/2019 CLINICAL DATA:  Status post thoracentesis EXAM: CHEST  1 VIEW COMPARISON:   01/08/2019 FINDINGS: Cardiac shadow is stable. Gastric catheter is noted within the stomach. Left-sided PICC line is noted in the distal superior vena cava reposition when compared with the prior exam. Interval thoracentesis on the left is been performed with decrease in left-sided pleural effusion. Small residual effusions are noted bilaterally. No pneumothorax is seen. IMPRESSION: No pneumothorax following left thoracentesis. Small residual effusions are noted bilaterally. PICC line is better positioned when compared with the prior exam now in the distal superior vena cava. Electronically Signed   By: Alcide Clever M.D.   On: 01/08/2019 16:00   Dg Chest 2 View  Result Date: 01/08/2019 CLINICAL DATA:  Evaluate pleural effusion. EXAM: CHEST - 2 VIEW COMPARISON:  January 05, 2019 FINDINGS: The NG tube terminates in the left upper quadrant. A small to moderate left-sided pleural effusion is stable with underlying opacity, likely atelectasis. A new left PICC line has been inserted. There is an unusual bend in the distal PICC line. The distal end could be flipped back on itself or extending into the azygos vein. Consider repositioning. There may be a small right pleural effusion as well. No other interval changes. IMPRESSION: 1. Small to moderate left-sided pleural effusion with underlying opacity, unchanged. 2. Probable small right effusion. 3. The left PICC line distal tip is flipped back on itself and could be entering the azygos vein. Consider repositioning. The NG tube is in  good position. These results will be called to the ordering clinician or representative by the Radiologist Assistant, and communication documented in the PACS or zVision Dashboard. Electronically Signed   By: Gerome Sam III M.D   On: 01/08/2019 12:15   Dg Abd Portable 1v  Result Date: 01/07/2019 CLINICAL DATA:  Ileus EXAM: PORTABLE ABDOMEN - 1 VIEW COMPARISON:  January 06, 2019 FINDINGS: Nasogastric tube tip is at the superior most  aspect of the image with tip in stomach. There remain multiple loops of dilated small bowel. A few foci of air within colon noted. There is no longer contrast seen in the colon. No free air evident. IMPRESSION: Persistent small bowel dilatation. Question ileus versus a degree of bowel obstruction. Nasogastric tube tip in stomach, seen along the superior most aspect of the image. No free air demonstrable. Electronically Signed   By: Bretta Bang III M.D.   On: 01/07/2019 10:47   US Thoracentesis Asp Pleural Space W/img Guide  Result Date: 01/08/2019 INDICATION: Patient with history of abdominal pain, alcoholic pancreatitis, left pleural effusion. Request made for diagnostic and therapeutic left thoracentesis. EXAM: ULTRASOUND GUIDED DIAGNOSTIC AND THERAPEUTIC LEFT THORACENTESIS MEDICATIONS: None COMPLICATIONS: None immediate. PROCEDURE: An ultrasound guided thoracentesis was thoroughly discussed with the patient and questions answered. The benefits, risks, alternatives and complications were also discussed. The patient understands and wishes to proceed with the procedure. Written consent was obtained. Ultrasound was performed to localize and mark an adequate pocket of fluid in the left chest. The area was then prepped and draped in the normal sterile fashion. 1% Lidocaine was used for local anesthesia. Under ultrasound guidance a 6 Fr Safe-T-Centesis catheter was introduced. Thoracentesis was performed. The catheter was removed and a dressing applied. FINDINGS: A total of approximately 640 cc of hazy, dark amber/tea-colored fluid was removed. Samples were sent to the laboratory as requested by the clinical team. IMPRESSION: Successful ultrasound guided diagnostic and therapeutic left thoracentesis yielding 640 cc of pleural fluid. Read by: Jeananne Rama, PA-C Electronically Signed   By: Richarda Overlie M.D.   On: 01/08/2019 15:49    Assessment / Plan:  [age] y.o. male with EtOH pancreatitis+  hypertriglyceridema. Repeat A/P CT 3/22 identified severe necrotizing pancreatitis with acute inflammatory/necrotic fluid collection with questionable infectious pancreatic necrosis. Temp 101.6 3/25.  WBC 14.6. TGs 129.  -Meropenem 1gm IV Q 8 hrs -TPN -Clear liquid diet -IV Albumin 25gm IV Q 6 hrs -IVF 50cc/hr -Continue to monitor electrolytes and Triglyceride level -Continue to monitor temp and WBC -If continues to spike fever or if he fails to improve, willconsiderIR consultation fordrainage/cultures  2. Generalized abdominal pain secondary to # 1. Toradol more effective than Morphine. If patient has increased rectal bleeding will need to dc Toradol. Pain management with caution d/t hx polyp drug abuse. -continue PPI IV BID  -pain management per hospitalist  3.Small bowelIleussecondary to pancreatitis. Repeat Abd/Pelvic CT 3/22 showed gaseous distention of the proximal jejunum, distended to approximately 5 cm diametersecondary to ileus.NGT clamped, no significant drainage this am, NGT dc'd. Patient passed 2 BMs this morning, no blood. Frequently passing gas per the rectum. -Monitor abdominal distension, BMs  4. Anemia, dilutional, + rectal bleeding.Iron32. Received Feraheme IV 3/23.  Hg dropped to 6.6. He received 1 unit PRBC 3/24. Stable Hg 8.7. HCT 26.9.  -Transfuse for Hg < 7 -Continue to monitor rectal bleeding -No plans for EGD/colonoscopy at this time  5. Leukocytosisd/t pleural effusion vs pancreatitis.  Temp 101.6 3/25. WBC up to 14.6. A/P  CTsee results above, CT alsoshows new bibasilar pleural effusions, left greater than right with associated compressive atelectasis. Blood cultures no growth 48 hrs.Urine cx negative.[facility] 3/22 no growth 3 days.  S/P Thoracentesis 3/25 640cc amber fluid removed, cx, cytology results pending.  -Continue to monitor Temp and WBC -Await thoracentesis cx and cytology results  -Continue Meropenem IV  6. Elevated LFTs secondary  to pancreatitis. AST 68 >> 30. ALT 80 >> 27. T. Bili 1.5. 3/18 Acute hepatitis panel negative. -repeat hepatic panel 3/27  7. Hypertension multifactorial including pancreatitis/pneumonia/ abd pain. SBP 150-190's. DBP 90-100's. -management per hospitalist  -pain management per hospitalist   8. Malnutrition. Albumin 2.7. -continue TPN  9. Etoh abuse -no evidence of withddrawal        Attending physician's note   I have taken an interval history, reviewed the chart and examined the patient. I agree with the Advanced Practitioner's note, impression and recommendations.   28 year old with acuteseverenecrotizing pancreatitis with acute inflammatory/necrotic fluid collection(d/t ETOH/hypertriglyceridemia)complicated by ileus/pneumonia/pleural effusion s/p thoracocentesis (650 cc, tea colored, studies - exudate, cultures pending)  yesterday.  Had several bowel movements.  NG-tube has been removed.  He is tolerating clear liquids well.  Afebrile on meropenem.  Plan: -Continue clear liquids for another 24 hours.  If tolerates, advance to full liquids. -Continue TPN until oral intake picks up. -Ambulate. -Patient has assured me hewillnot drink anymore alcohol. -Discussed with the patient.  Dr. Orvan Falconer taking over the service from tomorrow onwards.  Edman Circle, MD

## 2019-01-09 NOTE — Progress Notes (Signed)
PROGRESS NOTE    Charles Dougherty  ZOX:[id number] DOB: 08/16/1991 DOA: 12/27/2018 PCP: Patient, No Pcp Per   Brief Narrative:  Patient is Charles Dougherty 28 year old male with history of alcohol abuse, substance abuse, smoker who presents to the emergencydepartment with complaints of abdominal pain. Admitted for the management of acute pancreatitis related to alcohol.Incidentally found to havesevere hypertriglyceridemia.He has positive family history.Hospital course remarkable for abdominal discomfort, distention .Abdominal x-ray suggested early bowel obstruction. General surgery consulted and he was started on conservative management with NG tube decompression, n.p.o. status. Unclear if the pancreatitis was precipitated by alcohol or hypertriglyceridemia.Due to severe hypertriglyceridemia,westarted on insulin drip and moved to stepdown. GI also consulted.  Assessment & Plan:   Principal Problem:   Acute necrotizing pancreatitis Active Problems:   Acute alcoholic pancreatitis   Pancreatitis   ETOH abuse   Drug abuse (HCC)   Cystitis   Ileus (HCC)   Hypertriglyceridemia   Abdominal pain   SBO (small bowel obstruction) (HCC)   Hypophosphatemia   Hypomagnesemia   Hypocalcemia   AKI (acute kidney injury) (HCC)   Anemia   Fever   Pneumonia of left lower lobe due to infectious organism [facility])   1 Acute necrotizing pancreatitis secondary to alcohol and hypertriglyceridemia   Ileus Presented 3/13 with elevated lipase and triglycerides >5000 Daily drinker CT from 3/14 with acute edematous interstitial pancreatitis 3/22 CT with evidence of necrotizing pancreatitis.  New free fluid and phlegmonous/inflammatory material surrounding the pancrease extending into bilateral paracolic gutters and pelvis (see report) PT having BM's.   Did well with clamped NG and clears yesterday.  NGT removed today. TPN for nutrition GI following, appreciate recs  Currently on meropenem in setting of fevers  #.   Bilateral Pleural Effusions (L>R)   Left lower lobe atelectasis/consolidation Noted on CXR from 3/25 Last fever 3/24 PM Blood and urine cx NGTD Leukocytosis present, but improving Negative urine strep and legionella Currently on meropenem for possible pneumonia and necrotizing pancreatitis S/p thoracentesis by IR on 3/26, with 640 cc hazy dark amber/tea colored fluid Exudative by light's.  Follow pending culture, pH, and cytology.  Possibly 2/2 acute pancreatitis?  2.  Severe hypertriglyceridemia Patient with family history of hypertriglyceridemia. Patient was placed on the insulin drip with significant improvement with elevated triglyceride levels.   Triglycerides 170 today, follow daily Will need fibrate on d/c  3.  Small bowel obstruction versus ileus secondary to pancreatitis Patient with 1.1 L output from NG tube over the past 24 hours.   Abdominal films 3/24 with ileus vs SBO, suspect ileus more likely as pt having BM's Continue simethicone 160 mg p.o. 4 times daily.   Patient started on IV Reglan per gastroenterology.   NG out today, continue clears CT abdomen/pelvis as noted above  6.  Fever Likely 2/2 above.  Currently on abx for possible pneumonia and necrotizing pancreatitis.   S/p thora as noted above Blood and urine cx ngtd. May need repeat imaging. Recurrent fever 3/25  7.  Hypophosphatemia/hypokalemia Replace and follow.   8.  Anemia S/p 1 unit on 3/19 and 1 unit on 3/24  Likely dilutional and secondary to worsening pancreatitis.   Patient with no overt bleeding.   Anemia panel consistent with anemia of chronic disease/iron deficiency anemia.  FOBT negative.   Per GI upper endoscopy to be done in the outpatient setting.  GI following.  9.  Hypocalcemia/hypoalbuminemia Hypocalcemia corrects with albumin Continue to monitor with TPN   10.  Transaminitis Images done did not  show any gallbladder stones or inflammation.  Patient noted to have diffuse fatty  liver.   LFT's now trending back up, continue to monitor closely  11.  Thrombocytopenia   Thrombocytosis Likely secondary to alcoholic liver disease.  Lovenox discontinued.  No overt bleeding.  Now with elevated platelets.  12.  Suspected cystitis Per CT imaging.  Patient currently asymptomatic.  Urinalysis unremarkable.   13.  Acute kidney injury Resolved with hydration.  Follow.   14.  Hypoglycemic events 2/2 insulin gtt and NPO status. Now on TPN Follow   15 alcohol abuse No signs of withdrawal.  Alcohol cessation stressed to patient.  Social work consulted.  Follow.  16.  Malnutrition Patient presented with acute pancreatitis.  Has essentially been n.p.o. since admission.  Prealbumin of 7.0.  TPN ordered and started the evening of 01/06/2019.  Patient also placed on IV albumin every 6 hours x1 day.  GI following.  # PICC in place, imaging distal PICC tip flipped back and could be entering azygos vein.  Now better positioned.  # Insomnia: benadryl ordered for insomia prn (ambien/restoril have not helped)  # Hypertension: start amlodipine  # Sinus Tachycardia: likely 2/2 above, continue to monitor  DVT prophylaxis: SCD Code Status: full  Family Communication: none at bedside Disposition Plan: pending improvement   Consultants:   GI  IR  Procedures:   none  Antimicrobials:  Anti-infectives (From admission, onward)   Start     Dose/Rate Route Frequency Ordered Stop   01/06/19 1800  meropenem (MERREM) 1 g in sodium chloride 0.9 % 100 mL IVPB     1 g 200 mL/hr over 30 Minutes Intravenous Every 8 hours 01/06/19 1722     01/05/19 1800  levofloxacin (LEVAQUIN) IVPB 750 mg  Status:  Discontinued     750 mg 100 mL/hr over 90 Minutes Intravenous Every 24 hours 01/05/19 0954 01/05/19 1000   01/05/19 1800  levofloxacin (LEVAQUIN) IVPB 500 mg  Status:  Discontinued     500 mg 100 mL/hr over 60 Minutes Intravenous Every 24 hours 01/05/19 1012 01/05/19 1041    01/05/19 1130  levofloxacin (LEVAQUIN) IVPB 750 mg  Status:  Discontinued     750 mg 100 mL/hr over 90 Minutes Intravenous Daily 01/05/19 1041 01/06/19 1715   01/04/19 1800  levofloxacin (LEVAQUIN) IVPB 500 mg  Status:  Discontinued     500 mg 100 mL/hr over 60 Minutes Intravenous Every 24 hours 01/04/19 1612 01/05/19 0954     Subjective: Pain better. Tolerated clears.   Having BM's  Objective: Vitals:   01/09/19 0326 01/09/19 0410 01/09/19 0756 01/09/19 0800  BP:  (!) 152/90  (!) 161/97  Pulse:  98    Resp:  (!) 27  (!) 28  Temp: 99.3 F (37.4 C)  99.9 F (37.7 C)   TempSrc: Oral  Oral   SpO2:  99%    Weight:      Height:        Intake/Output Summary (Last 24 hours) at 01/09/2019 0454 Last data filed at 01/09/2019 0600 Gross per 24 hour  Intake 2143.26 ml  Output 900 ml  Net 1243.26 ml   Filed Weights   01/06/19 0500 01/07/19 0351 01/08/19 0500  Weight: 77.3 kg 77.3 kg 77.6 kg    Examination:  General: No acute distress. Cardiovascular: Heart sounds show Chimamanda Siegfried regular rate, and rhythm.  Lungs: decreased at bases, normal wob Abdomen: Soft, nontender, distended Neurological: Alert and oriented 3. Moves all extremities 4. Cranial nerves  II through XII grossly intact. Skin: Warm and dry. No rashes or lesions. Extremities: No clubbing or cyanosis. No edema. Pedal pulses 2+. Psychiatric: Mood and affect are normal. Insight and judgment are appropriate.     Data Reviewed: I have personally reviewed following labs and imaging studies  CBC: Recent Labs  Lab 01/02/19 0951  01/04/19 0518 01/05/19 0548 01/06/19 0503 01/07/19 0830 01/07/19 1630 01/08/19 0515 01/09/19 0544  WBC 10.0   < > 13.3* 14.8* 13.5* 13.0*  --  12.7* 14.6*  NEUTROABS 8.1*  --  11.4*  --  11.1* 11.2*  --  11.2*  --   HGB 7.1*   < > 8.5* 8.0* 7.5* 6.6* 7.9* 8.2* 8.7*  HCT 21.9*   < > 26.3* 23.8* 24.1* 20.5* 24.2* 25.5* 26.9*  MCV 102.3*   < > 101.9* 102.6* 105.7* 99.5  --  97.3 98.5  PLT  109*   < > 213 316 410* 488*  --  530* 572*   < > = values in this interval not displayed.   Basic Metabolic Panel: Recent Labs  Lab 01/03/19 0320  01/05/19 0548 01/06/19 0503 01/07/19 0314 01/08/19 0515 01/09/19 0544  NA 135   < > 134* 133* 135 138 133*  K 3.9   < > 3.9 3.8 3.4* 3.3* 4.3  CL 104   < > 102 102 103 104 104  CO2 23   < > 21* 21* 23 24 22   GLUCOSE 106*   < > 96 94 148* 127* 137*  BUN 8   < > 10 9 8 11 15   CREATININE 0.63   < > 0.70 0.69 0.67 0.64 0.54*  CALCIUM 7.8*   < > 7.8* 7.9* 8.0* 8.3* 8.0*  MG 2.2  --  2.1 2.1 2.1 2.3 2.1  PHOS 3.8  --   --  4.2 3.4 4.2 4.0   < > = values in this interval not displayed.   GFR: Estimated Creatinine Clearance: 145.7 mL/min (Cullen L[place]ie) (by C-G formula based on SCr of 0.54 mg/dL (L)). Liver Function Tests: Recent Labs  Lab 01/03/19 0320 01/04/19 0518 01/06/19 0503 01/08/19 0515 01/09/19 0544  AST 50* 35 30 60* 68*  ALT 40 31 27 49* 80*  ALKPHOS 71 72 63 81 98  BILITOT 1.9* 1.6* 1.6* 2.0* 1.5*  PROT 5.1* 5.2* 5.0* 5.6* 5.6*  ALBUMIN 2.1* 2.1* 2.0* 2.8* 2.7*   Recent Labs  Lab 01/06/19 0503  LIPASE 47   No results for input(s): AMMONIA in the last 168 hours. Coagulation Profile: Recent Labs  Lab 01/06/19 0503  INR 1.3*   Cardiac Enzymes: No results for input(s): CKTOTAL, CKMB, CKMBINDEX, TROPONINI in the last 168 hours. BNP (last 3 results) No results for input(s): PROBNP in the last 8760 hours. HbA1C: No results for input(s): HGBA1C in the last 72 hours. CBG: Recent Labs  Lab 01/08/19 1929 01/08/19 2048 01/08/19 2318 01/09/19 0312 01/09/19 0724  GLUCAP 156* 121* 97 127* 127*   Lipid Profile: Recent Labs    01/08/19 0515 01/09/19 0544  TRIG 170* 129   Thyroid Function Tests: No results for input(s): TSH, T4TOTAL, FREET4, T3FREE, THYROIDAB in the last 72 hours. Anemia Panel: No results for input(s): VITAMINB12, FOLATE, FERRITIN, TIBC, IRON, RETICCTPCT in the last 72 hours. Sepsis Labs: Recent Labs   Lab 01/08/19 0515 01/08/19 0800  LATICACIDVEN 1.3 1.3    Recent Results (from the past 240 hour(s))  MRSA PCR Screening     Status: None   Collection Time:  12/30/18 11:24 AM  Result Value Ref Range Status   MRSA by PCR NEGATIVE NEGATIVE Final    Comment:        The GeneXpert MRSA Assay (FDA approved for NASAL specimens only), is one component of Shaynna Husby comprehensive MRSA colonization surveillance program. It is not intended to diagnose MRSA infection nor to guide or monitor treatment for MRSA infections. Performed at [facility], [address] [place], [place] [address]   Culture, blood (Routine X 2) w Reflex to ID Panel     Status: None (Preliminary result)   Collection Time: 01/05/19  9:05 AM  Result Value Ref Range Status   Specimen Description   Final    BLOOD RIGHT HAND Performed at [facility], [address] [place], [place] [address]    Special Requests   Final    BOTTLES DRAWN AEROBIC ONLY Blood Culture adequate volume Performed at [facility], [address], [place], [place] [address]    Culture   Final    NO GROWTH 3 DAYS Performed at [facility], [address] [place], [place] [address]    Report Status PENDING  Incomplete  Culture, blood (Routine X 2) w Reflex to ID Panel     Status: None (Preliminary result)   Collection Time: 01/05/19  9:05 AM  Result Value Ref Range Status   Specimen Description   Final    BLOOD LEFT HAND Performed at [facility], [address], [place], [place] [address]    Special Requests   Final    BOTTLES DRAWN AEROBIC ONLY Blood Culture adequate volume Performed at [facility], [address] [place], [place] [address]    Culture   Final    NO GROWTH 3 DAYS Performed at [facility], [address], [place], [place] [address]    Report Status PENDING  Incomplete  Culture, Urine     Status: None   Collection  Time: 01/05/19 10:55 AM  Result Value Ref Range Status   Specimen Description   Final    URINE, CLEAN CATCH Performed at [facility], [address], [place], [place] [address]    Special Requests   Final    NONE Performed at [facility], [address], [place], [place] [address]    Culture   Final    NO GROWTH Performed at [facility], [address], [place], [place] [address]    Report Status 01/06/2019 FINAL  Final  Gram stain     Status: None   Collection Time: 01/08/19  3:48 PM  Result Value Ref Range Status   Specimen Description PLEURAL  Final   Special Requests NONE  Final   Gram Stain   Final    MODERATE WBC PRESENT, PREDOMINANTLY PMN NO ORGANISMS SEEN Performed at [facility], [address], [place], [place] [address]    Report Status 01/08/2019 FINAL  Final         Radiology Studies: Dg Chest 1 View  Result Date: 01/08/2019 CLINICAL DATA:  Status post thoracentesis EXAM: CHEST  1 VIEW COMPARISON:  01/08/2019 FINDINGS: Cardiac shadow is stable. Gastric catheter is noted within the stomach. Left-sided PICC line is noted in the distal superior vena cava reposition when compared with the prior exam. Interval thoracentesis on the left is been performed with decrease in left-sided pleural effusion. Small residual effusions are noted bilaterally. No pneumothorax  is seen. IMPRESSION: No pneumothorax following left thoracentesis. Small residual effusions are noted bilaterally. PICC line is better positioned when compared with the prior exam now in the distal superior vena cava. Electronically Signed   By: Alcide Clever M.D.   On: 01/08/2019 16:00   Dg Chest 2 View  Result Date: 01/08/2019 CLINICAL DATA:  Evaluate pleural effusion. EXAM: CHEST - 2 VIEW COMPARISON:  January 05, 2019 FINDINGS: The NG tube terminates in the left upper quadrant. Leanthony Rhett small to moderate left-sided pleural effusion is stable with underlying opacity,  likely atelectasis. Arnel Wymer new left PICC line has been inserted. There is an unusual bend in the distal PICC line. The distal end could be flipped back on itself or extending into the azygos vein. Consider repositioning. There may be Rakin Lemelle small right pleural effusion as well. No other interval changes. IMPRESSION: 1. Small to moderate left-sided pleural effusion with underlying opacity, unchanged. 2. Probable small right effusion. 3. The left PICC line distal tip is flipped back on itself and could be entering the azygos vein. Consider repositioning. The NG tube is in good position. These results will be called to the ordering clinician or representative by the Radiologist Assistant, and communication documented in the PACS or zVision Dashboard. Electronically Signed   By: Gerome Sam III M.D   On: 01/08/2019 12:15   Dg Abd Portable 1v  Result Date: 01/07/2019 CLINICAL DATA:  Ileus EXAM: PORTABLE ABDOMEN - 1 VIEW COMPARISON:  January 06, 2019 FINDINGS: Nasogastric tube tip is at the superior most aspect of the image with tip in stomach. There remain multiple loops of dilated small bowel. Shalon Councilman few foci of air within colon noted. There is no longer contrast seen in the colon. No free air evident. IMPRESSION: Persistent small bowel dilatation. Question ileus versus Ayumi Wangerin degree of bowel obstruction. Nasogastric tube tip in stomach, seen along the superior most aspect of the image. No free air demonstrable. Electronically Signed   By: Bretta Bang III M.D.   On: 01/07/2019 10:47   US Thoracentesis Asp Pleural Space W/img Guide  Result Date: 01/08/2019 INDICATION: Patient with history of abdominal pain, alcoholic pancreatitis, left pleural effusion. Request made for diagnostic and therapeutic left thoracentesis. EXAM: ULTRASOUND GUIDED DIAGNOSTIC AND THERAPEUTIC LEFT THORACENTESIS MEDICATIONS: None COMPLICATIONS: None immediate. PROCEDURE: An ultrasound guided thoracentesis was thoroughly discussed with the patient and  questions answered. The benefits, risks, alternatives and complications were also discussed. The patient understands and wishes to proceed with the procedure. Written consent was obtained. Ultrasound was performed to localize and mark an adequate pocket of fluid in the left chest. The area was then prepped and draped in the normal sterile fashion. 1% Lidocaine was used for local anesthesia. Under ultrasound guidance Truc Winfree 6 Fr Safe-T-Centesis catheter was introduced. Thoracentesis was performed. The catheter was removed and Ricky Doan dressing applied. FINDINGS: Onesimo Lingard total of approximately 640 cc of hazy, dark amber/tea-colored fluid was removed. Samples were sent to the laboratory as requested by the clinical team. IMPRESSION: Successful ultrasound guided diagnostic and therapeutic left thoracentesis yielding 640 cc of pleural fluid. Read by: Jeananne Rama, PA-C Electronically Signed   By: Richarda Overlie M.D.   On: 01/08/2019 15:49        Scheduled Meds:  Chlorhexidine Gluconate Cloth  6 each Topical Daily   Chlorhexidine Gluconate Cloth  6 each Topical Daily   feeding supplement  1 Container Oral TID BM   insulin aspart  0-9 Units Subcutaneous Q4H   metoCLOPramide (REGLAN) injection  10 mg Intravenous Q8H   nicotine  14 mg Transdermal Daily   pantoprazole (PROTONIX) IV  40 mg Intravenous Q12H   simethicone  160 mg Oral QID   sodium chloride flush  10-40 mL Intracatheter Q12H   Continuous Infusions:  sodium chloride 1,000 mL (01/08/19 1645)   meropenem (MERREM) IV 1 g (01/09/19 0806)   TPN ADULT (ION) 75 mL/hr at 01/08/19 1848     LOS: 12 days    Time spent: over 30 min    Lacretia Nicks, MD [facility] Pager AMION  If 7PM-7AM, please contact night-coverage [email or web address] Password [email or web address] 01/09/2019, 8:22 AM

## 2019-01-09 NOTE — Progress Notes (Signed)
Pt brought to the unit via wheelchair with ICU staff present. Pt denies pain at this time with no s/s of distress noted. Pt orientated to the room and unit policies. Pts assessment is unchanged from earlier documentation. Will continue to monitor.

## 2019-01-09 NOTE — Evaluation (Signed)
Physical Therapy Evaluation Patient Details Name: Charles Dougherty MRN: [id number] DOB: [birth year] Today's Date: 01/09/2019   History of Present Illness  28 yo male admitted with acute necrotizing pancreatitis. Hx of ETOH abuse, drug abuse  Clinical Impression  On eval, pt was Supv level for mobility. He walked ~400 feet with a RW (at pt's request). He is motivated to mobilize and d/c home. Will continue to follow and progress activity as tolerated.     Follow Up Recommendations No PT follow up;Supervision - Intermittent    Equipment Recommendations  None recommended by PT    Recommendations for Other Services       Precautions / Restrictions Precautions Precautions: Fall Restrictions Weight Bearing Restrictions: No      Mobility  Bed Mobility Overal bed mobility: Modified Independent                Transfers Overall transfer level: Modified independent Equipment used: Rolling walker (2 wheeled)                Ambulation/Gait Ambulation/Gait assistance: Supervision Gait Distance (Feet): 400 Feet Assistive device: Rolling walker (2 wheeled) Gait Pattern/deviations: Step-through pattern     General Gait Details: Pt requested to use the RW on today for safety. Dyspnea 2/4  Stairs            Wheelchair Mobility    Modified Rankin (Stroke Patients Only)       Balance                                             Pertinent Vitals/Pain Pain Assessment: No/denies pain    Home Living Family/patient expects to be discharged to:: Private residence Living Arrangements: Spouse/significant other;Parent;Other relatives   Type of Home: House Home Access: Stairs to enter   Entrance Stairs-Number of Steps: 1   Home Equipment: None      Prior Function Level of Independence: Independent               Hand Dominance        Extremity/Trunk Assessment   Upper Extremity Assessment Upper Extremity Assessment: Overall WFL  for tasks assessed    Lower Extremity Assessment Lower Extremity Assessment: Generalized weakness    Cervical / Trunk Assessment Cervical / Trunk Assessment: Normal  Communication   Communication: No difficulties  Cognition Arousal/Alertness: Awake/alert Behavior During Therapy: WFL for tasks assessed/performed Overall Cognitive Status: Within Functional Limits for tasks assessed                                        General Comments      Exercises     Assessment/Plan    PT Assessment Patient needs continued PT services  PT Problem List Decreased mobility       PT Treatment Interventions Gait training;Functional mobility training;Patient/family education;Therapeutic activities;Therapeutic exercise    PT Goals (Current goals can be found in the Care Plan section)  Acute Rehab PT Goals Patient Stated Goal: to regain PLOF PT Goal Formulation: With patient Time For Goal Achievement: 01/23/19 Potential to Achieve Goals: Good    Frequency Min 3X/week   Barriers to discharge        Co-evaluation               AM-PAC PT "6 Clicks" Mobility  Outcome Measure Help needed turning from your back to your side while in a flat bed without using bedrails?: None Help needed moving from lying on your back to sitting on the side of a flat bed without using bedrails?: None Help needed moving to and from a bed to a chair (including a wheelchair)?: None Help needed standing up from a chair using your arms (e.g., wheelchair or bedside chair)?: None Help needed to walk in hospital room?: A Little Help needed climbing 3-5 steps with a railing? : A Little 6 Click Score: 22    End of Session Equipment Utilized During Treatment: Gait belt Activity Tolerance: Patient tolerated treatment well Patient left: in chair;with call bell/phone within reach   PT Visit Diagnosis: Unsteadiness on feet (R26.81)    Time: 5521-7471 PT Time Calculation (min) (ACUTE ONLY):  13 min   Charges:   PT Evaluation $PT Eval Moderate Complexity: 1 Mod           Rebeca Alert, PT Acute Rehabilitation Services Pager: [phone] Office: [phone]

## 2019-01-09 NOTE — Progress Notes (Addendum)
PHARMACY - ADULT TOTAL PARENTERAL NUTRITION CONSULT NOTE   Pharmacy Consult for TPN Indication: severe pancreatitis  Patient Measurements: Height: 5\' 10"  (177.8 cm) Weight: 171 lb 1.2 oz (77.6 kg) IBW/kg (Calculated) : 73 TPN AdjBW (KG): 78 Body mass index is 24.55 kg/m. Usual Weight: 70.4 kg  Current Nutrition: clear liquids  - Boost Breeze TID, each supplement provides 250 kcal and 9 grams of protein  IVF: none  Central access: PICC placed 3/23 TPN start date: 3/23  ASSESSMENT                                                                                                          HPI: 65 yoM admitted on 3/14 with pancreatitis (precipitated by alcohol or hypertriglyceridemia).  Hypertriglyceridemia was treated with insulin drip.  Abdominal x-ray suggested early bowel obstruction.  NGT remains on suction.  Unable to tolerate clears.  Significant events:  3/21 Reglan added 3/22: TPN ordered but unable to start today d/t ordering cut off time of 12:00, PICC line was ordered, but not placed, concern with leukocytosis 3/23 Albumin x 4 doses 3/25 bilateral pleural effusions s/p L thoracentesis, out.  PICC with possible malpositioning - held use of PICC line temporarily.  TPN not documented as held.  PICC positioning corrected on imaging at 15:55 post thoracentesis. 3/26 NGT removed, advancing diet.  Today:   Glucose: No hx DM.  A1c 4.9 % on 3/16. CBGs in goal range of < 150 (67-156)  Electrolytes: Na low.  K improved to WNL.  All others WNL including corrected Ca (9.04)  Renal: SCr WNL and stable  LFTs: slightly elevated and increased.  AST/ALT 68/80 but <2x ULN, Tbili dec to 1.5  Prealbumin: 13.1 (3/17)  TGs: 375 (3/19), 260 (3/23), 203 (3/24), 170 (3/25), 129 (3/26)  Admission TG>5000 (3/16), off insulin drip 3/17.  I/O: +2L, NGT 168ml/24hr.  Insulin Requirements: 3 units SSI  NUTRITIONAL GOALS                                                                                            RD recs (3/23): Kcal:  2100-2300 Protein:  100-110g Fluid:  2.1L/day  Custom TPN at goal rate of 46ml/hr provides: 102 g/day protein (50   g/L)  510 g/day Dextrose ( 25 %)  Glucose infusion rate will be 4.56 mg/kg/min (Maximum 5 mg/kg/min) 2142 Kcal/day    PLAN  Now:   At 1800 today:  Advance custom TPN to 85 ml/hr, goal rate  Electrolytes in TPN: Increase to Na 60 mEq/L, continue continue decreased phos 12.5 mmol/L, continue increase K 55 meq/L, continue standard Mag, Ca. Continue Cl:Ac ratio 1:1  NO lipidsat this time(hold lipids per Dr. Chales Abrahams d/t elevated triglycerides/pancreatitis)  TPN to contain standard multivitamins and trace elements.  IVF per MD (none currently)  CBG/SSI q4h .   Monitor CBGs as glucose infusion rate is near upper end of max limit.  TPN lab panels on Mondays & Thursdays.  BMET, Mag, Phos with AM labs.  F/u daily.  F/u intake of enteral diet.   Lynann Beaver PharmD, BCPS Pager [phone] 01/09/2019 9:01 AM

## 2019-01-10 ENCOUNTER — Inpatient Hospital Stay (HOSPITAL_COMMUNITY): Payer: Federal, State, Local not specified - PPO

## 2019-01-10 LAB — CULTURE, BLOOD (ROUTINE X 2)
Culture: NO GROWTH
Culture: NO GROWTH
Special Requests: ADEQUATE
Special Requests: ADEQUATE

## 2019-01-10 LAB — COMPREHENSIVE METABOLIC PANEL
ALT: 128 U/L — ABNORMAL HIGH (ref 0–44)
AST: 104 U/L — ABNORMAL HIGH (ref 15–41)
Albumin: 2.5 g/dL — ABNORMAL LOW (ref 3.5–5.0)
Alkaline Phosphatase: 86 U/L (ref 38–126)
Anion gap: 8 (ref 5–15)
BUN: 14 mg/dL (ref 6–20)
CO2: 21 mmol/L — ABNORMAL LOW (ref 22–32)
Calcium: 8.2 mg/dL — ABNORMAL LOW (ref 8.9–10.3)
Chloride: 106 mmol/L (ref 98–111)
Creatinine, Ser: 0.49 mg/dL — ABNORMAL LOW (ref 0.61–1.24)
GFR calc Af Amer: >60 mL/min (ref >60)
GFR calc non Af Amer: >60 mL/min (ref >60)
Glucose, Bld: 135 mg/dL — ABNORMAL HIGH (ref 70–99)
Potassium: 4.2 mmol/L (ref 3.5–5.1)
SODIUM: 135 mmol/L (ref 135–145)
Total Bilirubin: 1.7 mg/dL — ABNORMAL HIGH (ref 0.3–1.2)
Total Protein: 5.7 g/dL — ABNORMAL LOW (ref 6.5–8.1)

## 2019-01-10 LAB — GLUCOSE, CAPILLARY
GLUCOSE-CAPILLARY: 142 mg/dL — AB (ref 70–99)
Glucose-Capillary: 121 mg/dL — ABNORMAL HIGH (ref 70–99)
Glucose-Capillary: 136 mg/dL — ABNORMAL HIGH (ref 70–99)
Glucose-Capillary: 141 mg/dL — ABNORMAL HIGH (ref 70–99)
Glucose-Capillary: 145 mg/dL — ABNORMAL HIGH (ref 70–99)

## 2019-01-10 LAB — CBC
HCT: 26.7 % — ABNORMAL LOW (ref 39.0–52.0)
Hemoglobin: 7.7 g/dL — ABNORMAL LOW (ref 13.0–17.0)
MCH: 31.4 pg (ref 26.0–34.0)
MCHC: 28.8 g/dL — ABNORMAL LOW (ref 30.0–36.0)
MCV: 109 fL — ABNORMAL HIGH (ref 80.0–100.0)
Platelets: 580 10*3/uL — ABNORMAL HIGH (ref 150–400)
RBC: 2.45 MIL/uL — AB (ref 4.22–5.81)
RDW: 14.6 % (ref 11.5–15.5)
WBC: 12.3 10*3/uL — ABNORMAL HIGH (ref 4.0–10.5)
nRBC: 0 % (ref 0.0–0.2)

## 2019-01-10 LAB — MAGNESIUM: Magnesium: 2 mg/dL (ref 1.7–2.4)

## 2019-01-10 LAB — AMYLASE, PLEURAL OR PERITONEAL FLUID: Amylase, Fluid: 24 U/L

## 2019-01-10 LAB — PHOSPHORUS: Phosphorus: 3.6 mg/dL (ref 2.5–4.6)

## 2019-01-10 MED ORDER — DIPHENHYDRAMINE HCL 50 MG/ML IJ SOLN
25.0000 mg | Freq: Once | INTRAMUSCULAR | Status: AC
  Administered 2019-01-10: 25 mg via INTRAVENOUS
  Filled 2019-01-10: qty 1

## 2019-01-10 MED ORDER — IOHEXOL 300 MG/ML  SOLN
100.0000 mL | Freq: Once | INTRAMUSCULAR | Status: AC | PRN
  Administered 2019-01-10: 100 mL via INTRAVENOUS

## 2019-01-10 MED ORDER — SODIUM CHLORIDE (PF) 0.9 % IJ SOLN
INTRAMUSCULAR | Status: AC
  Administered 2019-01-14: 10 mL
  Filled 2019-01-10: qty 50

## 2019-01-10 MED ORDER — MORPHINE SULFATE (PF) 2 MG/ML IV SOLN
2.0000 mg | INTRAVENOUS | Status: DC | PRN
  Administered 2019-01-10 – 2019-01-12 (×11): 2 mg via INTRAVENOUS
  Filled 2019-01-10 (×11): qty 1

## 2019-01-10 MED ORDER — TRAVASOL 10 % IV SOLN
INTRAVENOUS | Status: AC
  Administered 2019-01-10: 17:00:00 via INTRAVENOUS
  Filled 2019-01-10: qty 1020

## 2019-01-10 MED ORDER — INSULIN ASPART 100 UNIT/ML ~~LOC~~ SOLN
0.0000 [IU] | Freq: Four times a day (QID) | SUBCUTANEOUS | Status: DC
  Administered 2019-01-10 (×2): 1 [IU] via SUBCUTANEOUS

## 2019-01-10 NOTE — Progress Notes (Signed)
Insertion of NG 24fr to Rt nares successful with immediate return of golden yellow gastric secretions. Placement secured & NG on low intermittent suction. Follow up Xray pending for placement verification.

## 2019-01-10 NOTE — Progress Notes (Signed)
PHARMACY - ADULT TOTAL PARENTERAL NUTRITION CONSULT NOTE   Pharmacy Consult for TPN Indication: severe pancreatitis  Patient Measurements: Height: 5\' 10"  (177.8 cm) Weight: 171 lb 1.2 oz (77.6 kg) IBW/kg (Calculated) : 73 TPN AdjBW (KG): 78 Body mass index is 24.55 kg/m. Usual Weight: 70.4 kg  Insulin Requirements: on sSSI; 6 units in the past 24 hrs  Current Nutrition: clear liquids  - Boost Breeze TID, each supplement provides 250 kcal and 9 grams of protein  IVF: none  Central access: PICC placed 3/23 TPN start date: 3/23  ASSESSMENT                                                                                                          HPI: 39 yoM admitted on 3/14 with pancreatitis (precipitated by alcohol or hypertriglyceridemia).  Hypertriglyceridemia was treated with insulin drip.  Abdominal x-ray suggested early bowel obstruction.  NGT remains on suction.  Unable to tolerate clears.  Significant events:  3/21 Reglan added 3/22: TPN ordered but unable to start today d/t ordering cut off time of 12:00, PICC line was ordered, but not placed, concern with leukocytosis 3/23 Albumin x 4 doses 3/25 bilateral pleural effusions s/p L thoracentesis, out.  PICC with possible malpositioning - held use of PICC line temporarily.  TPN not documented as held.  PICC positioning corrected on imaging at 15:55 post thoracentesis. 3/26 NGT removed, advancing diet. 3/27: spoke to [facility] from GI team, ok to add lipid to TPN  Today:  - Glucose (goal <150): No hx DM.  A1c 4.9 % on 3/16. CBGs in goal range  - Electrolytes: WNL; phos wnl but trending down - Renal: SCr stable - LFTs: AST/ALT trending up 104/128; Tbili labile at 1.7 - Prealbumin: 13.1 (3/17) - TGs: Admission TG>5000 (3/16), off insulin drip 3/17.  No lipid added to TPN since 3/23 375 (3/19), 260 (3/23), 203 (3/24), 170 (3/25), 129 (3/26)   NUTRITIONAL GOALS                                                                                            RD recs (3/26): Kcal:  2100-2300 Protein:  100-110g Fluid:  2.1L/day  Custom TPN at goal rate of 17ml/hr provides: - Protein: 102 g/day  (50  gm/L) - Lipid: 51 gm/day (25 gm/L) --> 28% - Dextrose: 388 g/day (19 %) - Total kcal: 2236 kcal/day   PLAN  At 1800 today:  Continue TPN with lipid at goal rate 85 ml/hr; meeting 100% of AA and kcal needs  Electrolytes in TPN: standard lytes except --> continue increased Na 60 mEq/L, increase phos to 15 mmol/L, continue increased K 55 meq/L; Continue Cl:Ac ratio 1:1  TPN to contain standard multivitamins and trace elements.  IVF per MD (none currently)  Change CBG/SSI to q6h .   TPN lab panels on Mondays & Thursdays.  CMET, Mag, Phos with AM labs. TG daily ordered per GI team  F/u daily.  F/u intake of enteral diet.   Dorna Leitz, PharmD, BCPS 01/10/2019 7:30 AM

## 2019-01-10 NOTE — Progress Notes (Signed)
Physical Therapy Treatment Patient Details Name: Charles Dougherty MRN: [id number] DOB: Jan 16, 1991 Today's Date: 01/10/2019    History of Present Illness 28 yo male admitted with acute necrotizing pancreatitis. Hx of ETOH abuse, drug abuse    PT Comments    Pt cooperative and progressing with ambulatory stability and tolerance to activity.   Follow Up Recommendations  No PT follow up;Supervision - Intermittent     Equipment Recommendations  None recommended by PT    Recommendations for Other Services       Precautions / Restrictions Precautions Precautions: Fall Restrictions Weight Bearing Restrictions: No    Mobility  Bed Mobility Overal bed mobility: Modified Independent                Transfers Overall transfer level: Modified independent Equipment used: Rolling walker (2 wheeled)             General transfer comment: cues for use of UEs to self assist  Ambulation/Gait Ambulation/Gait assistance: Min guard;Supervision Gait Distance (Feet): 600 Feet Assistive device: Rolling walker (2 wheeled);None Gait Pattern/deviations: Step-through pattern Gait velocity: decr   General Gait Details: Pt ambulated first 100' with RW and sup and additional 500' with CGA.  Mild instability but no LOB   Stairs             Wheelchair Mobility    Modified Rankin (Stroke Patients Only)       Balance Overall balance assessment: Mild deficits observed, not formally tested                                          Cognition Arousal/Alertness: Awake/alert Behavior During Therapy: WFL for tasks assessed/performed Overall Cognitive Status: Within Functional Limits for tasks assessed                                        Exercises      General Comments        Pertinent Vitals/Pain Pain Assessment: No/denies pain    Home Living                      Prior Function            PT Goals (current goals can  now be found in the care plan section) Acute Rehab PT Goals Patient Stated Goal: to regain PLOF PT Goal Formulation: With patient Time For Goal Achievement: 01/23/19 Potential to Achieve Goals: Good Progress towards PT goals: Progressing toward goals    Frequency    Min 3X/week      PT Plan Current plan remains appropriate    Co-evaluation              AM-PAC PT "6 Clicks" Mobility   Outcome Measure  Help needed turning from your back to your side while in a flat bed without using bedrails?: None Help needed moving from lying on your back to sitting on the side of a flat bed without using bedrails?: None Help needed moving to and from a bed to a chair (including a wheelchair)?: None Help needed standing up from a chair using your arms (e.g., wheelchair or bedside chair)?: None Help needed to walk in hospital room?: A Little Help needed climbing 3-5 steps with a railing? : A Little 6 Click Score: 22  End of Session Equipment Utilized During Treatment: Gait belt Activity Tolerance: Patient tolerated treatment well Patient left: in bed;with call bell/phone within reach;with bed alarm set Nurse Communication: Mobility status PT Visit Diagnosis: Unsteadiness on feet (R26.81)     Time: 7829-5621 PT Time Calculation (min) (ACUTE ONLY): 15 min  Charges:  $Gait Training: 8-22 mins                     Mauro Kaufmann PT Acute Rehabilitation Services Pager [phone] Office [phone]    Masae Lukacs 01/10/2019, 3:43 PM

## 2019-01-10 NOTE — Progress Notes (Signed)
[facility] Progress Note  CC:  EtOH + hypertriglyceridemia pancreatitis, ileus  Subjective: He did not sleep well last night. Transferred from ICU step down to floor. NGT went back in during the night. Feels fatigued. No abdominal pain at this time.   Objective:  Vital signs in last 24 hours: Temp:  [98.6 F (37 C)-100.3 F (37.9 C)] 100.3 F (37.9 C) (03/27 0550) Pulse Rate:  [102-132] 119 (03/27 0550) Resp:  [18-29] 18 (03/27 0550) BP: (138-154)/(92-98) 144/95 (03/27 0550) SpO2:  [91 %-96 %] 91 % (03/27 0550) Last BM Date: 01/09/19 General:   Alert and oriented, fatigued appearing in NAD Heart:  Regular rate and rhythm; no murmurs Pulm: clear, decreased bases bilaterally.  Abdomen: distended, firm, nontender, few bowel sounds x 4 quads. NGT draining approx 100cc bilious fluid. Extremities:  Without edema. Neurologic:  Alert and  oriented x4;  grossly normal neurologically. Psych:  Alert and cooperative. Normal mood and affect.  Intake/Output from previous day: 03/26 0701 - 03/27 0700 In: 845 [P.O.:60; I.V.:675; NG/GT:10; IV Piggyback:100] Out: 475 [Urine:100; Emesis/NG output:375] Intake/Output this shift: Total I/O In: 0  Out: 200 [Urine:200]  Lab Results: Recent Labs    01/08/19 0515 01/09/19 0544 01/10/19 0915  WBC 12.7* 14.6* 12.3*  HGB 8.2* 8.7* 7.7*  HCT 25.5* 26.9* 26.7*  PLT 530* 572* 580*   BMET Recent Labs    01/08/19 0515 01/09/19 0544 01/10/19 0336  NA 138 133* 135  K 3.3* 4.3 4.2  CL 104 104 106  CO2 24 22 21*  GLUCOSE 127* 137* 135*  BUN 11 15 14   CREATININE 0.64 0.54* 0.49*  CALCIUM 8.3* 8.0* 8.2*   LFT Recent Labs    01/10/19 0336  PROT 5.7*  ALBUMIN 2.5*  AST 104*  ALT 128*  ALKPHOS 86  BILITOT 1.7*   PT/INR No results for input(s): LABPROT, INR in the last 72 hours. Hepatitis Panel No results for input(s): HEPBSAG, HCVAB, HEPAIGM, HEPBIGM in the last 72 hours.  Dg Chest 1 View  Result Date:  01/08/2019 CLINICAL DATA:  Status post thoracentesis EXAM: CHEST  1 VIEW COMPARISON:  01/08/2019 FINDINGS: Cardiac shadow is stable. Gastric catheter is noted within the stomach. Left-sided PICC line is noted in the distal superior vena cava reposition when compared with the prior exam. Interval thoracentesis on the left is been performed with decrease in left-sided pleural effusion. Small residual effusions are noted bilaterally. No pneumothorax is seen. IMPRESSION: No pneumothorax following left thoracentesis. Small residual effusions are noted bilaterally. PICC line is better positioned when compared with the prior exam now in the distal superior vena cava. Electronically Signed   By: Alcide Clever M.D.   On: 01/08/2019 16:00   Dg Chest 2 View  Result Date: 01/08/2019 CLINICAL DATA:  Evaluate pleural effusion. EXAM: CHEST - 2 VIEW COMPARISON:  January 05, 2019 FINDINGS: The NG tube terminates in the left upper quadrant. A small to moderate left-sided pleural effusion is stable with underlying opacity, likely atelectasis. A new left PICC line has been inserted. There is an unusual bend in the distal PICC line. The distal end could be flipped back on itself or extending into the azygos vein. Consider repositioning. There may be a small right pleural effusion as well. No other interval changes. IMPRESSION: 1. Small to moderate left-sided pleural effusion with underlying opacity, unchanged. 2. Probable small right effusion. 3. The left PICC line distal tip is flipped back on itself and could be entering the azygos  vein. Consider repositioning. The NG tube is in good position. These results will be called to the ordering clinician or representative by the Radiologist Assistant, and communication documented in the PACS or zVision Dashboard. Electronically Signed   By: Gerome Sam III M.D   On: 01/08/2019 12:15   Dg Abd 1 View  Result Date: 01/09/2019 CLINICAL DATA:  Abdominal distension. EXAM: ABDOMEN - 1 VIEW  COMPARISON:  Radiograph 01/07/2019. CT 01/05/2019 FINDINGS: Similar degree of proximal small bowel dilatation from prior exam. Slight increased distal small bowel gas from prior. Increased air in the transverse colon from prior. No evidence of free air on portable supine view. IMPRESSION: Gaseous proximal small bowel distention is similar to prior exams. Slight increased air in nondilated distal small bowel and colon. Overall findings suggest with ileus rather than obstruction. Electronically Signed   By: Narda Rutherford M.D.   On: 01/09/2019 23:03   Dg Abd Portable 1v  Result Date: 01/10/2019 CLINICAL DATA:  28 year old male NG tube placement. EXAM: PORTABLE ABDOMEN - 1 VIEW COMPARISON:  2227 hours the same day. FINDINGS: Portable AP supine view at 2328 hours. Enteric tube has been placed into the left upper quadrant with side hole at the level of the gastric body. Continued abnormal gas pattern with dilated small bowel loops up to 8 centimeters diameter, gas in nondilated ascending and transverse colon. No osseous abnormality identified. Partial left lung base opacity redemonstrated. No pneumoperitoneum evident on this supine image. IMPRESSION: 1. Enteric tube placed into the stomach, side hole the level of the gastric body. 2. Abnormal bowel-gas pattern not significantly changed. Electronically Signed   By: Odessa Fleming M.D.   On: 01/10/2019 00:10   US Thoracentesis Asp Pleural Space W/img Guide  Result Date: 01/08/2019 INDICATION: Patient with history of abdominal pain, alcoholic pancreatitis, left pleural effusion. Request made for diagnostic and therapeutic left thoracentesis. EXAM: ULTRASOUND GUIDED DIAGNOSTIC AND THERAPEUTIC LEFT THORACENTESIS MEDICATIONS: None COMPLICATIONS: None immediate. PROCEDURE: An ultrasound guided thoracentesis was thoroughly discussed with the patient and questions answered. The benefits, risks, alternatives and complications were also discussed. The patient understands and  wishes to proceed with the procedure. Written consent was obtained. Ultrasound was performed to localize and mark an adequate pocket of fluid in the left chest. The area was then prepped and draped in the normal sterile fashion. 1% Lidocaine was used for local anesthesia. Under ultrasound guidance a 6 Fr Safe-T-Centesis catheter was introduced. Thoracentesis was performed. The catheter was removed and a dressing applied. FINDINGS: A total of approximately 640 cc of hazy, dark amber/tea-colored fluid was removed. Samples were sent to the laboratory as requested by the clinical team. IMPRESSION: Successful ultrasound guided diagnostic and therapeutic left thoracentesis yielding 640 cc of pleural fluid. Read by: Jeananne Rama, PA-C Electronically Signed   By: Richarda Overlie M.D.   On: 01/08/2019 15:49    Assessment / Plan:  [age] y.o. male with EtOH pancreatitis+ hypertriglyceridema. Repeat A/P CT 3/22 identified severe necrotizing pancreatitis with acute inflammatory/necrotic fluid collection with questionable infectious pancreatic necrosis. Temp 101.6 3/25. Today Temp. 100.3.  WBC 14.6. Repeat WBC  12.3 at 9am.TGs 129. Transferred to floor 3/26. -Meropenem 1gm IV Q 8 hrs -TPN -NPO -IV Albumin 25gm IV Q 6 hrs -IVF 50cc/hr -Continue to monitor electrolytesand Triglyceride level daily -Continue to monitor temp and WBC -If continues to spike fever or if he fails to improve, willconsiderIR consultation fordrainage/cultures  2. Generalized abdominal pain secondary to # 1. Toradol dc'd. Pain management with caution  d/t hx polyp drug abuse. -continue PPI IV BID  -pain management per hospitalist  3.Small bowelIleussecondary to pancreatitis. Repeat Abd/Pelvic CT 3/22 showed gaseous distention of the proximal jejunum, distended to approximately 5 cm diametersecondary to ileus. NGT dc'd 3/26 am. Abdominal distension increased, repeat xray 3/27 NGT confirmed NGT in stomach, continued abnormal gas pattern  with dilated small bowel loops up to 8 centimeters diameter, gas in nondilated ascending and transverse colon. NGT  was replaced last night.  Patient is passing gas per the rectum this am, last BM was 3/26. -NGT to LIS -Monitor abdominal distension, BMs -repeat abd 2 V in am  4. Anemia, dilutional, + rectal bleeding.Iron32. Received Feraheme IV 3/23. Hg dropped to 6.6 3/24. He received 1 unit PRBC 3/24 Hg >> 7.9.  Today  Hg 7.7  << 8.7.  -Transfuse for Hg < 7 -Continue to monitor rectal bleeding -No plans for EGD/colonoscopy at this time  5. Leukocytosisd/t pleural effusion vs pancreatitis.  Temp 101.6 3/25. Temp Today 100.3. WBC up to 14.6, repeat WBC 12.3. A/P CTsee results above, CT alsoshows new bibasilar pleural effusions, left greater than right with associated compressive atelectasis. Blood culturesno growth 48 hrs.Urine cx negative.[facility] 3/22 no growth 3 days. S/P Thoracentesis 3/25 640cc amber fluid removed, cx, cytology results pending.  -Continue to monitor Temp and WBC -Await thoracentesis cx and cytology results  -Continue Meropenem IV  6. Increasing  LFTs secondary to pancreatitis. AST 104 >>68 >> 30. ALT 128 >>80 >> 27. T. Bili 1.7 >>1.5. 3/18 Acute hepatitis panel negative. A/P CT 3/22 No liver abnormalities, no gallstones or gallbladder wall thickening. Normal Spleen. -continue to monitor LFTs  7. Hypertension multifactorial including pancreatitis/pneumonia/ abd pain. SBP 140-190's. DBP 90-100's. -management per hospitalist  -pain management per hospitalist   8.Malnutrition. Albumin 2.7. -continue TPN -check daily electrolytes and TGs  9. Etoh abuse -no evidence of withddrawal       LOS: 13 days   Arnaldo Natal  01/10/2019, 9:44 AM

## 2019-01-10 NOTE — Progress Notes (Addendum)
PROGRESS NOTE    [name]  ZOX:[id number] DOB: 1990-10-19 DOA: 12/27/2018 PCP: Patient, No Pcp Per   Brief Narrative:  Patient is Charles Dougherty 28 year old male with history of alcohol abuse, substance abuse, smoker who presents to the emergencydepartment with complaints of abdominal pain. Admitted for the management of acute pancreatitis related to alcohol.Incidentally found to havesevere hypertriglyceridemia.He has positive family history.Hospital course remarkable for abdominal discomfort, distention .Abdominal x-ray suggested early bowel obstruction. General surgery consulted and he was started on conservative management with NG tube decompression, n.p.o. status. Unclear if the pancreatitis was precipitated by alcohol or hypertriglyceridemia.Due to severe hypertriglyceridemia,westarted on insulin drip and moved to stepdown. GI also consulted.  Assessment & Plan:   Principal Problem:   Acute necrotizing pancreatitis Active Problems:   Acute alcoholic pancreatitis   Pancreatitis   ETOH abuse   Drug abuse (HCC)   Cystitis   Ileus (HCC)   Hypertriglyceridemia   Abdominal pain   SBO (small bowel obstruction) (HCC)   Hypophosphatemia   Hypomagnesemia   Hypocalcemia   AKI (acute kidney injury) (HCC)   Anemia   Fever   Pneumonia of left lower lobe due to infectious organism [facility])   1 Acute necrotizing pancreatitis secondary to alcohol and hypertriglyceridemia   Ileus Presented 3/13 with elevated lipase and triglycerides >5000 Daily drinker CT from 3/14 with acute edematous interstitial pancreatitis 3/22 CT with evidence of necrotizing pancreatitis.  New free fluid and phlegmonous/inflammatory material surrounding the pancrease extending into bilateral paracolic gutters and pelvis (see report) PT having BM's.   Pt on clears yesterday, but c/o increased abdominal distension/bloating on 3/26 PM.  Abdominal x ray repeated, suggestive of ileus.  NGT replaced overnight. Will  discuss repeat imaging with GI today TPN for nutrition GI following, appreciate recs  Currently on meropenem in setting of fevers Morphine prn pain.  D/c toradol now (s/p 5 days).  #.  Bilateral Pleural Effusions (L>R)   Left lower lobe atelectasis/consolidation Noted on CXR from 3/25 Last fever 3/24 PM Blood and urine cx NGTD Leukocytosis present, but improving Negative urine strep and legionella Currently on meropenem for possible pneumonia and necrotizing pancreatitis S/p thoracentesis by IR on 3/26, with 640 cc hazy dark amber/tea colored fluid Exudative by light's.  Follow pending culture (NGTDx2), pH 7.5, and cytology (reactive appearing mesothelial cells).  Follow add on amylase.  Possibly 2/2 acute pancreatitis?  2.  Severe hypertriglyceridemia Patient with family history of hypertriglyceridemia. Patient was placed on the insulin drip with significant improvement with elevated triglyceride levels.   Repeat tomorrow Will need fibrate on d/c  3.  Small bowel obstruction versus ileus secondary to pancreatitis Patient with 1.1 L output from NG tube over the past 24 hours.   Abdominal films 3/24 with ileus vs SBO, suspect ileus more likely as pt having BM's Continue simethicone 160 mg p.o. 4 times daily.   Patient started on IV Reglan per gastroenterology.   NG back in today CT abdomen/pelvis as noted above  6.  Fever Likely 2/2 above.  Currently on abx for possible pneumonia and necrotizing pancreatitis.   S/p thora as noted above Blood and urine cx ngtd. May need repeat imaging (planning for discussion with GI today). Temp elevated to 100.3 this AM.  Continue to monitor.  7.  Hypophosphatemia/hypokalemia Replace and follow.   8.  Anemia S/p 1 unit on 3/19 and 1 unit on 3/24  Likely dilutional and secondary to worsening pancreatitis.   Patient with no overt bleeding.   Anemia panel  consistent with anemia of chronic disease/iron deficiency anemia.  FOBT negative.     Per GI upper endoscopy to be done in the outpatient setting.  GI following.  9.  Hypocalcemia/hypoalbuminemia Hypocalcemia corrects with albumin Continue to monitor with TPN   10.  Transaminitis Images done did not show any gallbladder stones or inflammation.  Patient noted to have diffuse fatty liver.   LFT's now trending back up, continue to monitor closely.  Repeat imaging likely today as notaed above.  11.  Thrombocytopenia   Thrombocytosis Likely secondary to alcoholic liver disease.  Lovenox discontinued.  No overt bleeding.  Now with elevated platelets.  12.  Suspected cystitis Per CT imaging.  Patient currently asymptomatic.  Urinalysis unremarkable.   13.  Acute kidney injury Resolved with hydration.  Follow.   14.  Hypoglycemic events 2/2 insulin gtt and NPO status. Now on TPN Follow   15 alcohol abuse No signs of withdrawal.  Alcohol cessation stressed to patient.  Social work consulted.  Follow.  16.  Malnutrition Patient presented with acute pancreatitis.  Has essentially been n.p.o. since admission.  Prealbumin of 7.0.  TPN ordered and started the evening of 01/06/2019.  Patient also placed on IV albumin every 6 hours x1 day.  GI following.  # PICC in place, imaging distal PICC tip flipped back and could be entering azygos vein.  Now better positioned.  # Insomnia: benadryl ordered for insomia prn (ambien/restoril have not helped)  # Hypertension: start amlodipine  # Sinus Tachycardia: likely 2/2 above, continue to monitor  DVT prophylaxis: SCD Code Status: full  Family Communication: none at bedside Disposition Plan: pending improvement   Consultants:   GI  IR  Procedures:   none  Antimicrobials:  Anti-infectives (From admission, onward)   Start     Dose/Rate Route Frequency Ordered Stop   01/06/19 1800  meropenem (MERREM) 1 g in sodium chloride 0.9 % 100 mL IVPB     1 g 200 mL/hr over 30 Minutes Intravenous Every 8 hours 01/06/19  1722     01/05/19 1800  levofloxacin (LEVAQUIN) IVPB 750 mg  Status:  Discontinued     750 mg 100 mL/hr over 90 Minutes Intravenous Every 24 hours 01/05/19 0954 01/05/19 1000   01/05/19 1800  levofloxacin (LEVAQUIN) IVPB 500 mg  Status:  Discontinued     500 mg 100 mL/hr over 60 Minutes Intravenous Every 24 hours 01/05/19 1012 01/05/19 1041   01/05/19 1130  levofloxacin (LEVAQUIN) IVPB 750 mg  Status:  Discontinued     750 mg 100 mL/hr over 90 Minutes Intravenous Daily 01/05/19 1041 01/06/19 1715   01/04/19 1800  levofloxacin (LEVAQUIN) IVPB 500 mg  Status:  Discontinued     500 mg 100 mL/hr over 60 Minutes Intravenous Every 24 hours 01/04/19 1612 01/05/19 0954     Subjective: Feeling better after NG was replaced. Had several BM's yesterday.  None yet today. Passing gas.  Objective: Vitals:   01/09/19 1432 01/09/19 2150 01/10/19 0256 01/10/19 0550  BP: (!) 146/98 (!) 149/98 (!) 140/92 (!) 144/95  Pulse: (!) 122 (!) 132 (!) 106 (!) 119  Resp: (!) 24 18 (!) 28 18  Temp: 98.6 F (37 C) 99.9 F (37.7 C)  100.3 F (37.9 C)  TempSrc:  Oral  Oral  SpO2: 94% 95% 94% 91%  Weight:      Height:        Intake/Output Summary (Last 24 hours) at 01/10/2019 0934 Last data filed at 01/10/2019 [id number] Gross  per 24 hour  Intake 844.95 ml  Output 675 ml  Net 169.95 ml   Filed Weights   01/06/19 0500 01/07/19 0351 01/08/19 0500  Weight: 77.3 kg 77.3 kg 77.6 kg    Examination:  General: No acute distress. Cardiovascular: mildly tachycardic Lungs: Clear to auscultation bilaterally Abdomen: distended, mildly ttp.  NG in place. Neurological: Alert and oriented 3. Moves all extremities 4. Cranial nerves II through XII grossly intact. Skin: Warm and dry. No rashes or lesions. Extremities: No clubbing or cyanosis. No edema. Pedal pulses 2+. Psychiatric: Mood and affect are normal. Insight and judgment are appropriate.     Data Reviewed: I have personally reviewed following labs and  imaging studies  CBC: Recent Labs  Lab 01/04/19 0518  01/06/19 0503 01/07/19 0830 01/07/19 1630 01/08/19 0515 01/09/19 0544 01/10/19 0915  WBC 13.3*   < > 13.5* 13.0*  --  12.7* 14.6* 12.3*  NEUTROABS 11.4*  --  11.1* 11.2*  --  11.2*  --   --   HGB 8.5*   < > 7.5* 6.6* 7.9* 8.2* 8.7* 7.7*  HCT 26.3*   < > 24.1* 20.5* 24.2* 25.5* 26.9* 26.7*  MCV 101.9*   < > 105.7* 99.5  --  97.3 98.5 109.0*  PLT 213   < > 410* 488*  --  530* 572* 580*   < > = values in this interval not displayed.   Basic Metabolic Panel: Recent Labs  Lab 01/06/19 0503 01/07/19 0314 01/08/19 0515 01/09/19 0544 01/10/19 0336  NA 133* 135 138 133* 135  K 3.8 3.4* 3.3* 4.3 4.2  CL 102 103 104 104 106  CO2 21* 23 24 22  21*  GLUCOSE 94 148* 127* 137* 135*  BUN 9 8 11 15 14   CREATININE 0.69 0.67 0.64 0.54* 0.49*  CALCIUM 7.9* 8.0* 8.3* 8.0* 8.2*  MG 2.1 2.1 2.3 2.1 2.0  PHOS 4.2 3.4 4.2 4.0 3.6   GFR: Estimated Creatinine Clearance: 145.7 mL/min (Moneisha Vosler) (by C-G formula based on SCr of 0.49 mg/dL (L)). Liver Function Tests: Recent Labs  Lab 01/04/19 0518 01/06/19 0503 01/08/19 0515 01/09/19 0544 01/10/19 0336  AST 35 30 60* 68* 104*  ALT 31 27 49* 80* 128*  ALKPHOS 72 63 81 98 86  BILITOT 1.6* 1.6* 2.0* 1.5* 1.7*  PROT 5.2* 5.0* 5.6* 5.6* 5.7*  ALBUMIN 2.1* 2.0* 2.8* 2.7* 2.5*   Recent Labs  Lab 01/06/19 0503  LIPASE 47   No results for input(s): AMMONIA in the last 168 hours. Coagulation Profile: Recent Labs  Lab 01/06/19 0503  INR 1.3*   Cardiac Enzymes: No results for input(s): CKTOTAL, CKMB, CKMBINDEX, TROPONINI in the last 168 hours. BNP (last 3 results) No results for input(s): PROBNP in the last 8760 hours. HbA1C: No results for input(s): HGBA1C in the last 72 hours. CBG: Recent Labs  Lab 01/09/19 1544 01/09/19 1927 01/10/19 0000 01/10/19 0405 01/10/19 0742  GLUCAP 141* 148* 141* 136* 145*   Lipid Profile: Recent Labs    01/08/19 0515 01/09/19 0544  TRIG 170* 129    Thyroid Function Tests: No results for input(s): TSH, T4TOTAL, FREET4, T3FREE, THYROIDAB in the last 72 hours. Anemia Panel: No results for input(s): VITAMINB12, FOLATE, FERRITIN, TIBC, IRON, RETICCTPCT in the last 72 hours. Sepsis Labs: Recent Labs  Lab 01/08/19 0515 01/08/19 0800  LATICACIDVEN 1.3 1.3    Recent Results (from the past 240 hour(s))  Culture, blood (Routine X 2) w Reflex to ID Panel  Status: None   Collection Time: 01/05/19  9:05 AM  Result Value Ref Range Status   Specimen Description   Final    BLOOD RIGHT HAND Performed at [facility], [address], [place], [place] [address]    Special Requests   Final    BOTTLES DRAWN AEROBIC ONLY Blood Culture adequate volume Performed at [facility], [address] [place], [place] [address]    Culture   Final    NO GROWTH 5 DAYS Performed at [facility], [address], [place], [place] [address]    Report Status 01/10/2019 FINAL  Final  Culture, blood (Routine X 2) w Reflex to ID Panel     Status: None   Collection Time: 01/05/19  9:05 AM  Result Value Ref Range Status   Specimen Description   Final    BLOOD LEFT HAND Performed at [facility], [address] [place], [place] [address]    Special Requests   Final    BOTTLES DRAWN AEROBIC ONLY Blood Culture adequate volume Performed at [facility], [address], [place], [place] [address]    Culture   Final    NO GROWTH 5 DAYS Performed at [facility], [address] [place], [place] [address]    Report Status 01/10/2019 FINAL  Final  Culture, Urine     Status: None   Collection Time: 01/05/19 10:55 AM  Result Value Ref Range Status   Specimen Description   Final    URINE, CLEAN CATCH Performed at [facility], [address], [place], [place] [address]    Special Requests   Final    NONE Performed at [facility],  [address], [place], [place] [address]    Culture   Final    NO GROWTH Performed at [facility], [address], [place], [place] [address]    Report Status 01/06/2019 FINAL  Final  Culture, body fluid-bottle     Status: None (Preliminary result)   Collection Time: 01/08/19  3:48 PM  Result Value Ref Range Status   Specimen Description PLEURAL  Final   Special Requests NONE  Final   Culture   Final    NO GROWTH 2 DAYS Performed at [facility], [address] [place], [place] [address]    Report Status PENDING  Incomplete  Gram stain     Status: None   Collection Time: 01/08/19  3:48 PM  Result Value Ref Range Status   Specimen Description PLEURAL  Final   Special Requests NONE  Final   Gram Stain   Final    MODERATE WBC PRESENT, PREDOMINANTLY PMN NO ORGANISMS SEEN Performed at [facility], [address] [place], [place] [address]    Report Status 01/08/2019 FINAL  Final         Radiology Studies: Dg Chest 1 View  Result Date: 01/08/2019 CLINICAL DATA:  Status post thoracentesis EXAM: CHEST  1 VIEW COMPARISON:  01/08/2019 FINDINGS: Cardiac shadow is stable. Gastric catheter is noted within the stomach. Left-sided PICC line is noted in the distal superior vena cava reposition when compared with the prior exam. Interval thoracentesis on the left is been performed with decrease in left-sided pleural effusion. Small residual effusions are noted bilaterally. No pneumothorax is seen. IMPRESSION: No pneumothorax following left thoracentesis. Small residual effusions are noted bilaterally. PICC line is better positioned when compared with  the prior exam now in the distal superior vena cava. Electronically Signed   By: Alcide Clever M.D.   On: 01/08/2019 16:00   Dg Chest 2 View  Result Date: 01/08/2019 CLINICAL DATA:  Evaluate pleural effusion. EXAM: CHEST - 2 VIEW COMPARISON:  January 05, 2019 FINDINGS: The NG tube terminates in the left upper quadrant. Sasha Rogel  small to moderate left-sided pleural effusion is stable with underlying opacity, likely atelectasis. Cayleigh Paull new left PICC line has been inserted. There is an unusual bend in the distal PICC line. The distal end could be flipped back on itself or extending into the azygos vein. Consider repositioning. There may be Arantxa Piercey small right pleural effusion as well. No other interval changes. IMPRESSION: 1. Small to moderate left-sided pleural effusion with underlying opacity, unchanged. 2. Probable small right effusion. 3. The left PICC line distal tip is flipped back on itself and could be entering the azygos vein. Consider repositioning. The NG tube is in good position. These results will be called to the ordering clinician or representative by the Radiologist Assistant, and communication documented in the PACS or zVision Dashboard. Electronically Signed   By: Gerome Sam III M.D   On: 01/08/2019 12:15   Dg Abd 1 View  Result Date: 01/09/2019 CLINICAL DATA:  Abdominal distension. EXAM: ABDOMEN - 1 VIEW COMPARISON:  Radiograph 01/07/2019. CT 01/05/2019 FINDINGS: Similar degree of proximal small bowel dilatation from prior exam. Slight increased distal small bowel gas from prior. Increased air in the transverse colon from prior. No evidence of free air on portable supine view. IMPRESSION: Gaseous proximal small bowel distention is similar to prior exams. Slight increased air in nondilated distal small bowel and colon. Overall findings suggest with ileus rather than obstruction. Electronically Signed   By: Narda Rutherford M.D.   On: 01/09/2019 23:03   Dg Abd Portable 1v  Result Date: 01/10/2019 CLINICAL DATA:  28 year old male NG tube placement. EXAM: PORTABLE ABDOMEN - 1 VIEW COMPARISON:  2227 hours the same day. FINDINGS: Portable AP supine view at 2328 hours. Enteric tube has been placed into the left upper quadrant with side hole at the level of the gastric body. Continued abnormal gas pattern with dilated small bowel  loops up to 8 centimeters diameter, gas in nondilated ascending and transverse colon. No osseous abnormality identified. Partial left lung base opacity redemonstrated. No pneumoperitoneum evident on this supine image. IMPRESSION: 1. Enteric tube placed into the stomach, side hole the level of the gastric body. 2. Abnormal bowel-gas pattern not significantly changed. Electronically Signed   By: Odessa Fleming M.D.   On: 01/10/2019 00:10   US Thoracentesis Asp Pleural Space W/img Guide  Result Date: 01/08/2019 INDICATION: Patient with history of abdominal pain, alcoholic pancreatitis, left pleural effusion. Request made for diagnostic and therapeutic left thoracentesis. EXAM: ULTRASOUND GUIDED DIAGNOSTIC AND THERAPEUTIC LEFT THORACENTESIS MEDICATIONS: None COMPLICATIONS: None immediate. PROCEDURE: An ultrasound guided thoracentesis was thoroughly discussed with the patient and questions answered. The benefits, risks, alternatives and complications were also discussed. The patient understands and wishes to proceed with the procedure. Written consent was obtained. Ultrasound was performed to localize and mark an adequate pocket of fluid in the left chest. The area was then prepped and draped in the normal sterile fashion. 1% Lidocaine was used for local anesthesia. Under ultrasound guidance Jamar Weatherall 6 Fr Safe-T-Centesis catheter was introduced. Thoracentesis was performed. The catheter was removed and Damacio Weisgerber dressing applied. FINDINGS: Edita Weyenberg total of approximately 640 cc of hazy, dark amber/tea-colored fluid was  removed. Samples were sent to the laboratory as requested by the clinical team. IMPRESSION: Successful ultrasound guided diagnostic and therapeutic left thoracentesis yielding 640 cc of pleural fluid. Read by: Jeananne Rama, PA-C Electronically Signed   By: Richarda Overlie M.D.   On: 01/08/2019 15:49        Scheduled Meds:  amLODipine  5 mg Oral Daily   Chlorhexidine Gluconate Cloth  6 each Topical Daily   Chlorhexidine  Gluconate Cloth  6 each Topical Daily   feeding supplement  1 Container Oral TID BM   insulin aspart  0-9 Units Subcutaneous Q4H   metoCLOPramide (REGLAN) injection  10 mg Intravenous Q8H   nicotine  14 mg Transdermal Daily   pantoprazole (PROTONIX) IV  40 mg Intravenous Q12H   simethicone  160 mg Oral QID   sodium chloride flush  10-40 mL Intracatheter Q12H   Continuous Infusions:  sodium chloride 1,000 mL (01/08/19 1645)   meropenem (MERREM) IV 1 g (01/10/19 0159)   TPN ADULT (ION) 85 mL/hr at 01/09/19 1704     LOS: 13 days    Time spent: over 30 min    Lacretia Nicks, MD [facility] Pager AMION  If 7PM-7AM, please contact night-coverage [email or web address] Password [facility] 01/10/2019, 9:34 AM

## 2019-01-10 NOTE — Progress Notes (Signed)
Per Dr. Lowell Guitar, patient is NPO until seen by GI.

## 2019-01-11 ENCOUNTER — Inpatient Hospital Stay (HOSPITAL_COMMUNITY): Payer: Federal, State, Local not specified - PPO

## 2019-01-11 DIAGNOSIS — R748 Abnormal levels of other serum enzymes: Secondary | ICD-10-CM

## 2019-01-11 LAB — COMPREHENSIVE METABOLIC PANEL
ALBUMIN: 2.4 g/dL — AB (ref 3.5–5.0)
ALT: 201 U/L — ABNORMAL HIGH (ref 0–44)
ANION GAP: 9 (ref 5–15)
AST: 157 U/L — ABNORMAL HIGH (ref 15–41)
Alkaline Phosphatase: 112 U/L (ref 38–126)
BUN: 13 mg/dL (ref 6–20)
CO2: 22 mmol/L (ref 22–32)
Calcium: 8 mg/dL — ABNORMAL LOW (ref 8.9–10.3)
Chloride: 103 mmol/L (ref 98–111)
Creatinine, Ser: 0.59 mg/dL — ABNORMAL LOW (ref 0.61–1.24)
GFR calc Af Amer: >60 mL/min (ref >60)
GFR calc non Af Amer: >60 mL/min (ref >60)
GLUCOSE: 122 mg/dL — AB (ref 70–99)
Potassium: 4.1 mmol/L (ref 3.5–5.1)
Sodium: 134 mmol/L — ABNORMAL LOW (ref 135–145)
Total Bilirubin: 1.7 mg/dL — ABNORMAL HIGH (ref 0.3–1.2)
Total Protein: 5.8 g/dL — ABNORMAL LOW (ref 6.5–8.1)

## 2019-01-11 LAB — GLUCOSE, CAPILLARY
GLUCOSE-CAPILLARY: 133 mg/dL — AB (ref 70–99)
Glucose-Capillary: 125 mg/dL — ABNORMAL HIGH (ref 70–99)
Glucose-Capillary: 127 mg/dL — ABNORMAL HIGH (ref 70–99)
Glucose-Capillary: 130 mg/dL — ABNORMAL HIGH (ref 70–99)

## 2019-01-11 LAB — TRIGLYCERIDES: Triglycerides: 149 mg/dL (ref <150)

## 2019-01-11 LAB — MAGNESIUM: Magnesium: 2.2 mg/dL (ref 1.7–2.4)

## 2019-01-11 LAB — PHOSPHORUS: PHOSPHORUS: 4.3 mg/dL (ref 2.5–4.6)

## 2019-01-11 MED ORDER — FAMOTIDINE IN NACL 20-0.9 MG/50ML-% IV SOLN
20.0000 mg | Freq: Two times a day (BID) | INTRAVENOUS | Status: DC
  Administered 2019-01-11: 20 mg via INTRAVENOUS

## 2019-01-11 MED ORDER — TRACE MINERALS CR-CU-MN-SE-ZN 10-1000-500-60 MCG/ML IV SOLN
INTRAVENOUS | Status: AC
  Administered 2019-01-11: 18:00:00 via INTRAVENOUS
  Filled 2019-01-11: qty 720

## 2019-01-11 MED ORDER — FAMOTIDINE IN NACL 20-0.9 MG/50ML-% IV SOLN
20.0000 mg | Freq: Once | INTRAVENOUS | Status: AC
  Administered 2019-01-11: 20 mg via INTRAVENOUS
  Filled 2019-01-11: qty 50

## 2019-01-11 MED ORDER — INSULIN ASPART 100 UNIT/ML ~~LOC~~ SOLN
0.0000 [IU] | Freq: Three times a day (TID) | SUBCUTANEOUS | Status: DC
  Administered 2019-01-11 (×2): 1 [IU] via SUBCUTANEOUS

## 2019-01-11 NOTE — Progress Notes (Signed)
[facility] Progress Note   Chief Complaint:   pancreatitis    SUBJECTIVE:   NGT clamped this am,  feels a little bloated after apple juice, + flatus, + BM this am. No significant abdominal pain   ASSESSMENT AND PLAN:   1. 28 yo male with severe acute necrotizing pancreatitis ( Etoh + hypertriglyceridemia) with inflammatory / necrotic fluid collection complicated by pleural effusion / ileus. His is s/p thoracentesis 01/08/19  No organisms on gram stain, no growth on culture in 2 days. Repeat CT scan yesterday >>> severe pancreatitis with extensive pseudocyst formation similar to prior study. Biliary sludge in gallbladder,  large left pleural effusion, small volume ascites. -NGT clamped 2 hours ago, feels a little bloated since apple juice early but no nausea. On TNA. Lets see how he does with NGT clamped -Transaminases continue to rise.  -He is afebrile. WBC down today at 12.3 -On Meropenem  2. Anemia. Likely dilutional but did have some rectal bleeding at one point. Ferritin 1383 (inflammation driven). Got u PRBC on 3/19 and 3/24.      OBJECTIVE:     Vital signs in last 24 hours: Temp:  [98.5 F (36.9 C)-99.6 F (37.6 C)] 99.6 F (37.6 C) (03/27 2052) Pulse Rate:  [116-117] 117 (03/27 2052) Resp:  [20-21] 20 (03/27 2052) BP: (143-146)/(94-99) 146/94 (03/27 2052) SpO2:  [95 %] 95 % (03/27 2052) Weight:  [75.5 kg] 75.5 kg (03/27 2052) Last BM Date: 01/09/19 General:   Alert, well-developed male in NAD EENT:  Normal hearing, non icteric sclera, conjunctive pink.  Heart:  Regular rate and rhythm; no murmur.  No lower extremity edema   Pulm: Normal respiratory effort Abdomen:  Soft, distended, nontender, hypoactive bowel sounds  Neurologic:  Alert and  oriented x4;  grossly normal neurologically. Psych:  Pleasant, cooperative.  Normal mood and affect.   Intake/Output from previous day: 03/27 0701 - 03/28 0700 In: 1130 [I.V.:1020; NG/GT:10; IV  Piggyback:100] Out: 1175 [Urine:475] Intake/Output this shift: Total I/O In: -  Out: 300 [Other:300]  Lab Results: Recent Labs    01/09/19 0544 01/10/19 0915 01/11/19 0426  WBC 14.6* 12.3* 12.7*  HGB 8.7* 7.7* 8.4*  HCT 26.9* 26.7* 27.5*  PLT 572* 580* 657*   BMET Recent Labs    01/09/19 0544 01/10/19 0336 01/11/19 0426  NA 133* 135 134*  K 4.3 4.2 4.1  CL 104 106 103  CO2 22 21* 22  GLUCOSE 137* 135* 122*  BUN 15 14 13   CREATININE 0.54* 0.49* 0.59*  CALCIUM 8.0* 8.2* 8.0*   LFT Recent Labs    01/11/19 0426  PROT 5.8*  ALBUMIN 2.4*  AST 157*  ALT 201*  ALKPHOS 112  BILITOT 1.7*   PT/INR No results for input(s): LABPROT, INR in the last 72 hours. Hepatitis Panel No results for input(s): HEPBSAG, HCVAB, HEPAIGM, HEPBIGM in the last 72 hours.  Ct Abdomen Pelvis W Wo Contrast  Result Date: 01/10/2019 CLINICAL DATA:  28 year old male with history of necrotizing pancreatitis. History of alcohol and substance abuse. Abdominal pain. EXAM: CT ABDOMEN AND PELVIS WITHOUT AND WITH CONTRAST TECHNIQUE: Multidetector CT imaging of the abdomen and pelvis was performed following the standard protocol before and following the bolus administration of intravenous contrast. CONTRAST:  OMNIPAQUE IOHEXOL 300 MG/ML  SOLN COMPARISON:  CT the abdomen and pelvis 01/05/2019. FINDINGS: Lower chest: Large left and small right pleural effusions lying dependently with associated areas of passive subsegmental atelectasis throughout the lower lobes of  the lungs bilaterally. Nasogastric tube extending into the stomach. Hepatobiliary: No suspicious cystic or solid hepatic lesions. No intra or extrahepatic biliary ductal dilatation. Amorphous high attenuation material in the gallbladder, likely to reflect biliary sludge. Gallbladder does not appear distended. No calcified gallstones in the gallbladder. Pancreas: No pancreatic mass. Extensive peripancreatic fluid and inflammatory changes again  noted. These peripancreatic fluid collections extend all the way around the pancreas as well as adjacent portions of duodenum, with extension laterally throughout the retroperitoneum bilaterally, with some extension caudally into the anatomic pelvis tracking laterally to the psoas musculature. The extent of these collections is very similar to the recent CT 01/05/2019. Pancreatic parenchyma appears to enhance normally. No pancreatic ductal dilatation. Spleen: Unremarkable. Adrenals/Urinary Tract: Bilateral kidneys and adrenal glands are normal in appearance. No hydroureteronephrosis. Urinary bladder is normal in appearance. Stomach/Bowel: Nasogastric tube tip in the body of the stomach. Stomach is deformed related to perigastric fluid collections, most compatible with extensive pancreatic pseudocysts. Some dilated loops of mid small bowel (jejunum) are noted measuring up to 4.7 cm in diameter with some small air-fluid levels, presumably a regional ileus. No pathologic dilatation of distal small bowel or colon. Appendix is not confidently identified may be surgically absent. Vascular/Lymphatic: No significant atherosclerotic disease, aneurysm or dissection noted in the abdominal or pelvic vasculature. No lymphadenopathy noted in the abdomen or pelvis. Reproductive: Prostate gland and seminal vesicles are unremarkable in appearance. Other: Small volume of ascites.  No pneumoperitoneum. Musculoskeletal: There are no aggressive appearing lytic or blastic lesions noted in the visualized portions of the skeleton. IMPRESSION: 1. Severe pancreatitis with extensive pseudocyst formation throughout the retroperitoneum and upper abdomen, similar to the prior study, as detailed above. 2. At this time, the pancreatic parenchyma appears to enhance normally. 3. Biliary sludge in the gallbladder without definite findings to suggest an acute cholecystitis at this time. 4. Large left and small left pleural effusions lying dependently  with extensive areas of passive atelectasis throughout the lung bases. 5. Small volume of ascites. Electronically Signed   By: Trudie Reed M.D.   On: 01/10/2019 15:49   Dg Chest 2 View  Result Date: 01/11/2019 CLINICAL DATA:  Shortness of breath and chest pain. Re-evaluate pleural effusion. EXAM: CHEST - 2 VIEW COMPARISON:  01/08/2019 FINDINGS: Left PICC line tip at low SVC.  Nasogastric tip at gastric fundus. Midline trachea. Normal heart size. Slight increase in small left pleural effusion. No pneumothorax. Minimal right infrahilar atelectasis. Patchy left base airspace disease. IMPRESSION: Slight increase in small left pleural effusion with persistent adjacent left lower lobe atelectasis. Electronically Signed   By: Jeronimo Greaves M.D.   On: 01/11/2019 10:51   Dg Abd 1 View  Result Date: 01/09/2019 CLINICAL DATA:  Abdominal distension. EXAM: ABDOMEN - 1 VIEW COMPARISON:  Radiograph 01/07/2019. CT 01/05/2019 FINDINGS: Similar degree of proximal small bowel dilatation from prior exam. Slight increased distal small bowel gas from prior. Increased air in the transverse colon from prior. No evidence of free air on portable supine view. IMPRESSION: Gaseous proximal small bowel distention is similar to prior exams. Slight increased air in nondilated distal small bowel and colon. Overall findings suggest with ileus rather than obstruction. Electronically Signed   By: Narda Rutherford M.D.   On: 01/09/2019 23:03   Dg Abd Portable 1v  Result Date: 01/10/2019 CLINICAL DATA:  28 year old male NG tube placement. EXAM: PORTABLE ABDOMEN - 1 VIEW COMPARISON:  2227 hours the same day. FINDINGS: Portable AP supine view at 2328 hours.  Enteric tube has been placed into the left upper quadrant with side hole at the level of the gastric body. Continued abnormal gas pattern with dilated small bowel loops up to 8 centimeters diameter, gas in nondilated ascending and transverse colon. No osseous abnormality identified.  Partial left lung base opacity redemonstrated. No pneumoperitoneum evident on this supine image. IMPRESSION: 1. Enteric tube placed into the stomach, side hole the level of the gastric body. 2. Abnormal bowel-gas pattern not significantly changed. Electronically Signed   By: Odessa Fleming M.D.   On: 01/10/2019 00:10      Principal Problem:   Acute necrotizing pancreatitis Active Problems:   Acute alcoholic pancreatitis   Pancreatitis   ETOH abuse   Drug abuse (HCC)   Cystitis   Ileus (HCC)   Hypertriglyceridemia   Abdominal pain   SBO (small bowel obstruction) (HCC)   Hypophosphatemia   Hypomagnesemia   Hypocalcemia   AKI (acute kidney injury) (HCC)   Anemia   Fever   Pneumonia of left lower lobe due to infectious organism (HCC)     LOS: 14 days   Willette Cluster ,NP 01/11/2019, 11:07 AM

## 2019-01-11 NOTE — Progress Notes (Signed)
PHARMACY - ADULT TOTAL PARENTERAL NUTRITION CONSULT NOTE   Pharmacy Consult for TPN Indication: Severe Pancreatitis  Patient Measurements: Height: 5\' 10"  (177.8 cm) Weight: 166 lb 7.2 oz (75.5 kg) IBW/kg (Calculated) : 73 TPN AdjBW (KG): 78 Body mass index is 23.88 kg/m. Usual Weight: 70.4 kg  HPI: 43 yoM admitted on 3/14 with pancreatitis (precipitated by hypertriglyceridemia).Hypertriglyceridemia was treated with insulin drip.Abdominal x-ray suggested early bowel obstruction.NGT remains on suction. Unable to tolerate clears.  Central access: PICC 3/23 TPN start date: 3/23  Significant events:   3/21 Reglan added  3/22:TPN ordered but unable to start today d/t ordering cut off time of 12:00,PICC line was ordered, butnot placed, concern with leukocytosis  3/23 Albumin x 4 doses; TPN started without lipids  3/25 bilateral pleural effusions s/p L thoracentesis, out. PICC with possible malpositioning - held use of PICC line temporarily.  TPN not documented as held.  PICC positioning corrected on imaging at 15:55 post thoracentesis.  3/26 NGT removed, advancing diet.  3/27: spoke to [facility] from GI team, ok to add lipid to TPN  3/28 Travasol on short supply; instructed to use Clinisol instead  ASSESSMENT                                                                                                           Today, 01/11/2019:  Glucose (CBG goal 100-150) - well controlled, at goal on sensitive SSI  5 units Novolog so far with most recent TPN, same as yesterday  Electrolytes - Na borderline low, all others stable at goal  Renal - SCr, BUN:SCr stable WNL, bicarb borderline low but stable; UOP either low or incompletely charted  LFTs - mildly elevated on admission but had resolved; now increasing again since 3/25. Tbili elevated but relatively stable around 1.5-2.0  TGs - improved to WNL; lipids added to TPN yesterday  Prealbumin - low  Current  Nutrition - CLD starting today  IVF - none  NUTRITIONAL GOALS                                                                                             RD recs (3/26): Kcal:2100-2300 Protein:100-110g Fluid:2.1L/day  PLAN  At 1800 today:  Discussed rising LFTs with hospitalist, including possible drug-induced hepatotoxicity. Identified several potential modifications in drug therapy, but in addition wiill empirically adjust macronutrients to reduce dextrose content while maintaining kcal and protein as much as possible  New 3-in-1 TPN contains 108 g protein, 216 g dextrose, 43 g lipid, providing 1598 kcal (100% protein goals and 76% of kcal goals)  Adjusting TPN rate to 60 ml/hr to reduce excess free water given concentrated AA formulation and reduced dextrose/lipids  Add MVI, trace elements and famotidine to TPN (replacing pantoprazole d/t concern for hepatotoxicity)  Electrolytes: Increased Na and K with reduction in TPN rate, others unchanged; Cl:Ac 1:1  IVF per MD (none currently)  Reduce sensitive SSI to q8 hr CBG checks as I anticipate lower CBGs with reduced TPN and patient already well controlled  TPN lab panels on Mondays & Thursdays  BMP, Mg, Phos tomorrow AM  Bernadene Person, PharmD, BCPS [phone] 01/11/2019, 9:29 AM

## 2019-01-11 NOTE — Progress Notes (Signed)
Physical Therapy Treatment Patient Details Name: Charles Dougherty MRN: [id number] DOB: 11-Apr-1991 Today's Date: 01/11/2019    History of Present Illness 28 yo male admitted with acute necrotizing pancreatitis. Hx of ETOH abuse, drug abuse    PT Comments    Pt continues to participate well. He c/o R great toe and met head pain, rated 6/10 on today.    Follow Up Recommendations  No PT follow up;Supervision - Intermittent     Equipment Recommendations  None recommended by PT    Recommendations for Other Services       Precautions / Restrictions Precautions Precautions: Fall Restrictions Weight Bearing Restrictions: No    Mobility  Bed Mobility Overal bed mobility: Modified Independent                Transfers Overall transfer level: Modified independent                  Ambulation/Gait Ambulation/Gait assistance: Supervision Gait Distance (Feet): 1000 Feet Assistive device: Rolling walker (2 wheeled) Gait Pattern/deviations: Step-through pattern     General Gait Details: Pt requested to use RW again on today. He c/o R toe pain throughout ambulation distance. Dyspnea 2/4   Stairs             Wheelchair Mobility    Modified Rankin (Stroke Patients Only)       Balance Overall balance assessment: Mild deficits observed, not formally tested                                          Cognition Arousal/Alertness: Awake/alert Behavior During Therapy: WFL for tasks assessed/performed Overall Cognitive Status: Within Functional Limits for tasks assessed                                        Exercises      General Comments        Pertinent Vitals/Pain Pain Assessment: 0-10 Pain Score: 6  Pain Location: R great toe and met head Pain Descriptors / Indicators: Aching;Sharp;Tender Pain Intervention(s): Monitored during session    Home Living                      Prior Function            PT  Goals (current goals can now be found in the care plan section) Progress towards PT goals: Progressing toward goals    Frequency    Min 3X/week      PT Plan Current plan remains appropriate    Co-evaluation              AM-PAC PT "6 Clicks" Mobility   Outcome Measure  Help needed turning from your back to your side while in a flat bed without using bedrails?: None Help needed moving from lying on your back to sitting on the side of a flat bed without using bedrails?: None Help needed moving to and from a bed to a chair (including a wheelchair)?: None Help needed standing up from a chair using your arms (e.g., wheelchair or bedside chair)?: None Help needed to walk in hospital room?: A Little Help needed climbing 3-5 steps with a railing? : A Little 6 Click Score: 22    End of Session   Activity Tolerance: Patient tolerated treatment  well Patient left: in bed;with call bell/phone within reach   PT Visit Diagnosis: Unsteadiness on feet (R26.81)     Time: 1595-3967 PT Time Calculation (min) (ACUTE ONLY): 13 min  Charges:  $Gait Training: 8-22 mins                     Weston Anna, PT Acute Rehabilitation Services Pager: [phone] Office: [phone]

## 2019-01-11 NOTE — Progress Notes (Signed)
PROGRESS NOTE    Charles Dougherty  WJX:[id number] DOB: 1990/12/21 DOA: 12/27/2018 PCP: Patient, No Pcp Per   Brief Narrative:  Patient is Charles Dougherty 28 year old male with history of alcohol abuse, substance abuse, smoker who presents to the emergencydepartment with complaints of abdominal pain. Admitted for the management of acute pancreatitis related to alcohol.Incidentally found to havesevere hypertriglyceridemia.He has positive family history.Hospital course remarkable for abdominal discomfort, distention .Abdominal x-ray suggested early bowel obstruction. General surgery consulted and he was started on conservative management with NG tube decompression, n.p.o. status. Unclear if the pancreatitis was precipitated by alcohol or hypertriglyceridemia.Due to severe hypertriglyceridemia,westarted on insulin drip and moved to stepdown. GI also consulted.  Assessment & Plan:   Principal Problem:   Acute necrotizing pancreatitis Active Problems:   Acute alcoholic pancreatitis   Pancreatitis   ETOH abuse   Drug abuse (HCC)   Cystitis   Ileus (HCC)   Hypertriglyceridemia   Abdominal pain   SBO (small bowel obstruction) (HCC)   Hypophosphatemia   Hypomagnesemia   Hypocalcemia   AKI (acute kidney injury) (HCC)   Anemia   Fever   Pneumonia of left lower lobe due to infectious organism [facility])   1 Acute necrotizing pancreatitis secondary to alcohol and hypertriglyceridemia  Ileus Presented 3/13 with elevated lipase and triglycerides >5000 Daily drinker CT from 3/14 with acute edematous interstitial pancreatitis 3/22 CT with evidence of necrotizing pancreatitis.  New free fluid and phlegmonous/inflammatory material surrounding the pancrease extending into bilateral paracolic gutters and pelvis (see report) PT having BM's.   Pt on clears yesterday, but c/o increased abdominal distension/bloating on 3/26 PM.  Abdominal x ray repeated, suggestive of ileus.   Will clamp NGT today and try  clears again Repeat CT scan from 3/28 with severe pancreatitis with extensive pseudocyst formation throught the retroperitoneum and upper abdomen.  Biliary sludge in the gallbladder with definite findings to suggest an acute cholecystitis at this time.  Large L and small R pleural effusions. TPN for nutrition GI following, appreciate recs  Currently on meropenem in setting of fevers Morphine prn pain.  D/c toradol now (s/p 5 days).  #.  Bilateral Pleural Effusions (L>R)  Left lower lobe atelectasis/consolidation Noted on CXR from 3/25 Last fever 3/25 PM Blood and urine cx NGTD Leukocytosis present, but improving Negative urine strep and legionella Currently on meropenem for possible pneumonia and necrotizing pancreatitis S/p thoracentesis by IR on 3/26, with 640 cc hazy dark amber/tea colored fluid Exudative by light's.  Follow pending culture (NGTDx2), pH 7.5, and cytology (reactive appearing mesothelial cells).  Follow add on amylase.  Possibly 2/2 acute pancreatitis? Repeat CXR 3/28 given effusions on CT 3/27.  May need repeat thora.  2.  Severe hypertriglyceridemia Patient with family history of hypertriglyceridemia. Patient was placed on the insulin drip with significant improvement with elevated triglyceride levels.   Repeat tomorrow Will need fibrate on d/c  3.  Small bowel obstruction versus ileus secondary to pancreatitis Patient with 1.1 L output from NG tube over the past 24 hours.   Abdominal films 3/24 with ileus vs SBO, suspect ileus more likely as pt having BM's Continue simethicone 160 mg p.o. 4 times daily.   Patient started on IV Reglan per gastroenterology.   CT abdomen/pelvis as noted above Clamp NG and trial clears  6.  Fever Likely 2/2 above.  Currently on abx for possible pneumonia and necrotizing pancreatitis.   S/p thora as noted above Blood and urine cx ngtd. May need repeat imaging (planning for discussion  with GI today). Temp elevated to 100.3 this  3/27, but last true fever was 3/25.  Continue to monitor. Will discuss long term abx plan with GI today  7.  Hypophosphatemia/hypokalemia Replace and follow.   8.  Anemia S/p 1 unit on 3/19 and 1 unit on 3/24  Likely dilutional and secondary to worsening pancreatitis.   Patient with no overt bleeding.   Anemia panel consistent with anemia of chronic disease/iron deficiency anemia.  FOBT negative.   Per GI upper endoscopy to be done in the outpatient setting.  GI following.  9.  Hypocalcemia/hypoalbuminemia Hypocalcemia corrects with albumin Continue to monitor with TPN   10.  Transaminitis Images done did not show any gallbladder stones or inflammation.  Patient noted to have diffuse fatty liver.   LFT's now trending back up, continue to monitor closely.  Repeat imaging as noted above.  Discussed with pharmacy regarding meds, no obvious causes, but will change PPI to pepcid.  Will plan to discuss with GI as well.  11.  Thrombocytopenia  Thrombocytosis Likely secondary to alcoholic liver disease.  Lovenox discontinued.  No overt bleeding.  Now with elevated platelets.  12.  Suspected cystitis Per CT imaging.  Patient currently asymptomatic.  Urinalysis unremarkable.   13.  Acute kidney injury Resolved with hydration.  Follow.   14.  Hypoglycemic events 2/2 insulin gtt and NPO status. Now on TPN Follow   15 alcohol abuse No signs of withdrawal.  Alcohol cessation stressed to patient.  Social work consulted.  Follow.  16.  Malnutrition Patient presented with acute pancreatitis.  Has essentially been n.p.o. since admission.  Prealbumin of 7.0.  TPN ordered and started the evening of 01/06/2019.  Patient also placed on IV albumin every 6 hours x1 day.  GI following.  # PICC in place, imaging distal PICC tip flipped back and could be entering azygos vein.  Now better positioned.  # Insomnia: benadryl ordered for insomia prn (ambien/restoril have not helped)  #  Hypertension: start amlodipine  # Sinus Tachycardia: likely 2/2 above, continue to monitor  DVT prophylaxis: SCD Code Status: full  Family Communication: none at bedside Disposition Plan: pending improvement   Consultants:   GI  IR  Procedures:   none  Antimicrobials:  Anti-infectives (From admission, onward)   Start     Dose/Rate Route Frequency Ordered Stop   01/06/19 1800  meropenem (MERREM) 1 g in sodium chloride 0.9 % 100 mL IVPB     1 g 200 mL/hr over 30 Minutes Intravenous Every 8 hours 01/06/19 1722     01/05/19 1800  levofloxacin (LEVAQUIN) IVPB 750 mg  Status:  Discontinued     750 mg 100 mL/hr over 90 Minutes Intravenous Every 24 hours 01/05/19 0954 01/05/19 1000   01/05/19 1800  levofloxacin (LEVAQUIN) IVPB 500 mg  Status:  Discontinued     500 mg 100 mL/hr over 60 Minutes Intravenous Every 24 hours 01/05/19 1012 01/05/19 1041   01/05/19 1130  levofloxacin (LEVAQUIN) IVPB 750 mg  Status:  Discontinued     750 mg 100 mL/hr over 90 Minutes Intravenous Daily 01/05/19 1041 01/06/19 1715   01/04/19 1800  levofloxacin (LEVAQUIN) IVPB 500 mg  Status:  Discontinued     500 mg 100 mL/hr over 60 Minutes Intravenous Every 24 hours 01/04/19 1612 01/05/19 0954     Subjective: Feels Charles Dougherty bit better today. Ok with trialing clears.  Objective: Vitals:   01/10/19 0256 01/10/19 0550 01/10/19 1750 01/10/19 2052  BP: Marland Kitchen)  140/92 (!) 144/95 (!) 143/99 (!) 146/94  Pulse: (!) 106 (!) 119 (!) 116 (!) 117  Resp: (!) 28 18 (!) 21 20  Temp:  100.3 F (37.9 C) 98.5 F (36.9 C) 99.6 F (37.6 C)  TempSrc:  Oral  Oral  SpO2: 94% 91% 95% 95%  Weight:    75.5 kg  Height:        Intake/Output Summary (Last 24 hours) at 01/11/2019 0959 Last data filed at 01/11/2019 0930 Gross per 24 hour  Intake 1130 ml  Output 1275 ml  Net -145 ml   Filed Weights   01/07/19 0351 01/08/19 0500 01/10/19 2052  Weight: 77.3 kg 77.6 kg 75.5 kg    Examination:  General: No acute distress.  Cardiovascular: Heart sounds show Nickolaos Brallier tachycardic rate Lungs: Clear to auscultation bilaterally Abdomen: distended, but less so than yesterday.  No sig ttp. Neurological: Alert and oriented 3. Moves all extremities 4. Cranial nerves II through XII grossly intact. Skin: Warm and dry. No rashes or lesions. Extremities: No clubbing or cyanosis. No edema.  Psychiatric: Mood and affect are normal. Insight and judgment are appropriate.    Data Reviewed: I have personally reviewed following labs and imaging studies  CBC: Recent Labs  Lab 01/06/19 0503 01/07/19 0830 01/07/19 1630 01/08/19 0515 01/09/19 0544 01/10/19 0915 01/11/19 0426  WBC 13.5* 13.0*  --  12.7* 14.6* 12.3* 12.7*  NEUTROABS 11.1* 11.2*  --  11.2*  --   --   --   HGB 7.5* 6.6* 7.9* 8.2* 8.7* 7.7* 8.4*  HCT 24.1* 20.5* 24.2* 25.5* 26.9* 26.7* 27.5*  MCV 105.7* 99.5  --  97.3 98.5 109.0* 101.5*  PLT 410* 488*  --  530* 572* 580* 657*   Basic Metabolic Panel: Recent Labs  Lab 01/07/19 0314 01/08/19 0515 01/09/19 0544 01/10/19 0336 01/11/19 0426  NA 135 138 133* 135 134*  K 3.4* 3.3* 4.3 4.2 4.1  CL 103 104 104 106 103  CO2 23 24 22  21* 22  GLUCOSE 148* 127* 137* 135* 122*  BUN 8 11 15 14 13   CREATININE 0.67 0.64 0.54* 0.49* 0.59*  CALCIUM 8.0* 8.3* 8.0* 8.2* 8.0*  MG 2.1 2.3 2.1 2.0 2.2  PHOS 3.4 4.2 4.0 3.6 4.3   GFR: Estimated Creatinine Clearance: 145.7 mL/min (Rilley Poulter) (by C-G formula based on SCr of 0.59 mg/dL (L)). Liver Function Tests: Recent Labs  Lab 01/06/19 0503 01/08/19 0515 01/09/19 0544 01/10/19 0336 01/11/19 0426  AST 30 60* 68* 104* 157*  ALT 27 49* 80* 128* 201*  ALKPHOS 63 81 98 86 112  BILITOT 1.6* 2.0* 1.5* 1.7* 1.7*  PROT 5.0* 5.6* 5.6* 5.7* 5.8*  ALBUMIN 2.0* 2.8* 2.7* 2.5* 2.4*   Recent Labs  Lab 01/06/19 0503  LIPASE 47   No results for input(s): AMMONIA in the last 168 hours. Coagulation Profile: Recent Labs  Lab 01/06/19 0503  INR 1.3*   Cardiac Enzymes: No  results for input(s): CKTOTAL, CKMB, CKMBINDEX, TROPONINI in the last 168 hours. BNP (last 3 results) No results for input(s): PROBNP in the last 8760 hours. HbA1C: No results for input(s): HGBA1C in the last 72 hours. CBG: Recent Labs  Lab 01/10/19 0742 01/10/19 1223 01/10/19 1647 01/10/19 2359 01/11/19 0605  GLUCAP 145* 142* 121* 125* 133*   Lipid Profile: Recent Labs    01/09/19 0544 01/11/19 0426  TRIG 129 149   Thyroid Function Tests: No results for input(s): TSH, T4TOTAL, FREET4, T3FREE, THYROIDAB in the last 72 hours. Anemia Panel:  No results for input(s): VITAMINB12, FOLATE, FERRITIN, TIBC, IRON, RETICCTPCT in the last 72 hours. Sepsis Labs: Recent Labs  Lab 01/08/19 0515 01/08/19 0800  LATICACIDVEN 1.3 1.3    Recent Results (from the past 240 hour(s))  Culture, blood (Routine X 2) w Reflex to ID Panel     Status: None   Collection Time: 01/05/19  9:05 AM  Result Value Ref Range Status   Specimen Description   Final    BLOOD RIGHT HAND Performed at [facility], [address] [place], [place] [address]    Special Requests   Final    BOTTLES DRAWN AEROBIC ONLY Blood Culture adequate volume Performed at [facility], [address], [place], [place] [address]    Culture   Final    NO GROWTH 5 DAYS Performed at [facility], [address], [place], [place] [address]    Report Status 01/10/2019 FINAL  Final  Culture, blood (Routine X 2) w Reflex to ID Panel     Status: None   Collection Time: 01/05/19  9:05 AM  Result Value Ref Range Status   Specimen Description   Final    BLOOD LEFT HAND Performed at [facility], [address] [place], [place] [address]    Special Requests   Final    BOTTLES DRAWN AEROBIC ONLY Blood Culture adequate volume Performed at [facility], [address], [place], [place] [address]    Culture   Final    NO GROWTH 5 DAYS Performed at  [facility], [address], [place], [place] [address]    Report Status 01/10/2019 FINAL  Final  Culture, Urine     Status: None   Collection Time: 01/05/19 10:55 AM  Result Value Ref Range Status   Specimen Description   Final    URINE, CLEAN CATCH Performed at [facility], [address], [place], [place] [address]    Special Requests   Final    NONE Performed at [facility], [address], [place], [place] [address]    Culture   Final    NO GROWTH Performed at [facility], [address] [place], [place] [address]    Report Status 01/06/2019 FINAL  Final  Culture, body fluid-bottle     Status: None (Preliminary result)   Collection Time: 01/08/19  3:48 PM  Result Value Ref Range Status   Specimen Description PLEURAL  Final   Special Requests NONE  Final   Culture   Final    NO GROWTH 2 DAYS Performed at [facility], [address], [place], [place] [address]    Report Status PENDING  Incomplete  Gram stain     Status: None   Collection Time: 01/08/19  3:48 PM  Result Value Ref Range Status   Specimen Description PLEURAL  Final   Special Requests NONE  Final   Gram Stain   Final    MODERATE WBC PRESENT, PREDOMINANTLY PMN NO ORGANISMS SEEN Performed at [facility], [address], [place], [place] [address]    Report Status 01/08/2019 FINAL  Final         Radiology Studies: Ct Abdomen Pelvis W Wo Contrast  Result Date: 01/10/2019 CLINICAL DATA:  28 year old male with history of necrotizing pancreatitis. History of alcohol and substance abuse. Abdominal pain. EXAM: CT ABDOMEN AND PELVIS WITHOUT AND WITH CONTRAST TECHNIQUE: Multidetector CT imaging of  the abdomen and pelvis was performed following the standard protocol before and following the bolus administration of intravenous contrast. CONTRAST:  100mL OMNIPAQUE IOHEXOL 300 MG/ML  SOLN COMPARISON:  CT the abdomen and pelvis 01/05/2019. FINDINGS:  Lower chest: Large left and small right pleural effusions lying dependently with associated areas of passive subsegmental atelectasis throughout the lower lobes of the lungs bilaterally. Nasogastric tube extending into the stomach. Hepatobiliary: No suspicious cystic or solid hepatic lesions. No intra or extrahepatic biliary ductal dilatation. Amorphous high attenuation material in the gallbladder, likely to reflect biliary sludge. Gallbladder does not appear distended. No calcified gallstones in the gallbladder. Pancreas: No pancreatic mass. Extensive peripancreatic fluid and inflammatory changes again noted. These peripancreatic fluid collections extend all the way around the pancreas as well as adjacent portions of duodenum, with extension laterally throughout the retroperitoneum bilaterally, with some extension caudally into the anatomic pelvis tracking laterally to the psoas musculature. The extent of these collections is very similar to the recent CT 01/05/2019. Pancreatic parenchyma appears to enhance normally. No pancreatic ductal dilatation. Spleen: Unremarkable. Adrenals/Urinary Tract: Bilateral kidneys and adrenal glands are normal in appearance. No hydroureteronephrosis. Urinary bladder is normal in appearance. Stomach/Bowel: Nasogastric tube tip in the body of the stomach. Stomach is deformed related to perigastric fluid collections, most compatible with extensive pancreatic pseudocysts. Some dilated loops of mid small bowel (jejunum) are noted measuring up to 4.7 cm in diameter with some small air-fluid levels, presumably Cheick Suhr regional ileus. No pathologic dilatation of distal small bowel or colon. Appendix is not confidently identified may be surgically absent. Vascular/Lymphatic: No significant atherosclerotic disease, aneurysm or dissection noted in the abdominal or pelvic vasculature. No lymphadenopathy noted in the abdomen or pelvis. Reproductive: Prostate gland and seminal vesicles are unremarkable  in appearance. Other: Small volume of ascites.  No pneumoperitoneum. Musculoskeletal: There are no aggressive appearing lytic or blastic lesions noted in the visualized portions of the skeleton. IMPRESSION: 1. Severe pancreatitis with extensive pseudocyst formation throughout the retroperitoneum and upper abdomen, similar to the prior study, as detailed above. 2. At this time, the pancreatic parenchyma appears to enhance normally. 3. Biliary sludge in the gallbladder without definite findings to suggest an acute cholecystitis at this time. 4. Large left and small left pleural effusions lying dependently with extensive areas of passive atelectasis throughout the lung bases. 5. Small volume of ascites. Electronically Signed   By: Trudie Reedaniel  Entrikin M.D.   On: 01/10/2019 15:49   Dg Abd 1 View  Result Date: 01/09/2019 CLINICAL DATA:  Abdominal distension. EXAM: ABDOMEN - 1 VIEW COMPARISON:  Radiograph 01/07/2019. CT 01/05/2019 FINDINGS: Similar degree of proximal small bowel dilatation from prior exam. Slight increased distal small bowel gas from prior. Increased air in the transverse colon from prior. No evidence of free air on portable supine view. IMPRESSION: Gaseous proximal small bowel distention is similar to prior exams. Slight increased air in nondilated distal small bowel and colon. Overall findings suggest with ileus rather than obstruction. Electronically Signed   By: Narda RutherfordMelanie  Sanford M.D.   On: 01/09/2019 23:03   Dg Abd Portable 1v  Result Date: 01/10/2019 CLINICAL DATA:  28 year old male NG tube placement. EXAM: PORTABLE ABDOMEN - 1 VIEW COMPARISON:  2227 hours the same day. FINDINGS: Portable AP supine view at 2328 hours. Enteric tube has been placed into the left upper quadrant with side hole at the level of the gastric body. Continued abnormal gas pattern with dilated small bowel loops up to 8 centimeters diameter, gas in  nondilated ascending and transverse colon. No osseous abnormality identified.  Partial left lung base opacity redemonstrated. No pneumoperitoneum evident on this supine image. IMPRESSION: 1. Enteric tube placed into the stomach, side hole the level of the gastric body. 2. Abnormal bowel-gas pattern not significantly changed. Electronically Signed   By: Odessa Fleming M.D.   On: 01/10/2019 00:10        Scheduled Meds: . amLODipine  5 mg Oral Daily  . Chlorhexidine Gluconate Cloth  6 each Topical Daily  . Chlorhexidine Gluconate Cloth  6 each Topical Daily  . feeding supplement  1 Container Oral TID BM  . insulin aspart  0-9 Units Subcutaneous Q6H  . metoCLOPramide (REGLAN) injection  10 mg Intravenous Q8H  . nicotine  14 mg Transdermal Daily  . simethicone  160 mg Oral QID  . sodium chloride flush  10-40 mL Intracatheter Q12H   Continuous Infusions: . sodium chloride 1,000 mL (01/08/19 1645)  . famotidine (PEPCID) IV    . meropenem (MERREM) IV Stopped (01/11/19 0352)  . TPN ADULT (ION) 85 mL/hr at 01/10/19 1725     LOS: 14 days    Time spent: over 30 min    Lacretia Nicks, MD [facility] Pager AMION  If 7PM-7AM, please contact night-coverage [email or web address] Password [facility] 01/11/2019, 9:59 AM

## 2019-01-11 NOTE — Progress Notes (Signed)
Pt remains in the 110's.

## 2019-01-12 LAB — BASIC METABOLIC PANEL
Anion gap: 11 (ref 5–15)
BUN: 16 mg/dL (ref 6–20)
CO2: 22 mmol/L (ref 22–32)
Calcium: 8.2 mg/dL — ABNORMAL LOW (ref 8.9–10.3)
Chloride: 100 mmol/L (ref 98–111)
Creatinine, Ser: 0.62 mg/dL (ref 0.61–1.24)
GFR calc non Af Amer: >60 mL/min (ref >60)
Glucose, Bld: 102 mg/dL — ABNORMAL HIGH (ref 70–99)
Potassium: 4.1 mmol/L (ref 3.5–5.1)
Sodium: 133 mmol/L — ABNORMAL LOW (ref 135–145)

## 2019-01-12 LAB — CBC
HCT: 27.5 % — ABNORMAL LOW (ref 39.0–52.0)
HCT: 28.5 % — ABNORMAL LOW (ref 39.0–52.0)
Hemoglobin: 8.4 g/dL — ABNORMAL LOW (ref 13.0–17.0)
Hemoglobin: 9 g/dL — ABNORMAL LOW (ref 13.0–17.0)
MCH: 31 pg (ref 26.0–34.0)
MCH: 31.7 pg (ref 26.0–34.0)
MCHC: 30.5 g/dL (ref 30.0–36.0)
MCHC: 31.6 g/dL (ref 30.0–36.0)
MCV: 101.5 fL — ABNORMAL HIGH (ref 80.0–100.0)
MCV: 100.4 fL — ABNORMAL HIGH (ref 80.0–100.0)
Platelets: 657 10*3/uL — ABNORMAL HIGH (ref 150–400)
Platelets: 660 10*3/uL — ABNORMAL HIGH (ref 150–400)
RBC: 2.71 MIL/uL — ABNORMAL LOW (ref 4.22–5.81)
RBC: 2.84 MIL/uL — ABNORMAL LOW (ref 4.22–5.81)
RDW: 13.5 % (ref 11.5–15.5)
RDW: 13.4 % (ref 11.5–15.5)
WBC: 12.7 10*3/uL — AB (ref 4.0–10.5)
WBC: 13.1 10*3/uL — AB (ref 4.0–10.5)
nRBC: 0 % (ref 0.0–0.2)
nRBC: 0 % (ref 0.0–0.2)

## 2019-01-12 LAB — GLUCOSE, CAPILLARY
Glucose-Capillary: 150 mg/dL — ABNORMAL HIGH (ref 70–99)
Glucose-Capillary: 49 mg/dL — ABNORMAL LOW (ref 70–99)
Glucose-Capillary: 124 mg/dL — ABNORMAL HIGH (ref 70–99)
Glucose-Capillary: 132 mg/dL — ABNORMAL HIGH (ref 70–99)
Glucose-Capillary: 102 mg/dL — ABNORMAL HIGH (ref 70–99)

## 2019-01-12 LAB — HEPATIC FUNCTION PANEL
ALK PHOS: 117 U/L (ref 38–126)
ALT: 161 U/L — ABNORMAL HIGH (ref 0–44)
AST: 97 U/L — ABNORMAL HIGH (ref 15–41)
Albumin: 2.5 g/dL — ABNORMAL LOW (ref 3.5–5.0)
Bilirubin, Direct: 1.1 mg/dL — ABNORMAL HIGH (ref 0.0–0.2)
Indirect Bilirubin: 0.7 mg/dL (ref 0.3–0.9)
Total Bilirubin: 1.8 mg/dL — ABNORMAL HIGH (ref 0.3–1.2)
Total Protein: 5.9 g/dL — ABNORMAL LOW (ref 6.5–8.1)

## 2019-01-12 LAB — TRIGLYCERIDES: Triglycerides: 157 mg/dL — ABNORMAL HIGH (ref <150)

## 2019-01-12 LAB — MAGNESIUM: MAGNESIUM: 2.1 mg/dL (ref 1.7–2.4)

## 2019-01-12 LAB — PHOSPHORUS: Phosphorus: 5.1 mg/dL — ABNORMAL HIGH (ref 2.5–4.6)

## 2019-01-12 MED ORDER — CYCLOBENZAPRINE HCL 5 MG PO TABS
5.0000 mg | ORAL_TABLET | Freq: Two times a day (BID) | ORAL | Status: DC | PRN
  Administered 2019-01-12 – 2019-01-17 (×5): 5 mg via ORAL
  Filled 2019-01-12 (×5): qty 1

## 2019-01-12 MED ORDER — MORPHINE SULFATE (PF) 2 MG/ML IV SOLN
2.0000 mg | Freq: Once | INTRAVENOUS | Status: AC
  Administered 2019-01-12: 2 mg via INTRAVENOUS
  Filled 2019-01-12: qty 1

## 2019-01-12 MED ORDER — TRACE MINERALS CR-CU-MN-SE-ZN 10-1000-500-60 MCG/ML IV SOLN
INTRAVENOUS | Status: AC
  Administered 2019-01-12: 18:00:00 via INTRAVENOUS
  Filled 2019-01-12: qty 720

## 2019-01-12 MED ORDER — MORPHINE SULFATE (PF) 4 MG/ML IV SOLN
4.0000 mg | INTRAVENOUS | Status: DC | PRN
  Administered 2019-01-12 – 2019-01-18 (×32): 4 mg via INTRAVENOUS
  Filled 2019-01-12 (×32): qty 1

## 2019-01-12 NOTE — Progress Notes (Signed)
PHARMACY - ADULT TOTAL PARENTERAL NUTRITION CONSULT NOTE   Pharmacy Consult for TPN Indication: Severe Pancreatitis  Patient Measurements: Height: 5\' 10"  (177.8 cm) Weight: 166 lb 7.2 oz (75.5 kg) IBW/kg (Calculated) : 73 TPN AdjBW (KG): 78 Body mass index is 23.88 kg/m. Usual Weight: 70.4 kg  HPI: 25 yoM admitted on 3/14 with pancreatitis (precipitated by hypertriglyceridemia).Hypertriglyceridemia was treated with insulin drip.Abdominal x-ray suggested early bowel obstruction.NGT remains on suction. Unable to tolerate clears.  Central access: PICC 3/23 TPN start date: 3/23  Significant events:   3/21 Reglan added  3/22:TPN ordered but unable to start today d/t ordering cut off time of 12:00,PICC line was ordered, butnot placed, concern with leukocytosis  3/23 Albumin x 4 doses; TPN started without lipids  3/25 bilateral pleural effusions s/p L thoracentesis, out. PICC with possible malpositioning - held use of PICC line temporarily.  TPN not documented as held.  PICC positioning corrected on imaging at 15:55 post thoracentesis.  3/26 NGT removed, advancing diet.  3/27: spoke to [facility] from GI team, ok to add lipid to TPN  3/28 Travasol on short supply; instructed to use Clinisol instead  ASSESSMENT                                                                                                           Today, 01/12/2019:  Glucose (CBG goal 100-150) - well controlled, at goal on sensitive SSI  2 units Novolog so far with most recent TPN, decreased from yesterday  Electrolytes - Na remains borderline low, Phos now high, all others stable at goal  Renal - SCr, BUN:SCr stable WNL, bicarb borderline low but stable; UOP adequate  LFTs - improved today after a second mild spike this admission; peaked yesterday at 3-4x ULN. Tbili remains elevated around 1.5-2.0  TGs - borderline high today (~150); admitted with TG > 5000; note lipids added to TPN 2  days ago  Prealbumin - low, decreased since admission  Current Nutrition - CLD since yesterday, NGT mostly clamped but per RN have had to unclamp on 2 occasions since yesterday  IVF - none  NUTRITIONAL GOALS                                                                                             RD recs (3/26): Kcal:2100-2300 Protein:100-110g Fluid:2.1L/day  PLAN  At 1800 today:  Continue TPN at 60 ml/hr. Had modified TPN yesterday to reduce dextrose (and lipids somewhat) in response to rising LFTs. Not clear if TPN is related to transaminitis, although LFTs did begin to rise with initiation of TPN. If LFTs continue to improve, will cautiously titrate TPN back to full rate (unless able to transition to PO intake)  New 3-in-1 TPN contains 108 g protein, 216 g dextrose, 43 g lipid, providing 1598 kcal (100% protein goals and 76% of kcal goals)  Add MVI, trace elements and famotidine to TPN (replacing pantoprazole d/t concern for hepatotoxicity)  Electrolytes: Increase Na, reduce phos; continue others unchanged; Cl:Ac 1:1  IVF per MD (none currently)  Continue sensitive SSI with CBG checks q8 hr as long as patient well controlled  TPN lab panels on Mondays & Thursdays  Bernadene Person, PharmD, BCPS [phone] 01/12/2019, 8:40 AM

## 2019-01-12 NOTE — Progress Notes (Signed)
[facility] Progress Note   Chief Complaint:  Severe pancreatitis  SUBJECTIVE:    tolerating liquids, NGT has remained clamped, + flatus. No abdominal pain but intermittent left lower back pain depending on position   ASSESSMENT AND PLAN:   1. 28 yo male with severe acute necrotizing pancreatitis ( Etoh + hypertriglyceridemia) with inflammatory / necrotic fluid collection complicated by pleural effusion / ileus. His is s/p thoracentesis 01/08/19  No organisms on gram stain, no growth on culture in 3 days. Repeat CT scan two days ago >>> severe pancreatitis with extensive pseudocyst formation similar to prior study. Biliary sludge in gallbladder,  large left pleural effusion, small volume ascites. -Afebrile. WBC up slightly overnight 12.7 >> 13.1 -Continue clear liquids -Continue TNA for now -Antibiotics stopped yesterday -Transaminases improved overnight.  2. Intermittent left lower back pain (severe at times).. It is worse in bed, especially when trying to sleep. Feels better when sitting up crossing legs and doesn't bother him when ambulating.  Pain being referred from pancreas? Musculoskeletal?  -trial of low dose Flexeril , 5mg  BID prn  3. Anemia. Likely dilutional but did have some rectal bleeding at one point. Ferritin 1383 (inflammation driven). Got u PRBC on 3/19 and 3/24.  4. Sinus tachycardia, asymptomatic.    5. Anemia, stable. Hgb up from 8.4 >>> 9.0  OBJECTIVE:     Vital signs in last 24 hours: Temp:  [98.4 F (36.9 C)-98.9 F (37.2 C)] 98.6 F (37 C) (03/29 0550) Pulse Rate:  [60-123] 117 (03/29 0550) Resp:  [16-20] 18 (03/29 0550) BP: (121-141)/(62-94) 125/81 (03/29 0550) SpO2:  [93 %-100 %] 94 % (03/29 0550) Last BM Date: 01/09/19 General:   Alert, well-developed male in NAD. Clamped NGT EENT:  Normal hearing, non icteric sclera, conjunctive pink.  Heart:  Sinus tachycardia; no murmur.  No lower extremity edema   Pulm: Normal respiratory  effort, Decrease breath sounds at both bases. Abdomen:  Soft, mildly distended.Nontender.  Bowel sounds present, a few "metallic" ones heard. .       Neurologic:  Alert and  oriented x4;  grossly normal neurologically. Psych:  Pleasant, cooperative.  Normal mood and affect.   Intake/Output from previous day: 03/28 0701 - 03/29 0700 In: 1235 [P.O.:240; I.V.:995] Out: 1950 [Urine:1650] Intake/Output this shift: No intake/output data recorded.  Lab Results: Recent Labs    01/10/19 0915 01/11/19 0426 01/12/19 0407  WBC 12.3* 12.7* 13.1*  HGB 7.7* 8.4* 9.0*  HCT 26.7* 27.5* 28.5*  PLT 580* 657* 660*   BMET Recent Labs    01/10/19 0336 01/11/19 0426 01/12/19 0407  NA 135 134* 133*  K 4.2 4.1 4.1  CL 106 103 100  CO2 21* 22 22  GLUCOSE 135* 122* 102*  BUN 14 13 16   CREATININE 0.49* 0.59* 0.62  CALCIUM 8.2* 8.0* 8.2*   LFT Recent Labs    01/12/19 0407  PROT 5.9*  ALBUMIN 2.5*  AST 97*  ALT 161*  ALKPHOS 117  BILITOT 1.8*  BILIDIR 1.1*  IBILI 0.7   PT/INR No results for input(s): LABPROT, INR in the last 72 hours. Hepatitis Panel No results for input(s): HEPBSAG, HCVAB, HEPAIGM, HEPBIGM in the last 72 hours.  Ct Abdomen Pelvis W Wo Contrast  Result Date: 01/10/2019 CLINICAL DATA:  28 year old male with history of necrotizing pancreatitis. History of alcohol and substance abuse. Abdominal pain. EXAM: CT ABDOMEN AND PELVIS WITHOUT AND WITH CONTRAST TECHNIQUE: Multidetector CT imaging of the abdomen and pelvis was performed following  the standard protocol before and following the bolus administration of intravenous contrast. CONTRAST:  OMNIPAQUE IOHEXOL 300 MG/ML  SOLN COMPARISON:  CT the abdomen and pelvis 01/05/2019. FINDINGS: Lower chest: Large left and small right pleural effusions lying dependently with associated areas of passive subsegmental atelectasis throughout the lower lobes of the lungs bilaterally. Nasogastric tube extending into the stomach.  Hepatobiliary: No suspicious cystic or solid hepatic lesions. No intra or extrahepatic biliary ductal dilatation. Amorphous high attenuation material in the gallbladder, likely to reflect biliary sludge. Gallbladder does not appear distended. No calcified gallstones in the gallbladder. Pancreas: No pancreatic mass. Extensive peripancreatic fluid and inflammatory changes again noted. These peripancreatic fluid collections extend all the way around the pancreas as well as adjacent portions of duodenum, with extension laterally throughout the retroperitoneum bilaterally, with some extension caudally into the anatomic pelvis tracking laterally to the psoas musculature. The extent of these collections is very similar to the recent CT 01/05/2019. Pancreatic parenchyma appears to enhance normally. No pancreatic ductal dilatation. Spleen: Unremarkable. Adrenals/Urinary Tract: Bilateral kidneys and adrenal glands are normal in appearance. No hydroureteronephrosis. Urinary bladder is normal in appearance. Stomach/Bowel: Nasogastric tube tip in the body of the stomach. Stomach is deformed related to perigastric fluid collections, most compatible with extensive pancreatic pseudocysts. Some dilated loops of mid small bowel (jejunum) are noted measuring up to 4.7 cm in diameter with some small air-fluid levels, presumably a regional ileus. No pathologic dilatation of distal small bowel or colon. Appendix is not confidently identified may be surgically absent. Vascular/Lymphatic: No significant atherosclerotic disease, aneurysm or dissection noted in the abdominal or pelvic vasculature. No lymphadenopathy noted in the abdomen or pelvis. Reproductive: Prostate gland and seminal vesicles are unremarkable in appearance. Other: Small volume of ascites.  No pneumoperitoneum. Musculoskeletal: There are no aggressive appearing lytic or blastic lesions noted in the visualized portions of the skeleton. IMPRESSION: 1. Severe pancreatitis  with extensive pseudocyst formation throughout the retroperitoneum and upper abdomen, similar to the prior study, as detailed above. 2. At this time, the pancreatic parenchyma appears to enhance normally. 3. Biliary sludge in the gallbladder without definite findings to suggest an acute cholecystitis at this time. 4. Large left and small left pleural effusions lying dependently with extensive areas of passive atelectasis throughout the lung bases. 5. Small volume of ascites. Electronically Signed   By: Trudie Reed M.D.   On: 01/10/2019 15:49   Dg Chest 2 View  Result Date: 01/11/2019 CLINICAL DATA:  Shortness of breath and chest pain. Re-evaluate pleural effusion. EXAM: CHEST - 2 VIEW COMPARISON:  01/08/2019 FINDINGS: Left PICC line tip at low SVC.  Nasogastric tip at gastric fundus. Midline trachea. Normal heart size. Slight increase in small left pleural effusion. No pneumothorax. Minimal right infrahilar atelectasis. Patchy left base airspace disease. IMPRESSION: Slight increase in small left pleural effusion with persistent adjacent left lower lobe atelectasis. Electronically Signed   By: Jeronimo Greaves M.D.   On: 01/11/2019 10:51    Principal Problem:   Acute necrotizing pancreatitis Active Problems:   Acute alcoholic pancreatitis   Pancreatitis   ETOH abuse   Drug abuse (HCC)   Cystitis   Ileus (HCC)   Hypertriglyceridemia   Abdominal pain   SBO (small bowel obstruction) (HCC)   Hypophosphatemia   Hypomagnesemia   Hypocalcemia   AKI (acute kidney injury) (HCC)   Anemia   Fever   Pneumonia of left lower lobe due to infectious organism (HCC)   Abnormal liver enzymes  LOS: 15 days   Willette Cluster ,NP 01/12/2019, 9:32 AM

## 2019-01-12 NOTE — Progress Notes (Signed)
PROGRESS NOTE    Charles Dougherty  JME:[id number] DOB: [birth year] DOA: 12/27/2018 PCP: Patient, No Pcp Per   Brief Narrative:  Patient is Charles Dougherty 28 year old male with history of alcohol abuse, substance abuse, smoker who presents to the emergencydepartment with complaints of abdominal pain. Admitted for the management of acute pancreatitis related to alcohol.Incidentally found to havesevere hypertriglyceridemia.He has positive family history.Hospital course remarkable for abdominal discomfort, distention .Abdominal x-ray suggested early bowel obstruction. General surgery consulted and he was started on conservative management with NG tube decompression, n.p.o. status. Unclear if the pancreatitis was precipitated by alcohol or hypertriglyceridemia.Due to severe hypertriglyceridemia,westarted on insulin drip and moved to stepdown. GI also consulted.  Assessment & Plan:   Principal Problem:   Acute necrotizing pancreatitis Active Problems:   Acute alcoholic pancreatitis   Pancreatitis   ETOH abuse   Drug abuse (HCC)   Cystitis   Ileus (HCC)   Hypertriglyceridemia   Abdominal pain   SBO (small bowel obstruction) (HCC)   Hypophosphatemia   Hypomagnesemia   Hypocalcemia   AKI (acute kidney injury) (HCC)   Anemia   Fever   Pneumonia of left lower lobe due to infectious organism (HCC)   Abnormal liver enzymes   1 Acute necrotizing pancreatitis secondary to alcohol and hypertriglyceridemia  Ileus Presented 3/13 with elevated lipase and triglycerides >5000 Daily drinker CT from 3/14 with acute edematous interstitial pancreatitis 3/22 CT with evidence of necrotizing pancreatitis.  New free fluid and phlegmonous/inflammatory material surrounding the pancrease extending into bilateral paracolic gutters and pelvis (see report) PT having BM's.   Pt on clears yesterday, but c/o increased abdominal distension/bloating on 3/26 PM.  Abdominal x ray repeated, suggestive of ileus.   Clamp  NGT.  Continue clears. Repeat CT scan from 3/28 with severe pancreatitis with extensive pseudocyst formation throught the retroperitoneum and upper abdomen.  Biliary sludge in the gallbladder with definite findings to suggest an acute cholecystitis at this time.  Large L and small R pleural effusions. TPN for nutrition GI following, appreciate recs  D/c meropenem.  Morphine prn pain (increased dose due to back pain).  D/c toradol now (s/p 5 days).  #.  Bilateral Pleural Effusions (L>R)  Left lower lobe atelectasis/consolidation Noted on CXR from 3/25 Last fever 3/25 PM Blood and urine cx NGTD Leukocytosis present, but improving Negative urine strep and legionella Currently on meropenem for possible pneumonia and necrotizing pancreatitis S/p thoracentesis by IR on 3/26, with 640 cc hazy dark amber/tea colored fluid Exudative by light's.  Follow pending culture (NGTDx4), pH 7.5, and cytology (reactive appearing mesothelial cells).  Follow add on amylase.  Possibly 2/2 acute pancreatitis? Repeat CXR 3/28 (notable for small L pleural effusion with persistent LLL atelectasis) given effusions on CT 3/27.   Will hold off on repeat thora for now, consider repeat CXR in [place] few days.   2.  Severe hypertriglyceridemia Patient with family history of hypertriglyceridemia. Patient was placed on the insulin drip with significant improvement with elevated triglyceride levels.   Repeat tomorrow Will need fibrate on d/c  3.  Small bowel obstruction versus ileus secondary to pancreatitis Patient with 1.1 L output from NG tube over the past 24 hours.   Abdominal films 3/24 with ileus vs SBO, suspect ileus more likely as pt having BM's Continue simethicone 160 mg p.o. 4 times daily.   Patient started on IV Reglan per gastroenterology.   CT abdomen/pelvis as noted above Clamp NG and trial clears  6.  Fever Likely 2/2 above.  Currently  on abx for possible pneumonia and necrotizing pancreatitis.   S/p  thora as noted above Blood and urine cx ngtd. May need repeat imaging (planning for discussion with GI today). Afebrile since 3/25 until this afternoon.  Abx currently on hold.  Will continue to monitor for now, consider repeat cx and abx if recurrent.  7.  Hypophosphatemia/hypokalemia Replace and follow.   8.  Anemia S/p 1 unit on 3/19 and 1 unit on 3/24  Likely dilutional and secondary to worsening pancreatitis.   Patient with no overt bleeding.   Anemia panel consistent with anemia of chronic disease/iron deficiency anemia.  FOBT negative.   Per GI upper endoscopy to be done in the outpatient setting.  GI following.  9.  Hypocalcemia/hypoalbuminemia Hypocalcemia corrects with albumin Continue to monitor with TPN   10.  Transaminitis Images done did not show any gallbladder stones or inflammation.  Patient noted to have diffuse fatty liver.   Improving.  CTM.  11.  Thrombocytopenia  Thrombocytosis Likely secondary to alcoholic liver disease.  Lovenox discontinued.  No overt bleeding.  Now with elevated platelets.  12.  Suspected cystitis Per CT imaging.  Patient currently asymptomatic.  Urinalysis unremarkable.   13.  Acute kidney injury Resolved with hydration.  Follow.   14.  Hypoglycemic events 2/2 insulin gtt and NPO status. Now on TPN Follow   15 alcohol abuse No signs of withdrawal.  Alcohol cessation stressed to patient.  Social work consulted.  Follow.  16.  Malnutrition Patient presented with acute pancreatitis.  Has essentially been n.p.o. since admission.  Prealbumin of 7.0.  TPN ordered and started the evening of 01/06/2019.  Patient also placed on IV albumin every 6 hours x1 day.  GI following.  # PICC in place, imaging distal PICC tip flipped back and could be entering azygos vein.  Now better positioned.  # Insomnia: benadryl ordered for insomia prn (ambien/restoril have not helped)  # Hypertension: start amlodipine  # Sinus Tachycardia:  likely 2/2 above, continue to monitor  DVT prophylaxis: SCD Code Status: full  Family Communication: none at bedside Disposition Plan: pending improvement   Consultants:   GI  IR  Procedures:   none  Antimicrobials:  Anti-infectives (From admission, onward)   Start     Dose/Rate Route Frequency Ordered Stop   01/06/19 1800  meropenem (MERREM) 1 g in sodium chloride 0.9 % 100 mL IVPB  Status:  Discontinued     1 g 200 mL/hr over 30 Minutes Intravenous Every 8 hours 01/06/19 1722 01/11/19 1521   01/05/19 1800  levofloxacin (LEVAQUIN) IVPB 750 mg  Status:  Discontinued     750 mg 100 mL/hr over 90 Minutes Intravenous Every 24 hours 01/05/19 0954 01/05/19 1000   01/05/19 1800  levofloxacin (LEVAQUIN) IVPB 500 mg  Status:  Discontinued     500 mg 100 mL/hr over 60 Minutes Intravenous Every 24 hours 01/05/19 1012 01/05/19 1041   01/05/19 1130  levofloxacin (LEVAQUIN) IVPB 750 mg  Status:  Discontinued     750 mg 100 mL/hr over 90 Minutes Intravenous Daily 01/05/19 1041 01/06/19 1715   01/04/19 1800  levofloxacin (LEVAQUIN) IVPB 500 mg  Status:  Discontinued     500 mg 100 mL/hr over 60 Minutes Intravenous Every 24 hours 01/04/19 1612 01/05/19 0954     Subjective: Feeling ok. Having sharp pain to L back and abdomen. Increased morphine.  Objective: Vitals:   01/12/19 1416 01/12/19 1417 01/12/19 1753 01/12/19 1754  BP:   129/82  Pulse:   (!) 118 (!) 116  Resp:      Temp: 98.7 F (37.1 C) 99.2 F (37.3 C) 99.5 F (37.5 C) 99.5 F (37.5 C)  TempSrc: Oral Oral Oral Oral  SpO2:   92%   Weight:      Height:        Intake/Output Summary (Last 24 hours) at 01/12/2019 1938 Last data filed at 01/12/2019 1820 Gross per 24 hour  Intake 1200 ml  Output 1600 ml  Net -400 ml   Filed Weights   01/07/19 0351 01/08/19 0500 01/10/19 2052  Weight: 77.3 kg 77.6 kg 75.5 kg    Examination:  General: No acute distress. Cardiovascular: Heart sounds show Jamiel Goncalves regular rate, and  rhythm. Lungs: Clear to auscultation bilaterally  Abdomen: Soft, TTP in LUQ.  Mildly distended. Neurological: Alert and oriented 3. Moves all extremities 4 . Cranial nerves II through XII grossly intact. Skin: Warm and dry. No rashes or lesions. Extremities: No clubbing or cyanosis. No edema.  Psychiatric: Mood and affect are normal. Insight and judgment are appropriate.     Data Reviewed: I have personally reviewed following labs and imaging studies  CBC: Recent Labs  Lab 01/06/19 0503 01/07/19 0830  01/08/19 0515 01/09/19 0544 01/10/19 0915 01/11/19 0426 01/12/19 0407  WBC 13.5* 13.0*  --  12.7* 14.6* 12.3* 12.7* 13.1*  NEUTROABS 11.1* 11.2*  --  11.2*  --   --   --   --   HGB 7.5* 6.6*   < > 8.2* 8.7* 7.7* 8.4* 9.0*  HCT 24.1* 20.5*   < > 25.5* 26.9* 26.7* 27.5* 28.5*  MCV 105.7* 99.5  --  97.3 98.5 109.0* 101.5* 100.4*  PLT 410* 488*  --  530* 572* 580* 657* 660*   < > = values in this interval not displayed.   Basic Metabolic Panel: Recent Labs  Lab 01/08/19 0515 01/09/19 0544 01/10/19 0336 01/11/19 0426 01/12/19 0407  NA 138 133* 135 134* 133*  K 3.3* 4.3 4.2 4.1 4.1  CL 104 104 106 103 100  CO2 24 22 21* 22 22  GLUCOSE 127* 137* 135* 122* 102*  BUN CREATININE 0.64 0.54* 0.49* 0.59* 0.62  CALCIUM 8.3* 8.0* 8.2* 8.0* 8.2*  MG 2.3 2.1 2.0 2.2 2.1  PHOS 4.2 4.0 3.6 4.3 5.1*   GFR: Estimated Creatinine Clearance: 145.7 mL/min (by C-G formula based on SCr of 0.62 mg/dL). Liver Function Tests: Recent Labs  Lab 01/08/19 0515 01/09/19 0544 01/10/19 0336 01/11/19 0426 01/12/19 0407  AST 60* 68* 104* 157* 97*  ALT 49* 80* 128* 201* 161*  ALKPHOS 81 98 86 112 117  BILITOT 2.0* 1.5* 1.7* 1.7* 1.8*  PROT 5.6* 5.6* 5.7* 5.8* 5.9*  ALBUMIN 2.8* 2.7* 2.5* 2.4* 2.5*   Recent Labs  Lab 01/06/19 0503  LIPASE 47   No results for input(s): AMMONIA in the last 168 hours. Coagulation Profile: Recent Labs  Lab 01/06/19 0503  INR 1.3*    Cardiac Enzymes: No results for input(s): CKTOTAL, CKMB, CKMBINDEX, TROPONINI in the last 168 hours. BNP (last 3 results) No results for input(s): PROBNP in the last 8760 hours. HbA1C: No results for input(s): HGBA1C in the last 72 hours. CBG: Recent Labs  Lab 01/11/19 2141 01/12/19 0553 01/12/19 0617 01/12/19 0835 01/12/19 1412  GLUCAP 130* 49* 150* 124* 132*   Lipid Profile: Recent Labs    01/11/19 0426 01/12/19 0407  TRIG 149 157*   Thyroid Function Tests:  No results for input(s): TSH, T4TOTAL, FREET4, T3FREE, THYROIDAB in the last 72 hours. Anemia Panel: No results for input(s): VITAMINB12, FOLATE, FERRITIN, TIBC, IRON, RETICCTPCT in the last 72 hours. Sepsis Labs: Recent Labs  Lab 01/08/19 0515 01/08/19 0800  LATICACIDVEN 1.3 1.3    Recent Results (from the past 240 hour(s))  Culture, blood (Routine X 2) w Reflex to ID Panel     Status: None   Collection Time: 01/05/19  9:05 AM  Result Value Ref Range Status   Specimen Description   Final    BLOOD RIGHT HAND Performed at [facility], [address], [place], [place] [address]    Special Requests   Final    BOTTLES DRAWN AEROBIC ONLY Blood Culture adequate volume Performed at [facility], [address], [place], [place] [address]    Culture   Final    NO GROWTH 5 DAYS Performed at [facility], [address] [place], [place] [address]    Report Status 01/10/2019 FINAL  Final  Culture, blood (Routine X 2) w Reflex to ID Panel     Status: None   Collection Time: 01/05/19  9:05 AM  Result Value Ref Range Status   Specimen Description   Final    BLOOD LEFT HAND Performed at [facility], [address], [place], [place] [address]    Special Requests   Final    BOTTLES DRAWN AEROBIC ONLY Blood Culture adequate volume Performed at [facility], [address] [place], [place] [address]    Culture   Final    NO GROWTH  5 DAYS Performed at [facility], [address], [place], [place] [address]    Report Status 01/10/2019 FINAL  Final  Culture, Urine     Status: None   Collection Time: 01/05/19 10:55 AM  Result Value Ref Range Status   Specimen Description   Final    URINE, CLEAN CATCH Performed at [facility], [address], [place], [place] [address]    Special Requests   Final    NONE Performed at [facility], [address], [place], [place] [address]    Culture   Final    NO GROWTH Performed at [facility], [address], [place], [place] [address]    Report Status 01/06/2019 FINAL  Final  Culture, body fluid-bottle     Status: None (Preliminary result)   Collection Time: 01/08/19  3:48 PM  Result Value Ref Range Status   Specimen Description PLEURAL  Final   Special Requests NONE  Final   Culture   Final    NO GROWTH 4 DAYS Performed at [facility], [address] [place], [place] [address]    Report Status PENDING  Incomplete  Gram stain     Status: None   Collection Time: 01/08/19  3:48 PM  Result Value Ref Range Status   Specimen Description PLEURAL  Final   Special Requests NONE  Final   Gram Stain   Final    MODERATE WBC PRESENT, PREDOMINANTLY PMN NO ORGANISMS SEEN Performed at [facility], [address], [place], [place] [address]    Report Status 01/08/2019 FINAL  Final         Radiology Studies: Dg Chest 2 View  Result Date: 01/11/2019 CLINICAL DATA:  Shortness of breath and chest pain. Re-evaluate pleural effusion. EXAM: CHEST - 2 VIEW COMPARISON:  01/08/2019 FINDINGS: Left PICC line tip at low SVC.  Nasogastric tip at gastric fundus. Midline trachea. Normal heart size. Slight increase in small left pleural effusion. No pneumothorax. Minimal right infrahilar atelectasis. Patchy left base airspace disease. IMPRESSION: Slight increase in small left pleural effusion with persistent adjacent left  lower lobe atelectasis. Electronically Signed   By: Jeronimo Greaves M.D.   On: 01/11/2019 10:51        Scheduled Meds: . amLODipine  5 mg Oral Daily  . Chlorhexidine Gluconate Cloth  6 each Topical Daily  . Chlorhexidine Gluconate Cloth  6 each Topical Daily  . feeding supplement  1 Container Oral TID BM  . insulin aspart  0-9 Units Subcutaneous Q8H  . metoCLOPramide (REGLAN) injection  10 mg Intravenous Q8H  . nicotine  14 mg Transdermal Daily  . simethicone  160 mg Oral QID  . sodium chloride flush  10-40 mL Intracatheter Q12H   Continuous Infusions: . sodium chloride 1,000 mL (01/08/19 1645)  . TPN ADULT (ION) 60 mL/hr at 01/12/19 1734     LOS: 15 days    Time spent: over 30 min    Lacretia Nicks, MD [facility] Pager AMION  If 7PM-7AM, please contact night-coverage [email or web address] Password [facility] 01/12/2019, 7:38 PM

## 2019-01-12 NOTE — Progress Notes (Signed)
Pt's CBG this AM is 49. Asymptomatic. NT gave pt 2 apple juices. Will recheck CBG in .

## 2019-01-13 LAB — COMPREHENSIVE METABOLIC PANEL
ALT: 148 U/L — ABNORMAL HIGH (ref 0–44)
AST: 85 U/L — ABNORMAL HIGH (ref 15–41)
Albumin: 2.5 g/dL — ABNORMAL LOW (ref 3.5–5.0)
Alkaline Phosphatase: 126 U/L (ref 38–126)
Anion gap: 9 (ref 5–15)
BUN: 18 mg/dL (ref 6–20)
CO2: 23 mmol/L (ref 22–32)
Calcium: 8.4 mg/dL — ABNORMAL LOW (ref 8.9–10.3)
Chloride: 101 mmol/L (ref 98–111)
Creatinine, Ser: 0.64 mg/dL (ref 0.61–1.24)
GFR calc Af Amer: >60 mL/min (ref >60)
GFR calc non Af Amer: >60 mL/min (ref >60)
GLUCOSE: 105 mg/dL — AB (ref 70–99)
Potassium: 4.3 mmol/L (ref 3.5–5.1)
Sodium: 133 mmol/L — ABNORMAL LOW (ref 135–145)
TOTAL PROTEIN: 6.1 g/dL — AB (ref 6.5–8.1)
Total Bilirubin: 2.2 mg/dL — ABNORMAL HIGH (ref 0.3–1.2)

## 2019-01-13 LAB — DIFFERENTIAL
Abs Immature Granulocytes: 0.19 10*3/uL — ABNORMAL HIGH (ref 0.00–0.07)
Basophils Absolute: 0.1 10*3/uL (ref 0.0–0.1)
Basophils Relative: 1 %
Eosinophils Absolute: 0.3 10*3/uL (ref 0.0–0.5)
Eosinophils Relative: 3 %
Immature Granulocytes: 2 %
LYMPHS PCT: 8 %
Lymphs Abs: 1 10*3/uL (ref 0.7–4.0)
Monocytes Absolute: 1.2 10*3/uL — ABNORMAL HIGH (ref 0.1–1.0)
Monocytes Relative: 9 %
Neutro Abs: 9.9 10*3/uL — ABNORMAL HIGH (ref 1.7–7.7)
Neutrophils Relative %: 77 %

## 2019-01-13 LAB — MAGNESIUM: Magnesium: 2.3 mg/dL (ref 1.7–2.4)

## 2019-01-13 LAB — CBC
HEMATOCRIT: 28.6 % — AB (ref 39.0–52.0)
Hemoglobin: 8.7 g/dL — ABNORMAL LOW (ref 13.0–17.0)
MCH: 30.1 pg (ref 26.0–34.0)
MCHC: 30.4 g/dL (ref 30.0–36.0)
MCV: 99 fL (ref 80.0–100.0)
Platelets: 611 10*3/uL — ABNORMAL HIGH (ref 150–400)
RBC: 2.89 MIL/uL — ABNORMAL LOW (ref 4.22–5.81)
RDW: 13.2 % (ref 11.5–15.5)
WBC: 12.7 10*3/uL — ABNORMAL HIGH (ref 4.0–10.5)
nRBC: 0 % (ref 0.0–0.2)

## 2019-01-13 LAB — TRIGLYCERIDES
TRIGLYCERIDES: 157 mg/dL — AB (ref <150)
Triglycerides: 149 mg/dL (ref <150)

## 2019-01-13 LAB — CULTURE, BODY FLUID W GRAM STAIN -BOTTLE: Culture: NO GROWTH

## 2019-01-13 LAB — GLUCOSE, CAPILLARY
GLUCOSE-CAPILLARY: 115 mg/dL — AB (ref 70–99)
Glucose-Capillary: 108 mg/dL — ABNORMAL HIGH (ref 70–99)
Glucose-Capillary: 118 mg/dL — ABNORMAL HIGH (ref 70–99)

## 2019-01-13 LAB — PHOSPHORUS: Phosphorus: 4.8 mg/dL — ABNORMAL HIGH (ref 2.5–4.6)

## 2019-01-13 LAB — PREALBUMIN: PREALBUMIN: 10.7 mg/dL — AB (ref 18–38)

## 2019-01-13 LAB — BILIRUBIN, DIRECT: Bilirubin, Direct: 1.2 mg/dL — ABNORMAL HIGH (ref 0.0–0.2)

## 2019-01-13 MED ORDER — POLYETHYLENE GLYCOL 3350 17 G PO PACK: 17.0000 g | PACK | Freq: Two times a day (BID) | ORAL | Status: DC

## 2019-01-13 MED ORDER — METOCLOPRAMIDE HCL 5 MG/ML IJ SOLN: 10.0000 mg | Freq: Three times a day (TID) | INTRAMUSCULAR | Status: DC | PRN

## 2019-01-13 MED ORDER — POLYETHYLENE GLYCOL 3350 17 G PO PACK
17.0000 g | PACK | Freq: Every day | ORAL | Status: DC
  Administered 2019-01-13 – 2019-01-14 (×2): 17 g via ORAL
  Filled 2019-01-13 (×2): qty 1

## 2019-01-13 MED ORDER — TRACE MINERALS CR-CU-MN-SE-ZN 10-1000-500-60 MCG/ML IV SOLN
INTRAVENOUS | Status: AC
  Administered 2019-01-13: 18:00:00 via INTRAVENOUS
  Filled 2019-01-13: qty 720

## 2019-01-13 NOTE — Progress Notes (Signed)
[facility] Progress Note  CC:  EtOH + Hypertriglyceridemia pancreatitis, ileus   Subjective: He reports sleeping well last night. No nausea or vomiting. Tolerating clear liquids. Generalized abdominal pain is less, lower back pain is less. Passing a moderate amount of gas per the rectum. Passed 2 soft to loose brown stools yesterday. No rectal bleeding.   Objective:   NGT: drained 150cc bilious drainage when NGT placed to suction, NGT re-clamped. Vital signs in last 24 hours: Temp:  [97.3 F (36.3 C)-102.3 F (39.1 C)] 97.3 F (36.3 C) (03/30 0553) Pulse Rate:  [116-123] 121 (03/30 0553) Resp:  [17-18] 18 (03/30 0553) BP: (129-133)/(80-87) 131/87 (03/30 0553) SpO2:  [92 %-94 %] 94 % (03/30 0553) Last BM Date: 01/12/19 General: Alert and oriented in NAD Heart:  Regular rate and rhythm; no murmurs Pulm: Clear, decreased LLL.  Abdomen: Mild distension, soft, nontender, hypoactive BS x 4 quads, tympanic to percussion. Extremities:  Without edema. Neurologic:  Alert and  oriented x4;  grossly normal neurologically. Psych:  Alert and cooperative. Normal mood and affect.  Intake/Output from previous day: 03/29 0701 - 03/30 0700 In: 1200 [P.O.:480; I.V.:720] Out: 1250 [Urine:1250] Intake/Output this shift: No intake/output data recorded.  Lab Results: Recent Labs    01/11/19 0426 01/12/19 0407 01/13/19 0314  WBC 12.7* 13.1* 12.7*  HGB 8.4* 9.0* 8.7*  HCT 27.5* 28.5* 28.6*  PLT 657* 660* 611*   BMET Recent Labs    01/11/19 0426 01/12/19 0407 01/13/19 0314  NA 134* 133* 133*  K 4.1 4.1 4.3  CL 103 100 101  CO2 22 22 23   GLUCOSE 122* 102* 105*  BUN 13 16 18   CREATININE 0.59* 0.62 0.64  CALCIUM 8.0* 8.2* 8.4*   LFT Recent Labs    01/12/19 0407 01/13/19 0314  PROT 5.9* 6.1*  ALBUMIN 2.5* 2.5*  AST 97* 85*  ALT 161* 148*  ALKPHOS 117 126  BILITOT 1.8* 2.2*  BILIDIR 1.1* 1.2*  IBILI 0.7  --    Dg Chest 2 View  Result Date: 01/11/2019  CLINICAL DATA:  Shortness of breath and chest pain. Re-evaluate pleural effusion. EXAM: CHEST - 2 VIEW COMPARISON:  01/08/2019 FINDINGS: Left PICC line tip at low SVC.  Nasogastric tip at gastric fundus. Midline trachea. Normal heart size. Slight increase in small left pleural effusion. No pneumothorax. Minimal right infrahilar atelectasis. Patchy left base airspace disease. IMPRESSION: Slight increase in small left pleural effusion with persistent adjacent left lower lobe atelectasis. Electronically Signed   By: Jeronimo Greaves M.D.   On: 01/11/2019 10:51    Assessment / Plan:  1. 28 y.o. male with EtOH + Hypertriglyceridemia Pancreatitis. CT scan 01/10/19 showed stable severe pancreatitis with extensive pseudocyst formation, large left pleural effusion, and biliary sludge in the gallbladder. No pancreatic ductal dilation noted. TGs 149. He looks well today. Increased energy. Ambulating up in the hall. -continue TPN -daily electrolytes and TGs   2. Generalized abdominal pain secondary to # 1. Abdominal pain is well controlled on Morphine 4mg IV Q 4hrs. No longer on Toradol. Pain management with caution d/t hx polyp drug abuse. -continue PPI IV BID  -pain management per hospitalist  3.Small bowelIleussecondary to pancreatitis. Repeat Abd/Pelvic CT 3/22 showed gaseous distention of the proximal jejunum, distended to approximately 5 cm diametersecondary to ileus.NGT briefly placed to suction this am drained 150cc then re-clamped. -will dc NGT 01/14/2019 if no significant drainage in am, if no N/V or increase in abdominal distension. -monitor abdominal distension, BMs -  ambulate in hall qid as tolerated   4. Nausea/Vomiting. No current Nausea or vomiting. Last received Zofran 3/25. -Zofran 4mg  po or IV Q 6hrs PRN -if Nausea or vomiting recurs place NGT to LIS  5. Anemia, dilutional, + rectal bleeding.Iron32. Received Feraheme IV 3/23.Received 1 unit of PRBCs on 3/19 and 3/24. Hg 8.7 << 9.0.  No further rectal bleeding. -Transfuse for Hg < 7 -Continue to monitor rectal bleeding -No plans for EGD/colonoscopy at this time  6. Leukocytosisd/t pleural effusion vs pancreatitis. Temp 102.3 on 3/29, Temp 3/30 97.3. Left pleural effusion/pneumonia improving. [facility] 3/22 no growth.  S/P Thorocentesis 3/25 cx no growth.  Defer pancreatic fluid drain/cx for now.   -Continue to monitor Temp and WBC -Repeat BC if fever or increase in WBC occurs  6. Elevated LFTs secondary to pancreatitis. CT showed gallbladder sludge, no gallstones. AST 85 << 97. ALT 148 << 161. T. Bili 2.2 >> 1.8.  3/18 Acute hepatitis panel negative. -repeat hepatic panel 3/27  7.Malnutrition. Albumin 2.5. -continue TPN -clear liquid diet as tolerated  8. Etoh abuse -no evidence of withddrawal -no alcohol discussed         LOS: 16 days   Arnaldo Natal  01/13/2019, 9:05 AM

## 2019-01-13 NOTE — Progress Notes (Signed)
HR=130. Patient has history of ST. Patient is alert and oriented x4. Pain is complained on upper right abdomen. Pain medication was given as MD ordered. Will monitor patient closely and recheck vital signs later.

## 2019-01-13 NOTE — Progress Notes (Signed)
**Note De-Identified vi Obfusction** PROGRESS NOTE    Charles Dougherty  ZOX:[id number] DOB: 03/28/1991 DOA: 12/27/2018 PCP: [name], No Pcp Per   Brief Nrrtive:  [name] is  42 yer old mle with history of lcohol buse, substnce buse, smoker who presents to the emergencydeprtment with complints of bdominl pin. Admitted for the mngement of cute pncretitis relted to lcohol.Incidentlly found to hvesevere hypertriglyceridemi.He hs positive fmily history.Hospitl course remrkble for bdominl discomfort, distention .Abdominl x-ry suggested erly bowel obstruction. Generl surgery consulted nd he ws strted on conservtive mngement with NG tube decompression, n.p.o. sttus. Uncler if the pncretitis ws precipitted by lcohol or hypertriglyceridemi.Due to severe hypertriglyceridemi,westrted on insulin drip nd moved to stepdown. GI lso consulted.  Assessment & Pln:   Principl Problem:   Acute necrotizing pncretitis Active Problems:   Acute lcoholic pncretitis   Pncretitis   ETOH buse   Drug buse (HCC)   Cystitis   Ileus (HCC)   Hypertriglyceridemi   Abdominl pin   SBO (smll bowel obstruction) (HCC)   Hypophosphtemi   Hypomgnesemi   Hypoclcemi   AKI (cute kidney injury) (HCC)   Anemi   Fever   Pneumoni of left lower lobe due to infectious orgnism (HCC)   Abnorml liver enzymes   1 Acute necrotizing pncretitis secondry to lcohol nd hypertriglyceridemi  Ileus Presented 3/13 with elevted lipse nd triglycerides >5000 Dily drinker CT from 3/14 with cute edemtous interstitil pncretitis 3/22 CT with evidence of necrotizing pncretitis.  New free fluid nd phlegmonous/inflmmtory mteril surrounding the pncrese extending into bilterl prcolic gutters nd pelvis (see report) PT hving BM's.   Pt on clers yesterdy, but c/o incresed bdominl distension/bloting on 3/26 PM.  Abdominl x ry repeted, suggestive of ileus.   Clmp  NGT.  Continue clers. Repet CT scn from 3/28 with severe pncretitis with extensive pseudocyst formtion throught the retroperitoneum nd upper bdomen.  Biliry sludge in the gllbldder with definite findings to suggest n cute cholecystitis t this time.  Lrge L nd smll R pleurl effusions. TPN for nutrition GI following, pprecite recs  D/c meropenem.  Morphine prn pin (incresed dose due to bck pin).  D/c tordol now (s/p 5 dys).  #.  Bilterl Pleurl Effusions (L>R)  Left lower lobe telectsis/consolidtion Noted on CXR from 3/25 Lst fever 3/25 PM Blood nd urine cx NGTD Leukocytosis present, but improving Negtive urine strep nd legionell Currently on meropenem for possible pneumoni nd necrotizing pncretitis S/p thorcentesis by IR on 3/26, with 640 cc hzy drk mber/te colored fluid Exudtive by light's.  Follow pending culture (NGTDx4), pH 7.5, nd cytology (rective ppering mesothelil cells).  Follow dd on mylse (24).  Possibly 2/2 cute pncretitis? Repet CXR 3/28 (notble for smll L pleurl effusion with persistent LLL telectsis) given effusions on CT 3/27.   Will hold off on repet thor for now, consider repet CXR in  few dys.   2.  Severe hypertriglyceridemi [name] with fmily history of hypertriglyceridemi. [name] ws plced on the insulin drip with significnt improvement with elevted triglyceride levels.   Repet tomorrow Will need fibrte on d/c  3.  Smll bowel obstruction versus ileus secondry to pncretitis [name] with 1.1 L output from NG tube over the pst 24 hours.   Abdominl films 3/24 with ileus vs SBO, suspect ileus more likely s pt hving BM's Continue simethicone 160 mg p.o. 4 times dily.   [name] strted on IV Regln per gstroenterology.  Will chnge to prn.  CT bdomen/pelvis s noted bove Clmp NG nd tril clers  6.  Fever **Note De-Identified vi Obfusction** Likely 2/2 bove.  Currently on bx for possible pneumoni nd necrotizing  pncretitis.   S/p thor s noted bove Blood nd urine cx ngtd. My need repet imging (plnning for discussion with GI tody). Lst fever 3/29 Continue to monitor off bx, if recurrent, will need to reculture nd consider restrting bx, but he remins hemodynmiclly stble.  7.  Hypophosphtemi/hypoklemi Replce nd follow.   8.  Anemi S/p 1 unit on 3/19 nd 1 unit on 3/24  Likely dilutionl nd secondry to worsening pncretitis.   [name] with no overt bleeding.   Anemi pnel consistent with nemi of chronic disese/iron deficiency nemi.  FOBT negtive.   Per GI upper endoscopy to be done in the outptient setting.  GI following.  9.  Hypoclcemi/hypolbuminemi Hypoclcemi corrects with lbumin Continue to monitor with TPN   10.  Trnsminitis  Elevted Bilirubin Imges done did not show ny gllbldder stones or inflmmtion.  [name] noted to hve diffuse ftty liver.   AST nd ALT improving Bili worsened  11.  Thrombocytopeni  Thrombocytosis Likely secondry to lcoholic liver disese.  Lovenox discontinued.  No overt bleeding.  Now with elevted pltelets.  12.  Suspected cystitis Per CT imging.  [name] currently symptomtic.  Urinlysis unremrkble.   13.  Acute kidney injury Resolved with hydrtion.  Follow.   14.  Hypoglycemic events 2/2 insulin gtt nd NPO sttus. Now on TPN Follow   15 lcohol buse No signs of withdrwl.  Alcohol cesstion stressed to ptient.  Socil work consulted.  Follow.  16.  Mlnutrition [name] presented with cute pncretitis.  Hs essentilly been n.p.o. since dmission.  Prelbumin of 7.0.  TPN ordered nd strted the evening of 01/06/2019.  [name] lso plced on IV lbumin every 6 hours x1 dy.  GI following.  # PICC in plce, imging distl PICC tip flipped bck nd could be entering zygos vein.  Now better positioned.  # Insomni: bendryl ordered for insomi prn (mbien/restoril hve not  helped)  # Hypertension: strt mlodipine  # Sinus Tchycrdi: likely 2/2 bove, continue to monitor  DVT prophylxis: SCD Code Sttus: full  Fmily Communiction: none t bedside Disposition Pln: pending improvement   Consultnts:   GI  IR  Procedures:   none  Antimicrobils:  Anti-infectives (From dmission, onwrd)   Strt     Dose/Rte Route Frequency Ordered Stop   01/06/19 1800  meropenem (MERREM) 1 g in sodium chloride 0.9 % 100 mL IVPB  Sttus:  Discontinued     1 g 200 mL/hr over 30 Minutes Intrvenous Every 8 hours 01/06/19 1722 01/11/19 1521   01/05/19 1800  levofloxcin (LEVAQUIN) IVPB 750 mg  Sttus:  Discontinued     750 mg 100 mL/hr over 90 Minutes Intrvenous Every 24 hours 01/05/19 0954 01/05/19 1000   01/05/19 1800  levofloxcin (LEVAQUIN) IVPB 500 mg  Sttus:  Discontinued     500 mg 100 mL/hr over 60 Minutes Intrvenous Every 24 hours 01/05/19 1012 01/05/19 1041   01/05/19 1130  levofloxcin (LEVAQUIN) IVPB 750 mg  Sttus:  Discontinued     750 mg 100 mL/hr over 90 Minutes Intrvenous Dily 01/05/19 1041 01/06/19 1715   01/04/19 1800  levofloxcin (LEVAQUIN) IVPB 500 mg  Sttus:  Discontinued     500 mg 100 mL/hr over 60 Minutes Intrvenous Every 24 hours 01/04/19 1612 01/05/19 0954     Subjective: Pin  bit better since yesterdy.  Objective: Vitls:   01/12/19 1753 01/12/19 1754 01/12/19 2101 01/13/19 0553  BP: **Note De-Identified vi Obfusction** 129/82  133/87 131/87  Pulse: (!) 118 (!) 116 (!) 123 (!) 121  Resp:   17 18  Temp: 99.5 F (37.5 C) 99.5 F (37.5 C) 99.7 F (37.6 C) (!) 97.3 F (36.3 C)  TempSrc: Orl Orl Orl Orl  SpO2: 92%  94% 94%  Weight:      Height:        Intke/Output Summry (Lst 24 hours) t 01/13/2019 1212 Lst dt filed t 01/13/2019 1015 Gross per 24 hour  Intke 1330 ml  Output 1400 ml  Net -70 ml   Filed Weights   01/07/19 0351 01/08/19 0500 01/10/19 2052  Weight: 77.3 kg 77.6 kg 75.5 kg    Exmintion:  Generl: No cute  distress. Crdiovsculr: Hert sounds show  tchycrdic rte Lungs: Cler to usculttion bilterlly Abdomen: Soft, mildly tender nd distended Neurologicl: Alert nd oriented 3. Moves ll extremities 4. Crnil nerves II through XII grossly intct. Skin: Wrm nd dry. No rshes or lesions. Extremities: No clubbing or cynosis. No edem.  Psychitric: Mood nd ffect re norml. Insight nd judgment re pproprite.    Dt Reviewed: I hve personlly reviewed following lbs nd imging studies  CBC: Recent Lbs  Lb 01/07/19 0830  01/08/19 0515 01/09/19 0544 01/10/19 0915 01/11/19 0426 01/12/19 0407 01/13/19 0314  WBC 13.0*  --  12.7* 14.6* 12.3* 12.7* 13.1* 12.7*  NEUTROABS 11.2*  --  11.2*  --   --   --   --  9.9*  HGB 6.6*   < > 8.2* 8.7* 7.7* 8.4* 9.0* 8.7*  HCT 20.5*   < > 25.5* 26.9* 26.7* 27.5* 28.5* 28.6*  MCV 99.5  --  97.3 98.5 109.0* 101.5* 100.4* 99.0  PLT 488*  --  530* 572* 580* 657* 660* 611*   < > = vlues in this intervl not displyed.   Bsic Metbolic Pnel: Recent Lbs  Lb 01/09/19 0544 01/10/19 0336 01/11/19 0426 01/12/19 0407 01/13/19 0314  NA 133* 135 134* 133* 133*  K 4.3 4.2 4.1 4.1 4.3  CL 104 106 103 100 101  CO2 22 21* GLUCOSE 137* 135* 122* 102* 105*  BUN CREATININE 0.54* 0.49* 0.59* 0.62 0.64  CALCIUM 8.0* 8.2* 8.0* 8.2* 8.4*  MG 2.1 2.0 2.2 2.1 2.3  PHOS 4.0 3.6 4.3 5.1* 4.8*   GFR: Estimted Cretinine Clernce: 145.7 mL/min (by [employer] formul bsed on SCr of 0.64 mg/dL). Liver Function Tests: Recent Lbs  Lb 01/09/19 0544 01/10/19 0336 01/11/19 0426 01/12/19 0407 01/13/19 0314  AST 68* 104* 157* 97* 85*  ALT 80* 128* 201* 161* 148*  ALKPHOS 98 86 112 117 126  BILITOT 1.5* 1.7* 1.7* 1.8* 2.2*  PROT 5.6* 5.7* 5.8* 5.9* 6.1*  ALBUMIN 2.7* 2.5* 2.4* 2.5* 2.5*   No results for input(s): LIPASE, AMYLASE in the lst 168 hours. No results for input(s): AMMONIA in the lst 168 hours.  Cogultion Profile: No results for input(s): INR, PROTIME in the lst 168 hours. Crdic Enzymes: No results for input(s): CKTOTAL, CKMB, CKMBINDEX, TROPONINI in the lst 168 hours. BNP (lst 3 results) No results for input(s): PROBNP in the lst 8760 hours. HbA1C: No results for input(s): HGBA1C in the lst 72 hours. CBG: Recent Lbs  Lb 01/12/19 0617 01/12/19 0835 01/12/19 1412 01/12/19 2105 01/13/19 0553  GLUCAP 150* 124* 132* 102* 108*   Lipid Profile: Recent Lbs    01/12/19 0407 01/13/19 0314  TRIG 157* 149  157*  Thyroid Function Tests: No results for input(s): TSH, T4TOTAL, FREET4, T3FREE, THYROIDAB in the last 72 hours. Anemia Panel: No results for input(s): VITAMINB12, FOLATE, FERRITIN, TIBC, IRON, RETICCTPCT in the last 72 hours. Sepsis Labs: Recent Labs  Lab 01/08/19 0515 01/08/19 0800  LATICACIDVEN 1.3 1.3    Recent Results (from the past 240 hour(s))  Culture, blood (Routine X 2) w Reflex to ID Panel     Status: None   Collection Time: 01/05/19  9:05 AM  Result Value Ref Range Status   Specimen Description   Final    BLOOD RIGHT HAND Performed at [facility], [address] [place], [place] [address]    Special Requests   Final    BOTTLES DRAWN AEROBIC ONLY Blood Culture adequate volume Performed at [facility], [address] [place], [place] [address]    Culture   Final    NO GROWTH 5 DAYS Performed at [facility], [address] [place], [place] [address]    Report Status 01/10/2019 FINAL  Final  Culture, blood (Routine X 2) w Reflex to ID Panel     Status: None   Collection Time: 01/05/19  9:05 AM  Result Value Ref Range Status   Specimen Description   Final    BLOOD LEFT HAND Performed at [facility], [address], [place], [place] [address]    Special Requests   Final    BOTTLES DRAWN AEROBIC ONLY Blood Culture adequate volume Performed at [facility], [address], [place], [place] [address]    Culture   Final    NO GROWTH 5 DAYS Performed at [facility], [address] [place], [place] [address]    Report Status 01/10/2019 FINAL  Final  Culture, Urine     Status: None   Collection Time: 01/05/19 10:55 AM  Result Value Ref Range Status   Specimen Description   Final    URINE, CLEAN CATCH Performed at [facility], [address] [place], [place] [address]    Special Requests   Final    NONE Performed at [facility], [address], [place], [place] [address]    Culture   Final    NO GROWTH Performed at [facility], [address], [place], [place] [address]    Report Status 01/06/2019 FINAL  Final  Culture, body fluid-bottle     Status: None (Preliminary result)   Collection Time: 01/08/19  3:48 PM  Result Value Ref Range Status   Specimen Description PLEURAL  Final   Special Requests NONE  Final   Culture   Final    NO GROWTH 4 DAYS Performed at [facility], [address], [place], [place] [address]    Report Status PENDING  Incomplete  Gram stain     Status: None   Collection Time: 01/08/19  3:48 PM  Result Value Ref Range Status   Specimen Description PLEURAL  Final   Special Requests NONE  Final   Gram Stain   Final    MODERATE WBC PRESENT, PREDOMINANTLY PMN NO ORGANISMS SEEN Performed at [facility], [address] [place], [place] [address]    Report Status 01/08/2019 FINAL  Final         Radiology Studies: No results found.      Scheduled Meds: . amLODipine  5 mg Oral Daily  . Chlorhexidine Gluconate Cloth  6 each Topical Daily  . Chlorhexidine Gluconate Cloth  6 each Topical Daily  . feeding supplement  1 Container Oral TID BM  . insulin aspart  0-9 Units Subcutaneous Q8H  . metoCLOPramide (REGLAN) injection  10 mg Intravenous Q8H  . nicotine  14 mg Transdermal Daily  . simethicone  160 mg Oral QID   . sodium chloride flush  10-40 mL Intracatheter Q12H   Continuous Infusions: . sodium chloride 1,000 mL (01/08/19 1645)  . TPN ADULT (ION) 60 mL/hr at 01/12/19 1734  . TPN ADULT (ION)       LOS: 16 days    Time spent: over 30 min    Lacretia Nicksaldwell Powell, MD [facility] Pager AMION  If 7PM-7AM, please contact night-coverage [email or web address] Password [email or web address] 01/13/2019, 12:12 PM

## 2019-01-13 NOTE — Progress Notes (Signed)
PHARMACY - ADULT TOTAL PARENTERAL NUTRITION CONSULT NOTE   Pharmacy Consult for TPN Indication: Severe Pancreatitis  Patient Measurements: Height: 5\' 10"  (177.8 cm) Weight: 166 lb 7.2 oz (75.5 kg) IBW/kg (Calculated) : 73 TPN AdjBW (KG): 78 Body mass index is 23.88 kg/m. Usual Weight: 70.4 kg  HPI: 86 yoM admitted on 3/14 with pancreatitis (precipitated by hypertriglyceridemia).Hypertriglyceridemia was treated with insulin drip.Abdominal x-ray suggested early bowel obstruction.NGT remains on suction. Unable to tolerate clears.  Central access: PICC 3/23 TPN start date: 3/23  Significant events:   3/21 Reglan added  3/22:TPN ordered but unable to start today d/t ordering cut off time of 12:00,PICC line was ordered, butnot placed, concern with leukocytosis  3/23 Albumin x 4 doses; TPN started without lipids  3/25 bilateral pleural effusions s/p L thoracentesis, out. PICC with possible malpositioning - held use of PICC line temporarily.  TPN not documented as held.  PICC positioning corrected on imaging at 15:55 post thoracentesis.  3/26 NGT removed, advancing diet.  3/27: spoke to [facility] from GI team, ok to add lipid to TPN  3/28 Travasol on short supply; instructed to use Clinisol instead  ASSESSMENT                                                                                                           Today, 01/13/2019:  Glucose (CBG goal 100-150) - well controlled, at goal on sensitive SSI  None yesterday  Electrolytes - Na remains borderline low, Phos now high, all others stable at goal  Renal - SCr, BUN:SCr stable WNL, bicarb improved; UOP adequate  LFTs - improved today after a second mild spike this admission;  Tbili remains elevated  TGs - borderline high today (157); admitted with TG > 5000; note lipids resumed to TPN 3 days ago  Prealbumin - low, decreased since admission  Current Nutrition - CLD   IVF - none  NUTRITIONAL  GOALS                                                                                             RD recs (3/26): Kcal:2100-2300 Protein:100-110g Fluid:2.1L/day  PLAN  At 1800 today:  Continue TPN at 60 ml/hr. Had modified TPN on 3/28 to reduce dextrose (and lipids somewhat) in response to rising LFTs. Not clear if TPN is related to transaminitis, although LFTs did begin to rise with initiation of TPN. If LFTs continue to improve, will cautiously titrate TPN back to full rate (unless able to transition to PO intake)  New 3-in-1 TPN contains 108 g protein, 216 g dextrose, 43 g lipid, providing 1598 kcal (100% protein goals and 76% of kcal goals)  Add MVI, trace elements and famotidine to TPN (replacing pantoprazole d/t concern for hepatotoxicity)  Electrolytes: Increase Na, cont no phos; continue others unchanged; Cl:Ac 1:1  IVF per MD (none currently)  Continue sensitive SSI with CBG checks q8 hr as long as patient well controlled  BMet with mag and phos in the am  TPN lab panels on Mondays & Thursdays  Arley Phenix RPh 01/13/2019, 8:44 AM Pager [phone]

## 2019-01-13 NOTE — Progress Notes (Signed)
Physical Therapy Treatment Patient Details Name: Charles Dougherty MRN: [id number] DOB: 1991-01-19 Today's Date: 01/13/2019    History of Present Illness 28 yo male admitted with acute necrotizing pancreatitis. Hx of ETOH abuse, drug abuse    PT Comments    Pt has met PT goals, he ambulated 800' without an assistive device, no loss of balance. HR 133 at rest, 155 with walking, pt asymptomatic while walking. Pt reports tachycardia is baseline, RN aware. PT signing off as pt is independent with mobility, encouraged pt to ambulate in halls independently at least TID.    Follow Up Recommendations  No PT follow up     Equipment Recommendations  None recommended by PT    Recommendations for Other Services       Precautions / Restrictions Precautions Precautions: Fall Restrictions Weight Bearing Restrictions: No    Mobility  Bed Mobility Overal bed mobility: Independent                Transfers Overall transfer level: Independent                  Ambulation/Gait Ambulation/Gait assistance: Supervision Gait Distance (Feet): 800 Feet Assistive device: None Gait Pattern/deviations: WFL(Within Functional Limits) Gait velocity: WFL   General Gait Details: HR 133 at rest, 155 walking, pt reports this is baseline, RN aware and stated pt may walk if asymptomatic, pt denied dizziness/SOB, no loss of balance   Stairs             Wheelchair Mobility    Modified Rankin (Stroke Patients Only)       Balance Overall balance assessment: Independent                                          Cognition Arousal/Alertness: Awake/alert Behavior During Therapy: WFL for tasks assessed/performed Overall Cognitive Status: Within Functional Limits for tasks assessed                                        Exercises      General Comments        Pertinent Vitals/Pain Pain Score: 3  Pain Location: abdomen Pain Descriptors /  Indicators: Aching Pain Intervention(s): Limited activity within patient's tolerance;Monitored during session;Premedicated before session    Home Living                      Prior Function            PT Goals (current goals can now be found in the care plan section) Acute Rehab PT Goals 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, wood working PT Goal Formulation: With patient Time For Goal Achievement: 01/23/19 Potential to Achieve Goals: Good Progress towards PT goals: Goals met/education completed, patient discharged from PT    Frequency    Min 3X/week      PT Plan Current plan remains appropriate    Co-evaluation              AM-PAC PT "6 Clicks" Mobility   Outcome Measure  Help needed turning from your back to your side while in a flat bed without using bedrails?: None Help needed moving from lying on your back to sitting on the side of a flat bed without using bedrails?: None Help needed moving to and from  a bed to a chair (including a wheelchair)?: None Help needed standing up from a chair using your arms (e.g., wheelchair or bedside chair)?: None Help needed to walk in hospital room?: None Help needed climbing 3-5 steps with a railing? : None 6 Click Score: 24    End of Session Equipment Utilized During Treatment: Gait belt Activity Tolerance: Patient tolerated treatment well Patient left: with call bell/phone within reach;in chair;with nursing/sitter in room Nurse Communication: Mobility status PT Visit Diagnosis: Unsteadiness on feet (R26.81)     Time: 8478-4128 PT Time Calculation (min) (ACUTE ONLY): 16 min  Charges:  $Gait Training: 8-22 mins                     Blondell Reveal Kistler PT 01/13/2019  Acute Rehabilitation Services Pager [phone] Office [phone]

## 2019-01-14 LAB — CBC
HCT: 26.9 % — ABNORMAL LOW (ref 39.0–52.0)
Hemoglobin: 8.4 g/dL — ABNORMAL LOW (ref 13.0–17.0)
MCH: 30.3 pg (ref 26.0–34.0)
MCHC: 31.2 g/dL (ref 30.0–36.0)
MCV: 97.1 fL (ref 80.0–100.0)
Platelets: 526 10*3/uL — ABNORMAL HIGH (ref 150–400)
RBC: 2.77 MIL/uL — ABNORMAL LOW (ref 4.22–5.81)
RDW: 13.4 % (ref 11.5–15.5)
WBC: 11.8 10*3/uL — ABNORMAL HIGH (ref 4.0–10.5)
nRBC: 0 % (ref 0.0–0.2)

## 2019-01-14 LAB — GLUCOSE, CAPILLARY
Glucose-Capillary: 107 mg/dL — ABNORMAL HIGH (ref 70–99)
Glucose-Capillary: 111 mg/dL — ABNORMAL HIGH (ref 70–99)
Glucose-Capillary: 111 mg/dL — ABNORMAL HIGH (ref 70–99)
Glucose-Capillary: 115 mg/dL — ABNORMAL HIGH (ref 70–99)
Glucose-Capillary: 118 mg/dL — ABNORMAL HIGH (ref 70–99)

## 2019-01-14 LAB — HEPATIC FUNCTION PANEL
ALT: 118 U/L — AB (ref 0–44)
AST: 66 U/L — ABNORMAL HIGH (ref 15–41)
Albumin: 2.4 g/dL — ABNORMAL LOW (ref 3.5–5.0)
Alkaline Phosphatase: 146 U/L — ABNORMAL HIGH (ref 38–126)
BILIRUBIN DIRECT: 1.2 mg/dL — AB (ref 0.0–0.2)
Indirect Bilirubin: 0.4 mg/dL (ref 0.3–0.9)
Total Bilirubin: 1.6 mg/dL — ABNORMAL HIGH (ref 0.3–1.2)
Total Protein: 5.8 g/dL — ABNORMAL LOW (ref 6.5–8.1)

## 2019-01-14 LAB — BASIC METABOLIC PANEL
Anion gap: 8 (ref 5–15)
BUN: 19 mg/dL (ref 6–20)
CO2: 24 mmol/L (ref 22–32)
Calcium: 8.1 mg/dL — ABNORMAL LOW (ref 8.9–10.3)
Chloride: 100 mmol/L (ref 98–111)
Creatinine, Ser: 0.55 mg/dL — ABNORMAL LOW (ref 0.61–1.24)
GFR calc Af Amer: >60 mL/min (ref >60)
GFR calc non Af Amer: >60 mL/min (ref >60)
Glucose, Bld: 111 mg/dL — ABNORMAL HIGH (ref 70–99)
Potassium: 4.1 mmol/L (ref 3.5–5.1)
SODIUM: 132 mmol/L — AB (ref 135–145)

## 2019-01-14 LAB — MAGNESIUM: Magnesium: 2.1 mg/dL (ref 1.7–2.4)

## 2019-01-14 LAB — TRIGLYCERIDES: Triglycerides: 170 mg/dL — ABNORMAL HIGH (ref <150)

## 2019-01-14 LAB — PHOSPHORUS: Phosphorus: 4.9 mg/dL — ABNORMAL HIGH (ref 2.5–4.6)

## 2019-01-14 MED ORDER — POLYETHYLENE GLYCOL 3350 17 G PO PACK
17.0000 g | PACK | Freq: Two times a day (BID) | ORAL | Status: DC
  Administered 2019-01-14 – 2019-01-19 (×7): 17 g via ORAL
  Filled 2019-01-14 (×8): qty 1

## 2019-01-14 MED ORDER — TRACE MINERALS CR-CU-MN-SE-ZN 10-1000-500-60 MCG/ML IV SOLN
INTRAVENOUS | Status: AC
  Administered 2019-01-14: 18:00:00 via INTRAVENOUS
  Filled 2019-01-14: qty 720

## 2019-01-14 MED ORDER — POLYETHYLENE GLYCOL 3350 17 G PO PACK: 17.0000 g | PACK | Freq: Two times a day (BID) | ORAL | Status: DC

## 2019-01-14 MED ORDER — METOCLOPRAMIDE HCL 5 MG/ML IJ SOLN
5.0000 mg | Freq: Four times a day (QID) | INTRAMUSCULAR | Status: DC
  Administered 2019-01-14 – 2019-01-18 (×16): 5 mg via INTRAVENOUS
  Filled 2019-01-14 (×16): qty 2

## 2019-01-14 NOTE — Progress Notes (Signed)
PHARMACY - ADULT TOTAL PARENTERAL NUTRITION CONSULT NOTE   Pharmacy Consult for TPN Indication: Severe Pancreatitis  Patient Measurements: Height: 5' 10" (177.8 cm) Weight: 163 lb (73.9 kg) IBW/kg (Calculated) : 73 TPN AdjBW (KG): 78 Body mass index is 23.39 kg/m. Usual Weight: 70.4 kg  HPI: 27 yoM admitted on 3/14 with pancreatitis (precipitated by hypertriglyceridemia).Hypertriglyceridemia was treated with insulin drip.Abdominal x-ray suggested early bowel obstruction.NGT remains on suction. Unable to tolerate clears.  Central access: PICC 3/23 TPN start date: 3/23  Significant events:   3/21 Reglan added  3/22:TPN ordered but unable to start today d/t ordering cut off time of 12:00,PICC line was ordered, butnot placed, concern with leukocytosis  3/23 Albumin x 4 doses; TPN started without lipids  3/25 bilateral pleural effusions s/p L thoracentesis, 640mL out. PICC with possible malpositioning - held use of PICC line temporarily.  TPN not documented as held.  PICC positioning corrected on imaging at 15:55 post thoracentesis.  3/26 NGT removed, advancing diet.  3/27: spoke to Colleen Kennedy-Smith from GI team, ok to add lipid to TPN  3/28 Travasol on short supply; instructed to use Clinisol instead  ASSESSMENT                                                                                                           Today, 01/14/2019:  Glucose (CBG goal 100-150) - well controlled, at goal on sensitive SSI  None yesterday  Electrolytes - Na remains borderline low, Phos cont to be high despite no added phos, all others stable at goal  Renal - SCr, BUN:SCr stable WNL, bicarb improved; UOP adequate  LFTs - improved today except Alk phos increased,  Tbili remains elevated but improving  TGs - high at 170; admitted with TG > 5000; note lipids resumed to TPN 4 days ago  Prealbumin - low, decreased since admission  Current Nutrition - CLD   IVF -  none  NUTRITIONAL GOALS                                                                                             RD recs (3/26): Kcal:2100-2300 Protein:100-110g Fluid:2.1L/day  PLAN                                                                                                                           At 1800 today:  Continue TPN at 60 ml/hr. Had modified TPN on 3/28 to reduce dextrose (and lipids somewhat) in response to rising LFTs. Not clear if TPN is related to transaminitis, although LFTs did begin to rise with initiation of TPN. If LFTs continue to improve, will cautiously titrate TPN back to full rate (unless able to transition to PO intake)  New 3-in-1 TPN contains 108 g protein, 216 g dextrose, 43 g lipid, providing 1598 kcal (100% protein goals and 76% of kcal goals)  Add MVI, trace elements and famotidine to TPN (replacing pantoprazole d/t concern for hepatotoxicity)  Electrolytes: Increase Na, cont no phos; continue others unchanged; Cl:Ac 1:1  IVF per MD (none currently)  Continue sensitive SSI with CBG checks q8 hr as long as patient well controlled  BMet with mag and phos in the am  TPN lab panels on Mondays & Thursdays  Ridgely 01/14/2019, 9:21 AM Pager [phone]

## 2019-01-14 NOTE — Progress Notes (Addendum)
[facility] Progress Note  CC:  EtOH + Hypertriglyceridemia pancreatitis, ileus   Subjective: He reports feeling better today. He walked in the halls x 6 yesterday. Tolerating clear liquids. Abdomen bloated but no current pain.    Objective:  Vital signs in last 24 hours: Temp:  [98.2 F (36.8 C)-98.8 F (37.1 C)] 98.8 F (37.1 C) (03/31 0436) Pulse Rate:  [121-130] 121 (03/31 0436) Resp:  [18-22] 18 (03/31 0436) BP: (130-141)/(84-95) 130/84 (03/31 0436) SpO2:  [90 %-93 %] 91 % (03/31 0436) Weight:  [73.9 kg] 73.9 kg (03/31 0500) Last BM Date: 01/13/19 General:   Alert and oriented in NAD. Heart:  Tachycardic, no murmurs Pulm: Diminished in the bases otherwise clear. Abdomen: Soft, mildly distended, hypoactive BS x 4quads. NGT clamped.  Extremities:  Without edema. Neurologic:  Alert and  oriented x4;  grossly normal neurologically. Psych:  Alert and cooperative. Normal mood and affect. Skin: pink macular rash to neck, chest and abdomen.    Lab Results: Recent Labs    01/12/19 0407 01/13/19 0314 01/14/19 0433  WBC 13.1* 12.7* 11.8*  HGB 9.0* 8.7* 8.4*  HCT 28.5* 28.6* 26.9*  PLT 660* 611* 526*   BMET Recent Labs    01/12/19 0407 01/13/19 0314 01/14/19 0433  NA 133* 133* 132*  K 4.1 4.3 4.1  CL 100 101 100  CO2 '22 23 24  ' GLUCOSE 102* 105* 111*  BUN '16 18 19  ' CREATININE 0.62 0.64 0.55*  CALCIUM 8.2* 8.4* 8.1*   LFT Recent Labs    01/14/19 0433  PROT 5.8*  ALBUMIN 2.4*  AST 66*  ALT 118*  ALKPHOS 146*  BILITOT 1.6*  BILIDIR 1.2*  IBILI 0.4    Assessment / Plan:  1. 28 y.o. male with EtOH + Hypertriglyceridemia Pancreatitis. CT scan 01/10/19 showed stable severe pancreatitis with extensive pseudocyst formation, large left pleural effusion, and biliary sludge in the gallbladder. No pancreatic ductal dilation noted. TGs 170>>149.  -continue TPN -daily electrolytes and TGs   2. Generalized abdominal pain secondary to # 1.  Abdominal pain is well controlled on Morphine 21mIV Q 4hrs. No longer on Toradol. Pain management with caution d/t hx polyp drug abuse. -continue PPI IV BID -pain management per hospitalist  3.Small bowelIleussecondary to pancreatitis. Repeat Abd/Pelvic CT 3/22 showed gaseous distention of the proximal jejunum, distended to approximately 5 cm diametersecondary to ileus. NGT drained 50cc biliouis fluid this am.  -NGT dc'd -monitor abdominal distension, BMs -ambulate in hall qid as tolerated   4. Nausea/Vomiting. No current nausea or vomiting. Last received Zofran 3/25. -Zofran 460mpo or IV Q 6hrs PRN  5. Anemia, dilutional, + rectal bleeding.Iron32. Received Feraheme IV 3/23.Received 1 unit of PRBCs on 3/19 and 3/24. Hg 8.4 <<8.7 << 9.0. No further rectal bleeding. -Transfuse for Hg < 7 -Continue to monitor rectal bleeding -No plans for EGD/colonoscopy at this time  6. Leukocytosisd/t pleural effusion vs pancreatitis. Temp 102.3 on 3/29, Temp 3/31 98.8. WBC 11.8 << 12.7. Left pleural effusion/pneumonia improving. [facility] no [facility]. S/P Thorocentesis 3/25 cx no growth.  Defer pancreatic fluid drain/cx for now.  -Continue to monitor Temp and WBC -Repeat BC if fever or increase in WBC occurs  6. Elevated LFTs secondary to pancreatitis. CT showed gallbladder sludge, no gallstones. Alk phos 146 >> 126. AST 66<<85. ALT 118<<148. T. Bili 1.6 << 2.2. Direct bili 1.2.  3/18 Acute hepatitis panel negative. -continue to monitor hepatic panel   7.Malnutrition. Albumin 2.5. -continue TPN -clear  liquid diet as tolerated  8. Etoh abuse -no evidence of withddrawal -no alcohol discussed   9. Hyponatremia Na 132  10.Tachycardic. HR 100's - 120 -160. BP 120's - 140's/80-90's  11. New Pruritic Macuclar rash to neck, chest and abdomen possibly from medications. Off antibiotics.  -Hospitalist to follow -Benadryl PRN      LOS: 17 days   Noralyn Pick  01/14/2019,  8:03 AM    ________________________________________________________________________  [name] GI MD note:  I personally examined the patient, reviewed the data and agree with the assessment and plan described above.  Slowly improving. He feels much better since NG was removed this AM.  + flatus, + BMs yesterday and today.  Abd still distended and he feels bloated, worse after even small amounts of clear liquids.   He would like to try apple sauce.  I will advance him to full liquids, he knows to take it slowly.  He also knows to use narcotic pain meds only when really necessary because they will slow his bowels, stomach further. I am changing his reglan to scheduled dosing for the next 2-3 days to help stimulate his stomach a bit more.    Owens Loffler, MD [facility] Pager [phone]

## 2019-01-14 NOTE — Progress Notes (Addendum)
**Note De-Identified vi Obfusction** PROGRESS NOTE    Charles Dougherty  ZOX:[id number] DOB: 1991-02-03 DOA: 12/27/2018 PCP: [name], No Pcp Per   Brief Nrrtive:  [name] is  46 yer old mle with history of lcohol buse, substnce buse, smoker who presents to the emergencydeprtment with complints of bdominl pin. Admitted for the mngement of cute pncretitis relted to lcohol.Incidentlly found to hvesevere hypertriglyceridemi.He hs positive fmily history.Hospitl course remrkble for bdominl discomfort, distention .Abdominl x-ry suggested erly bowel obstruction. Generl surgery consulted nd he ws strted on conservtive mngement with NG tube decompression, n.p.o. sttus. Uncler if the pncretitis ws precipitted by lcohol or hypertriglyceridemi.Due to severe hypertriglyceridemi,westrted on insulin drip nd moved to stepdown. GI lso consulted.  Pt dmitted for severe pncretitis 2/2 elevted triglycerides nd etoh use.  Sty hs been complicted by ileus s/p NG tube, L sided exudtive effusion thought 2/2 pncretitis, nd fevers thought 2/2 pneumoni vs pncretitis.  Currently with improving PO intke, strting to tke more clers.  He's on TPN.  GI following.  Assessment & Pln:   Principl Problem:   Acute necrotizing pncretitis Active Problems:   Acute lcoholic pncretitis   Pncretitis   ETOH buse   Drug buse (HCC)   Cystitis   Ileus (HCC)   Hypertriglyceridemi   Abdominl pin   SBO (smll bowel obstruction) (HCC)   Hypophosphtemi   Hypomgnesemi   Hypoclcemi   AKI (cute kidney injury) (HCC)   Anemi   Fever   Pneumoni of left lower lobe due to infectious orgnism (HCC)   Abnorml liver enzymes   1 Acute necrotizing pncretitis secondry to lcohol nd hypertriglyceridemi  Ileus Presented 3/13 with elevted lipse nd triglycerides >5000 Dily drinker CT from 3/14 with cute edemtous interstitil pncretitis 3/22 CT with evidence of  necrotizing pncretitis.  New free fluid nd phlegmonous/inflmmtory mteril surrounding the pncrese extending into bilterl prcolic gutters nd pelvis (see report) PT hving BM's.   Pt on clers yesterdy, but c/o incresed bdominl distension/bloting on 3/26 PM.  Abdominl x ry repeted, suggestive of ileus.   Clmp NGT.  Continue clers (he's going to try to order trys tody). Repet CT scn from 3/28 with severe pncretitis with extensive pseudocyst formtion throught the retroperitoneum nd upper bdomen.  Biliry sludge in the gllbldder with definite findings to suggest n cute cholecystitis t this time.  Lrge L nd smll R pleurl effusions. TPN for nutrition GI following, pprecite recs  D/c meropenem.  Morphine prn pin (incresed dose due to bck pin).  D/c tordol now (s/p 5 dys).  #.  Bilterl Pleurl Effusions (L>R)  Left lower lobe telectsis/consolidtion (possible pneumoni) Noted on CXR from 3/25 Lst fever 3/25 PM Blood nd urine cx NGTD Leukocytosis present, but improving Negtive urine strep nd legionell Currently on meropenem for possible pneumoni nd necrotizing pncretitis S/p thorcentesis by IR on 3/26, with 640 cc hzy drk mber/te colored fluid Exudtive by light's.  Follow pending culture (NGTDx4), pH 7.5, nd cytology (rective ppering mesothelil cells).  Follow dd on mylse (24).  Possibly 2/2 cute pncretitis? Repet CXR 3/28 (notble for smll L pleurl effusion with persistent LLL telectsis) given effusions on CT 3/27.   Will hold off on repet thor for now, consider repet CXR in  few dys to ensure pleurl effusion stble.   2.  Severe hypertriglyceridemi [name] with fmily history of hypertriglyceridemi. [name] ws plced on the insulin drip with significnt improvement with elevted triglyceride levels.   Repet tomorrow Will need fibrte on d/c  3.  Smll bowel obstruction versus ileus secondry  to  pancreatitis Patient with 1.1 L output from NG tube over the past 24 hours.   Abdominal films 3/24 with ileus vs SBO, suspect ileus more likely as pt having BM's Continue simethicone 160 mg p.o. 4 times daily.   Patient started on IV Reglan per gastroenterology.  Will change to prn.  CT abdomen/pelvis as noted above Clamp NG and trial clears  6.  Fever Likely 2/2 above.  Currently on abx for possible pneumonia and necrotizing pancreatitis.   S/p thora as noted above Blood and urine cx ngtd. May need repeat imaging (planning for discussion with GI today). Last fever 3/29 Continue to monitor off abx, if recurrent, will need to reculture and consider restarting abx, but he remains hemodynamically stable.  7.  Hypophosphatemia/hypokalemia Replace and follow.   8.  Anemia S/p 1 unit on 3/19 and 1 unit on 3/24  Likely dilutional and secondary to worsening pancreatitis.   Patient with no overt bleeding.   Anemia panel consistent with anemia of chronic disease/iron deficiency anemia.  FOBT negative.   Per GI upper endoscopy to be done in the outpatient setting.  GI following.  9.  Hypocalcemia/hypoalbuminemia Hypocalcemia corrects with albumin Continue to monitor with TPN   10.  Transaminitis  Elevated Bilirubin Images done did not show any gallbladder stones or inflammation.  Patient noted to have diffuse fatty liver.   AST and ALT improving Bili improving Continue to monitor Negative acute hepatitis panel  11.  Thrombocytopenia  Thrombocytosis Likely secondary to alcoholic liver disease.  Lovenox discontinued.  No overt bleeding.  Now with elevated platelets.  12.  Suspected cystitis Per CT imaging.  Patient currently asymptomatic.  Urinalysis unremarkable.   13.  Acute kidney injury Resolved with hydration.  Follow.   14.  Hypoglycemic events 2/2 insulin gtt and NPO status. Now on TPN Follow   15 alcohol abuse No signs of withdrawal.  Alcohol cessation  stressed to patient.  Social work consulted.  Follow.  16.  Malnutrition Patient presented with acute pancreatitis.  Has essentially been n.p.o. since admission.  Prealbumin of 7.0.  TPN ordered and started the evening of 01/06/2019.  Patient also placed on IV albumin every 6 hours x1 day.  GI following.  # PICC in place, imaging distal PICC tip flipped back and could be entering azygos vein.  Now better positioned.  # Insomnia: benadryl ordered for insomia prn (ambien/restoril have not helped)  # Hypertension: start amlodipine  # Sinus Tachycardia: likely 2/2 above, continue to monitor  DVT prophylaxis: SCD Code Status: full  Family Communication: none at bedside Disposition Plan: pending improvement   Consultants:   GI  IR  Procedures:   none  Antimicrobials:  Anti-infectives (From admission, onward)   Start     Dose/Rate Route Frequency Ordered Stop   01/06/19 1800  meropenem (MERREM) 1 g in sodium chloride 0.9 % 100 mL IVPB  Status:  Discontinued     1 g 200 mL/hr over 30 Minutes Intravenous Every 8 hours 01/06/19 1722 01/11/19 1521   01/05/19 1800  levofloxacin (LEVAQUIN) IVPB 750 mg  Status:  Discontinued     750 mg 100 mL/hr over 90 Minutes Intravenous Every 24 hours 01/05/19 0954 01/05/19 1000   01/05/19 1800  levofloxacin (LEVAQUIN) IVPB 500 mg  Status:  Discontinued     500 mg 100 mL/hr over 60 Minutes Intravenous Every 24 hours 01/05/19 1012 01/05/19 1041   01/05/19 1130  levofloxacin (LEVAQUIN) IVPB 750 mg  Status:  Discontinued **Note De-Identified vi Obfusction** 750 mg 100 mL/hr over 90 Minutes Intrvenous Dily 01/05/19 1041 01/06/19 1715   01/04/19 1800  levofloxcin (LEVQUIN) IVPB 500 mg  Sttus:  Discontinued     500 mg 100 mL/hr over 60 Minutes Intrvenous Every 24 hours 01/04/19 1612 01/05/19 0954     Subjective: Feels  bit better.  Objective: Vitls:   01/13/19 1600 01/13/19 2038 01/14/19 0436 01/14/19 0500  BP:  (!) 141/95 130/84   Pulse: (!) 127 (!) 130 (!) 121    Resp: 20 20 18    Temp:  98.2 F (36.8 C) 98.8 F (37.1 C)   TempSrc:  Orl    SpO2:  90% 91%   Weight:    73.9 kg  Height:        Intke/Output Summry (Lst 24 hours) t 01/14/2019 1209 Lst dt filed t 01/14/2019 1114 Gross per 24 hour  Intke 10 ml  Output 375 ml  Net -365 ml   Filed Weights   01/08/19 0500 01/10/19 2052 01/14/19 0500  Weight: 77.6 kg 75.5 kg 73.9 kg    Exmintion:  Generl: No cute distress. Crdiovsculr: Hert sounds show  regulr rte, nd rhythm. Lungs: Cler to usculttion bilterlly. bdomen: Soft, nontender, distended Neurologicl: lert nd oriented 3. Moves ll extremities 4. Crnil nerves II through XII grossly intct. Skin: Wrm nd dry. No rshes or lesions. Extremities: No clubbing or cynosis. No edem. Psychitric: Mood nd ffect re norml. Insight nd judgment re pproprite.   Dt Reviewed: I hve personlly reviewed following lbs nd imging studies  CBC: Recent Lbs  Lb 01/08/19 0515  01/10/19 0915 01/11/19 0426 01/12/19 0407 01/13/19 0314 01/14/19 0433  WBC 12.7*   < > 12.3* 12.7* 13.1* 12.7* 11.8*  NEUTROBS 11.2*  --   --   --   --  9.9*  --   HGB 8.2*   < > 7.7* 8.4* 9.0* 8.7* 8.4*  HCT 25.5*   < > 26.7* 27.5* 28.5* 28.6* 26.9*  MCV 97.3   < > 109.0* 101.5* 100.4* 99.0 97.1  PLT 530*   < > 580* 657* 660* 611* 526*   < > = vlues in this intervl not displyed.   Bsic Metbolic Pnel: Recent Lbs  Lb 01/10/19 0336 01/11/19 0426 01/12/19 0407 01/13/19 0314 01/14/19 0433  N 135 134* 133* 133* 132*  K 4.2 4.1 4.1 4.3 4.1  CL 106 103 100 101 100  CO2 21* 22 22 23 24   GLUCOSE 135* 122* 102* 105* 111*  BUN 14 13 16 18 19   CRETININE 0.49* 0.59* 0.62 0.64 0.55*  CLCIUM 8.2* 8.0* 8.2* 8.4* 8.1*  MG 2.0 2.2 2.1 2.3 2.1  PHOS 3.6 4.3 5.1* 4.8* 4.9*   GFR: Estimted Cretinine Clernce: 145.7 mL/min () (by [employer] formul bsed on SCr of 0.55 mg/dL (L)). Liver Function Tests: Recent Lbs  Lb  01/10/19 0336 01/11/19 0426 01/12/19 0407 01/13/19 0314 01/14/19 0433  ST 104* 157* 97* 85* 66*  LT 128* 201* 161* 148* 118*  LKPHOS 86 112 117 126 146*  BILITOT 1.7* 1.7* 1.8* 2.2* 1.6*  PROT 5.7* 5.8* 5.9* 6.1* 5.8*  LBUMIN 2.5* 2.4* 2.5* 2.5* 2.4*   No results for input(s): LIPSE, MYLSE in the lst 168 hours. No results for input(s): MMONI in the lst 168 hours. Cogultion Profile: No results for input(s): INR, PROTIME in the lst 168 hours. Crdic Enzymes: No results for input(s): CKTOTL, CKMB, CKMBINDEX, TROPONINI in the lst 168 hours. BNP (lst 3 results) No results for input(s): PROBNP  in the last 8760 hours. HbA1C: No results for input(s): HGBA1C in the last 72 hours. CBG: Recent Labs  Lab 01/13/19 1311 01/13/19 1639 01/14/19 0030 01/14/19 0706 01/14/19 1123  GLUCAP 118* 115* 111* 115* 118*   Lipid Profile: Recent Labs    01/13/19 0314 01/14/19 0433  TRIG 149  157* 170*   Thyroid Function Tests: No results for input(s): TSH, T4TOTAL, FREET4, T3FREE, THYROIDAB in the last 72 hours. Anemia Panel: No results for input(s): VITAMINB12, FOLATE, FERRITIN, TIBC, IRON, RETICCTPCT in the last 72 hours. Sepsis Labs: Recent Labs  Lab 01/08/19 0515 01/08/19 0800  LATICACIDVEN 1.3 1.3    Recent Results (from the past 240 hour(s))  Culture, blood (Routine X 2) w Reflex to ID Panel     Status: None   Collection Time: 01/05/19  9:05 AM  Result Value Ref Range Status   Specimen Description   Final    BLOOD RIGHT HAND Performed at [facility], [address] [place], [place] [address]    Special Requests   Final    BOTTLES DRAWN AEROBIC ONLY Blood Culture adequate volume Performed at [facility], [address] [place], [place] [address]    Culture   Final    NO GROWTH 5 DAYS Performed at [facility], [address] [place], [place] [address]    Report Status 01/10/2019 FINAL  Final  Culture,  blood (Routine X 2) w Reflex to ID Panel     Status: None   Collection Time: 01/05/19  9:05 AM  Result Value Ref Range Status   Specimen Description   Final    BLOOD LEFT HAND Performed at [facility], [address] [place], [place] [address]    Special Requests   Final    BOTTLES DRAWN AEROBIC ONLY Blood Culture adequate volume Performed at [facility], [address] [place], [place] [address]    Culture   Final    NO GROWTH 5 DAYS Performed at [facility], [address] [place], [place] [address]    Report Status 01/10/2019 FINAL  Final  Culture, Urine     Status: None   Collection Time: 01/05/19 10:55 AM  Result Value Ref Range Status   Specimen Description   Final    URINE, CLEAN CATCH Performed at [facility], [address] [place], [place] [address]    Special Requests   Final    NONE Performed at [facility], [address] [place], [place] [address]    Culture   Final    NO GROWTH Performed at [facility], [address] [place], [place] [address]    Report Status 01/06/2019 FINAL  Final  Culture, body fluid-bottle     Status: None   Collection Time: 01/08/19  3:48 PM  Result Value Ref Range Status   Specimen Description PLEURAL  Final   Special Requests NONE  Final   Culture   Final    NO GROWTH 5 DAYS Performed at [facility], [address] [place], [place] [address]    Report Status 01/13/2019 FINAL  Final  Gram stain     Status: None   Collection Time: 01/08/19  3:48 PM  Result Value Ref Range Status   Specimen Description PLEURAL  Final   Special Requests NONE  Final   Gram Stain   Final    MODERATE WBC PRESENT, PREDOMINANTLY PMN  NO ORGANISMS SEEN Performed at [facility], [address], [place], [place] [address]    Report Status 01/08/2019 FINAL  Final         Radiology Studies: No results found.      Scheduled Meds:  . amLODipine  5 mg Oral Daily  . feeding supplement  1 Container Oral TID BM  . insulin aspart  0-9 Units Subcutaneous Q8H  . nicotine  14 mg Transdermal Daily  . polyethylene glycol  17 g Oral Daily  . simethicone  160 mg Oral QID  . sodium chloride flush  10-40 mL Intracatheter Q12H   Continuous Infusions: . sodium chloride 1,000 mL (01/08/19 1645)  . TPN ADULT (ION) 60 mL/hr at 01/13/19 1826  . TPN ADULT (ION)       LOS: 17 days    Time spent: over 30 min    Lacretia Nicks, MD [facility] Pager AMION  If 7PM-7AM, please contact night-coverage [email or web address] Password [facility] 01/14/2019, 12:09 PM

## 2019-01-15 ENCOUNTER — Encounter (HOSPITAL_COMMUNITY): Payer: Self-pay | Admitting: Radiology

## 2019-01-15 ENCOUNTER — Inpatient Hospital Stay (HOSPITAL_COMMUNITY): Payer: Federal, State, Local not specified - PPO

## 2019-01-15 LAB — GLUCOSE, CAPILLARY
Glucose-Capillary: 105 mg/dL — ABNORMAL HIGH (ref 70–99)
Glucose-Capillary: 115 mg/dL — ABNORMAL HIGH (ref 70–99)
Glucose-Capillary: 127 mg/dL — ABNORMAL HIGH (ref 70–99)

## 2019-01-15 LAB — COMPREHENSIVE METABOLIC PANEL
ALT: 131 U/L — AB (ref 0–44)
AST: 88 U/L — ABNORMAL HIGH (ref 15–41)
Albumin: 2.3 g/dL — ABNORMAL LOW (ref 3.5–5.0)
Alkaline Phosphatase: 175 U/L — ABNORMAL HIGH (ref 38–126)
Anion gap: 11 (ref 5–15)
BUN: 17 mg/dL (ref 6–20)
CO2: 23 mmol/L (ref 22–32)
CREATININE: 0.59 mg/dL — AB (ref 0.61–1.24)
Calcium: 8 mg/dL — ABNORMAL LOW (ref 8.9–10.3)
Chloride: 100 mmol/L (ref 98–111)
GFR calc Af Amer: >60 mL/min (ref >60)
GFR calc non Af Amer: >60 mL/min (ref >60)
GLUCOSE: 107 mg/dL — AB (ref 70–99)
Potassium: 3.9 mmol/L (ref 3.5–5.1)
Sodium: 134 mmol/L — ABNORMAL LOW (ref 135–145)
Total Bilirubin: 1.5 mg/dL — ABNORMAL HIGH (ref 0.3–1.2)
Total Protein: 5.9 g/dL — ABNORMAL LOW (ref 6.5–8.1)

## 2019-01-15 LAB — CBC
HCT: 27.5 % — ABNORMAL LOW (ref 39.0–52.0)
Hemoglobin: 8.2 g/dL — ABNORMAL LOW (ref 13.0–17.0)
MCH: 29.6 pg (ref 26.0–34.0)
MCHC: 29.8 g/dL — ABNORMAL LOW (ref 30.0–36.0)
MCV: 99.3 fL (ref 80.0–100.0)
Platelets: 512 10*3/uL — ABNORMAL HIGH (ref 150–400)
RBC: 2.77 MIL/uL — ABNORMAL LOW (ref 4.22–5.81)
RDW: 13.5 % (ref 11.5–15.5)
WBC: 10.6 10*3/uL — ABNORMAL HIGH (ref 4.0–10.5)
nRBC: 0 % (ref 0.0–0.2)

## 2019-01-15 LAB — TRIGLYCERIDES: Triglycerides: 177 mg/dL — ABNORMAL HIGH (ref <150)

## 2019-01-15 LAB — MAGNESIUM: Magnesium: 2.3 mg/dL (ref 1.7–2.4)

## 2019-01-15 LAB — PHOSPHORUS: Phosphorus: 4.8 mg/dL — ABNORMAL HIGH (ref 2.5–4.6)

## 2019-01-15 MED ORDER — ACETAMINOPHEN 325 MG PO TABS
650.0000 mg | ORAL_TABLET | ORAL | Status: DC | PRN
  Administered 2019-01-15 – 2019-01-17 (×2): 650 mg via ORAL
  Filled 2019-01-15 (×2): qty 2

## 2019-01-15 MED ORDER — SODIUM CHLORIDE 0.9 % IV BOLUS
500.0000 mL | Freq: Once | INTRAVENOUS | Status: AC
  Administered 2019-01-15: 500 mL via INTRAVENOUS

## 2019-01-15 MED ORDER — SODIUM CHLORIDE 0.9 % IV SOLN
INTRAVENOUS | Status: AC
  Administered 2019-01-15: 13:00:00 via INTRAVENOUS

## 2019-01-15 MED ORDER — IOHEXOL 350 MG/ML SOLN
100.0000 mL | Freq: Once | INTRAVENOUS | Status: AC | PRN
  Administered 2019-01-15: 14:00:00 100 mL via INTRAVENOUS

## 2019-01-15 MED ORDER — DIPHENHYDRAMINE HCL 25 MG PO CAPS
25.0000 mg | ORAL_CAPSULE | Freq: Four times a day (QID) | ORAL | Status: DC | PRN
  Administered 2019-01-15 – 2019-01-16 (×2): 25 mg via ORAL
  Filled 2019-01-15 (×3): qty 1

## 2019-01-15 MED ORDER — SODIUM CHLORIDE (PF) 0.9 % IJ SOLN
INTRAMUSCULAR | Status: AC
  Filled 2019-01-15: qty 50

## 2019-01-15 MED ORDER — TRACE MINERALS CR-CU-MN-SE-ZN 10-1000-500-60 MCG/ML IV SOLN
INTRAVENOUS | Status: AC
  Administered 2019-01-15: 18:00:00 via INTRAVENOUS
  Filled 2019-01-15: qty 720

## 2019-01-15 MED ORDER — ENSURE ENLIVE PO LIQD
237.0000 mL | Freq: Two times a day (BID) | ORAL | Status: DC
  Administered 2019-01-15 – 2019-01-16 (×4): 237 mL via ORAL

## 2019-01-15 NOTE — Progress Notes (Addendum)
[facility] Progress Note  CC:   EtOH + Hypertriglyceridemia pancreatitis, ileus  Subjective: He wishes to try a small amount of full liquids today. No N/V. Less abdominal pain, no pain med from 2pm to 10pm yesterday. Passed a brown loose BM last night and this am, no blood. Passing more gas per the rectum.He complains of a sharp pain left chest below the nipple which was more noticeable after his NGT was dc'd yesterday, pain increases with inspiration. He has some SOB when walking in the hall. No cough or hemoptysis. No dizziness or feeling light headed. Ambulating up in room. Rash to neck, chest and abdomen less itchy today.   Objective:  Vital signs in last 24 hours: Temp:  [99.5 F (37.5 C)-99.8 F (37.7 C)] 99.5 F (37.5 C) (04/01 0549) Pulse Rate:  [117-124] 117 (04/01 0549) Resp:  [20] 20 (04/01 0549) BP: (128-138)/(86-90) 128/86 (04/01 0549) SpO2:  [91 %] 91 % (04/01 0549) Last BM Date: 01/14/19 General:   Alert and oriented in NAD Eyes: Sclera white, conjunctive pink Heart:  Tachycardic, no murmurs or rubs Pulm: Lungs clear, decreased  Abdomen: Soft, mildly distended, nontender, hypoactive BS x 4 quads. Extremities:  Without edema. Neurologic:  Alert and  oriented x4;  grossly normal neurologically. Psych:  Alert and cooperative. Normal mood and affect. Skin: pale pink macular rash on chest and abdomen fading  Intake/Output from previous day: 03/31 0701 - 04/01 0700 In: 1965.9 [I.V.:1965.9] Out: 1500 [Urine:1275; Emesis/NG output:225] Intake/Output this shift: No intake/output data recorded.  Lab Results: Recent Labs    01/13/19 0314 01/14/19 0433 01/15/19 0351  WBC 12.7* 11.8* 10.6*  HGB 8.7* 8.4* 8.2*  HCT 28.6* 26.9* 27.5*  PLT 611* 526* 512*   BMET Recent Labs    01/13/19 0314 01/14/19 0433 01/15/19 0351  NA 133* 132* 134*  K 4.3 4.1 3.9  CL 101 100 100  CO2 _0 GLUCOSE 105* 111* 107*  BUN _1 CREATININE 0.64  0.55* 0.59*  CALCIUM 8.4* 8.1* 8.0*   LFT Recent Labs    01/14/19 0433 01/15/19 0351  PROT 5.8* 5.9*  ALBUMIN 2.4* 2.3*  AST 66* 88*  ALT 118* 131*  ALKPHOS 146* 175*  BILITOT 1.6* 1.5*  BILIDIR 1.2*  --   IBILI 0.4  --    PT/INR No results for input(s): LABPROT, INR in the last 72 hours. Hepatitis Panel No results for input(s): HEPBSAG, HCVAB, HEPAIGM, HEPBIGM in the last 72 hours.  No results found.  Assessment / Plan:  1. 28 y.o. male with EtOH + Hypertriglyceridemia Pancreatitis.CT scan 01/10/19 showed stable severe pancreatitis with extensive pseudocyst formation, large left pleural effusion, and biliary sludge in the gallbladder. No pancreatic ductal dilation noted. TGs 177>>170>>149. -continue TPN -daily electrolytes and TGs   2.Generalized abdominal pain secondary to # 1.Abdominal pain is well controlled on Morphine 19mIV Q 4hrs. No longer on Toradol.Pain management with caution d/t hx polyp drug abuse. -continue PPI IV BID -pain management per hospitalist  3.Small bowelIleussecondary to pancreatitis. Repeat Abd/Pelvic CT 3/22 showed gaseous distention of the proximal jejunum, distended to approximately 5 cm diametersecondary to ileus. NGT dc'd 3/31. -monitor abdominal distension, BMs -ambulate in hall qid as tolerated   4. Nausea/Vomiting. No current nausea or vomiting. Last received Zofran 3/25. -Zofran 466mpo or IV Q 6hrs PRN  5.Anemia, dilutional, + rectal bleeding.Iron32. Received Feraheme IV 3/23.Received 1 unit of PRBCs on 3/19 and 3/24. Hg 8.2 <<8.4. No  further rectal bleeding. -Transfuse for Hg < 7 -Continue to monitor rectal bleeding -No plans for EGD/colonoscopy at this time  6. Left chest pain, pleuritic in setting of tachycardia.  -I discussed new pleuritic chest pain, mild SOB with ambulation in setting of continued tachycardia with Dr. Allyson Sabal. -Chest CT with IV contrast to rule out PE  7.Leukocytosisd/t pleural effusion vs  pancreatitis.Temp 102.3 on 3/29, Temp 4/1 99.5. WBC 10.6 << 11.8.  Left pleural effusion/pneumonia improving. [facility] 3/22 no growth. S/P Thorocentesis 3/25 cx no growth. Defer pancreatic fluid drain/cx for now.  -Continue to monitor Temp and WBC -Repeat BC if fever or increase in WBC occurs  8. Elevated LFTs secondary to pancreatitis.CT showed gallbladder sludge, no gallstones. Alk phos 175>>146. AST 88>> 66.  ALT 131 >> 118. T. bil 1.5 << 1.6. 3/18 Acute hepatitis panel negative. -continue to monitor hepatic panel   9.Malnutrition. Albumin 2.3. -continue TPN -clear liquid diet as tolerated, may try full liquid small amount today  10. Etoh abuse -no evidence of withddrawal -no alcohol discussed  11. Hyponatremia Na 132  12.Tachycardic. HR 100's - 120 -160. BP 120's - 140's/80-90's  13. New Pruritic Macuclar rash to neck, chest and abdomen possibly from medications. Off antibiotics. Rash less noticeable today. -Hospitalist to follow -Benadryl PRN     LOS: 18 days   Charles Dougherty  01/15/2019, 9:15 AM  ________________________________________________________________________  Charles Dougherty GI MD note:  I personally examined the patient, reviewed the data and agree with the assessment and plan described above.  Severe pancreatitis, slowly recovering.  I walked with him in the hall today.  He's tolerating clear liquids but in limited amounts. Holding off on pain meds as best that he can.  I will change him to full liquids this morning and will order Ensure BID.  Hopefully he'll be able to meet his nutritional needs in next few days and TNA can be stopped.    Owens Loffler, MD [facility] Pager [phone]

## 2019-01-15 NOTE — Progress Notes (Signed)
   01/15/19 1957  Vitals  Temp 100.3 F (37.9 C)  Temp Source Oral  BP (!) 142/98  MAP (mmHg) 109  BP Location Right Arm  BP Method Automatic  Patient Position (if appropriate) Lying  Pulse Rate (!) 134  Resp 20  Oxygen Therapy  SpO2 90 %  O2 Device Room Air  MEWS Score  MEWS RR 0  MEWS Pulse 3  MEWS Systolic 0  MEWS LOC 0  MEWS Temp 0  MEWS Score 3  MEWS Score Color Yellow  mews initiated

## 2019-01-15 NOTE — Significant Event (Signed)
Rapid Response Event Note  Overview: Time Called: 2009 Arrival Time: 2015 Event Type: MEWS  Initial Focused Assessment: Patient lying in bed, appears comfortable and does not appear to be in any distress. Patient has no complaints at this time. Current low grade fever of 100.22F, tachycardic at 134. BP 142/98. Respiratory rate 20s-30s. Patient has been tachycardic, trending 110-125 bpm based on review of previous vital signs. Patient does state he has bloating in his abdomen, which has been ongoing during this admission, which he relates to his respiratory rate. He's not in distress, but notices it's difficult to take a full breath in related to his abdominal distention, therefor he notices himself taking shallower breaths. Patient has been up walking the hallways during the day, with no distress or complaints and using his incentive spirometer. Abdomen appears distended, firm. Per patient abdomen has not changed recently.  Interventions: Patient encouraged to use his incentive spirometer, placed on [facility] for oxygen saturation being 90% on room air. RN notified NP of vital signs and request PRN to treat low grade fever of 100.22F.   Primary RN made aware of PRN order in place for Labetalol if vital signs meet administration requirement.  Patient is willing and accepting of additional actions he can be doing to help him get better.  Plan of Care (if not transferred): Primary RN to continue to monitor vital signs. If heart rate and respiratory rate continue to remain elevated or increase, RN to notify NP for clinical guidance on need for modifying treatment plan. RN to contact rapid response when desired.   Event Summary:   at      at    [facility]

## 2019-01-15 NOTE — Progress Notes (Signed)
PROGRESS NOTE    Charles Dougherty  RUE:[id number] DOB: 08-29-1991 DOA: 12/27/2018 PCP: Patient, No Pcp Per   Brief Narrative:  Patient is a 28 year old male with history of alcohol abuse, substance abuse, smoker who presents to the emergencydepartment with complaints of abdominal pain. Admitted for the management of acute pancreatitis related to alcohol/hypertriglyceridemia.  found to havesevere hypertriglyceridemia. Hospital course remarkable for abdominal discomfort, distention .Abdominal x-ray suggested early bowel obstruction. General surgery consulted and he was started on conservative management with NG tube decompression, n.p.o. status. Unclear if the pancreatitis was precipitated by alcohol or hypertriglyceridemia.Due to severe hypertriglyceridemia,westarted on insulin drip and moved to stepdown. GI also consulted.  Pt admitted for severe pancreatitis 2/2 elevated triglycerides and etoh use.  Stay has been complicated by ileus s/p NG tube, L sided exudative effusion thought 2/2 pancreatitis, and fevers thought 2/2 pneumonia vs pancreatitis.  Currently with improving PO intake, starting to take more clears.  He's on TPN.  GI following.  Assessment & Plan:   Principal Problem:   Acute necrotizing pancreatitis Active Problems:   Acute alcoholic pancreatitis   Pancreatitis   ETOH abuse   Drug abuse (HCC)   Cystitis   Ileus (HCC)   Hypertriglyceridemia   Abdominal pain   SBO (small bowel obstruction) (HCC)   Hypophosphatemia   Hypomagnesemia   Hypocalcemia   AKI (acute kidney injury) (HCC)   Anemia   Fever   Pneumonia of left lower lobe due to infectious organism (HCC)   Abnormal liver enzymes   1 Acute necrotizing pancreatitis secondary to alcohol and hypertriglyceridemia  Ileus Presented 3/13 with elevated lipase and triglycerides >5000 Daily drinker CT from 3/14 with acute edematous interstitial pancreatitis 3/22 CT with evidence of necrotizing pancreatitis.  New  free fluid and phlegmonous/inflammatory material surrounding the pancrease extending into bilateral paracolic gutters and pelvis (see report) PT having BM's.  Tolerating clear liquids but still intermittently nauseous Pt on clears yesterday, but c/o increased abdominal distension/bloating on 3/26 PM.  Abdominal x ray repeated, suggestive of small bowel ileus removed NGT.  Continue clears (he's going to try to order trays today). Repeat CT scan from 3/28 with severe pancreatitis with extensive pseudocyst formation throught the retroperitoneum and upper abdomen.  Biliary sludge in the gallbladder without definite findings to suggest an acute cholecystitis at this time.  Large L and small R pleural effusions. TPN for nutrition until by mouth intake is established Appreciate gastroenterology recommendations, daily electrolytes and triglycerides, continue TPN D/c meropenem.  Morphine prn pain (increased dose due to back pain).  D/c toradol now (s/p 5 days).  #.  Bilateral Pleural Effusions (L>R)  Left lower lobe atelectasis/consolidation (possible pneumonia) Noted on CXR from 3/25 Last fever 3/25 PM Blood and urine cx NGTD Leukocytosis present, but improving Negative urine strep and legionella completed meropenem for possible pneumonia and necrotizing pancreatitis S/p thoracentesis by IR on 3/26, with 640 cc hazy dark amber/tea colored fluid Exudative by light's.  Follow pending culture (NGTDx4), pH 7.5, and cytology (reactive appearing mesothelial cells).  Follow add on amylase (24).  Possibly 2/2 acute pancreatitis? Repeat CXR 3/28 (notable for small L pleural effusion with persistent LLL atelectasis) given effusions on CT 3/27.   Will hold off on repeat thora for now, consider repeat CXR in a few days to ensure pleural effusion stable.   2.  Severe hypertriglyceridemia Patient with family history of hypertriglyceridemia. Patient was placed on the insulin drip with significant improvement with  elevated triglyceride levels.   Repeat tomorrow  Will need fibrate on d/c  3.  Small bowel obstruction versus ileus secondary to pancreatitis Patient with 1.1 L output from NG tube over the past 24 hours.   Abdominal films 3/24 with ileus vs SBO, suspect ileus more likely as pt having BM's Continue simethicone 160 mg p.o. 4 times daily.   Patient started on IV Reglan per gastroenterology.  Will change to prn.  CT abdomen/pelvis as noted above Clamp NG and trial clears  6.  Fever/pleuritic chest pain Likely 2/2 above.   Completed abx for possible pneumonia and necrotizing pancreatitis.   S/p thora as noted above Blood and urine cx ngtd. May need repeat imaging (planning for discussion with GI today). Last fever 3/29 Continue to monitor off abx, if recurrent, will need to reculture and consider restarting abx, but he remains hemodynamically stable. Patient also noted to be slightly tachycardic and hypoxic with pleuritic chest pain, will order CT of the chest to rule out PE  7.  Hypophosphatemia/hypokalemia Replace and follow.   8.  Anemia S/p 1 unit on 3/19 and 1 unit on 3/24  Likely dilutional and secondary to worsening pancreatitis.   Patient with no overt bleedheing.   Anemia panel consistent with anemia of chronic disease/iron deficiency anemia.  FOBT negative.   Per GI upper endoscopy to be done in the outpatient setting.  GI following.  9.  Hypocalcemia/hypoalbuminemia Hypocalcemia corrects with albumin Continue to monitor with TPN   10.  Transaminitis  Elevated Bilirubin Images done did not show any gallbladder stones or inflammation.  Patient noted to have diffuse fatty liver.   AST and ALT improving Bili improving Negative acute hepatitis panel  11.  Thrombocytopenia  Thrombocytosis Likely secondary to alcoholic liver disease.  Lovenox discontinued.  No overt bleeding.  Now with elevated platelets.  12.  Suspected cystitis Per CT imaging.  Patient currently  asymptomatic.  Urinalysis unremarkable.   13.  Acute kidney injury Resolved with hydration.  Follow.   14.  Hypoglycemic events 2/2 insulin gtt and NPO status. Now on TPN Follow   15 alcohol abuse No signs of withdrawal.  Alcohol cessation stressed to patient.  Social work consulted.  Follow.  16.  Malnutrition Patient presented with acute pancreatitis.  Has essentially been n.p.o. since admission.  Prealbumin of 7.0.  TPN ordered and started the evening of 01/06/2019.  Patient also placed on IV albumin every 6 hours x1 day.  GI following.  # PICC in place, imaging distal PICC tip flipped back and could be entering azygos vein.  Now better positioned.  # Insomnia: benadryl ordered for insomia prn (ambien/restoril have not helped)  # Hypertension: start amlodipine  # Sinus Tachycardia: likely 2/2 above, continue to monitor  DVT prophylaxis: SCD Code Status: full  Family Communication: none at bedside Disposition Plan: pending improvement   Consultants:   GI  IR  Procedures:   none  Antimicrobials:  Anti-infectives (From admission, onward)   Start     Dose/Rate Route Frequency Ordered Stop   01/06/19 1800  meropenem (MERREM) 1 g in sodium chloride 0.9 % 100 mL IVPB  Status:  Discontinued     1 g 200 mL/hr over 30 Minutes Intravenous Every 8 hours 01/06/19 1722 01/11/19 1521   01/05/19 1800  levofloxacin (LEVAQUIN) IVPB 750 mg  Status:  Discontinued     750 mg 100 mL/hr over 90 Minutes Intravenous Every 24 hours 01/05/19 0954 01/05/19 1000   01/05/19 1800  levofloxacin (LEVAQUIN) IVPB 500 mg  Status:  Discontinued     500 mg 100 mL/hr over 60 Minutes Intravenous Every 24 hours 01/05/19 1012 01/05/19 1041   01/05/19 1130  levofloxacin (LEVAQUIN) IVPB 750 mg  Status:  Discontinued     750 mg 100 mL/hr over 90 Minutes Intravenous Daily 01/05/19 1041 01/06/19 1715   01/04/19 1800  levofloxacin (LEVAQUIN) IVPB 500 mg  Status:  Discontinued     500 mg 100 mL/hr over  60 Minutes Intravenous Every 24 hours 01/04/19 1612 01/05/19 0954     Subjective: Feels a bit better.tolerating clear liquids, had a BM, wants to advance cautiously to full liquids  Objective: Vitals:   01/14/19 0436 01/14/19 0500 01/14/19 2133 01/15/19 0549  BP: 130/84  138/90 128/86  Pulse: (!) 121  (!) 124 (!) 117  Resp: Temp: 98.8 F (37.1 C)  99.8 F (37.7 C) 99.5 F (37.5 C)  TempSrc:      SpO2: 91%  91% 91%  Weight:  73.9 kg    Height:        Intake/Output Summary (Last 24 hours) at 01/15/2019 0812 Last data filed at 01/15/2019 0604 Gross per 24 hour  Intake 1965.85 ml  Output 1500 ml  Net 465.85 ml   Filed Weights   01/08/19 0500 01/10/19 2052 01/14/19 0500  Weight: 77.6 kg 75.5 kg 73.9 kg    Examination:  General: No acute distress. Cardiovascular: Heart sounds show a regular rate, and rhythm. Lungs: Clear to auscultation bilaterally. Abdomen: Soft, nontender, distended Neurological: Alert and oriented 3. Moves all extremities 4. Cranial nerves II through XII grossly intact. Skin: Warm and dry. No rashes or lesions. Extremities: No clubbing or cyanosis. No edema. Psychiatric: Mood and affect are normal. Insight and judgment are appropriate.   Data Reviewed: I have personally reviewed following labs and imaging studies  CBC: Recent Labs  Lab 01/11/19 0426 01/12/19 0407 01/13/19 0314 01/14/19 0433 01/15/19 0351  WBC 12.7* 13.1* 12.7* 11.8* 10.6*  NEUTROABS  --   --  9.9*  --   --   HGB 8.4* 9.0* 8.7* 8.4* 8.2*  HCT 27.5* 28.5* 28.6* 26.9* 27.5*  MCV 101.5* 100.4* 99.0 97.1 99.3  PLT 657* 660* 611* 526* 512*   Basic Metabolic Panel: Recent Labs  Lab 01/11/19 0426 01/12/19 0407 01/13/19 0314 01/14/19 0433 01/15/19 0351  NA 134* 133* 133* 132* 134*  K 4.1 4.1 4.3 4.1 3.9  CL 103 100 101 100 100  CO2 GLUCOSE 122* 102* 105* 111* 107*  BUN CREATININE 0.59* 0.62 0.64 0.55* 0.59*  CALCIUM 8.0* 8.2*  8.4* 8.1* 8.0*  MG 2.2 2.1 2.3 2.1 2.3  PHOS 4.3 5.1* 4.8* 4.9* 4.8*   GFR: Estimated Creatinine Clearance: 145.7 mL/min (A) (by C-G formula based on SCr of 0.59 mg/dL (L)). Liver Function Tests: Recent Labs  Lab 01/11/19 0426 01/12/19 0407 01/13/19 0314 01/14/19 0433 01/15/19 0351  AST 157* 97* 85* 66* 88*  ALT 201* 161* 148* 118* 131*  ALKPHOS 112 117 126 146* 175*  BILITOT 1.7* 1.8* 2.2* 1.6* 1.5*  PROT 5.8* 5.9* 6.1* 5.8* 5.9*  ALBUMIN 2.4* 2.5* 2.5* 2.4* 2.3*   No results for input(s): LIPASE, AMYLASE in the last 168 hours. No results for input(s): AMMONIA in the last 168 hours. Coagulation Profile: No results for input(s): INR, PROTIME in the last 168 hours. Cardiac Enzymes: No results for input(s): CKTOTAL, CKMB, CKMBINDEX, TROPONINI in the last 168 hours.  BNP (last 3 results) No results for input(s): PROBNP in the last 8760 hours. HbA1C: No results for input(s): HGBA1C in the last 72 hours. CBG: Recent Labs  Lab 01/14/19 0706 01/14/19 1123 01/14/19 1347 01/14/19 2131 01/15/19 0549  GLUCAP 115* 118* 107* 111* 105*   Lipid Profile: Recent Labs    01/14/19 0433 01/15/19 0351  TRIG 170* 177*   Thyroid Function Tests: No results for input(s): TSH, T4TOTAL, FREET4, T3FREE, THYROIDAB in the last 72 hours. Anemia Panel: No results for input(s): VITAMINB12, FOLATE, FERRITIN, TIBC, IRON, RETICCTPCT in the last 72 hours. Sepsis Labs: No results for input(s): PROCALCITON, LATICACIDVEN in the last 168 hours.  Recent Results (from the past 240 hour(s))  Culture, blood (Routine X 2) w Reflex to ID Panel     Status: None   Collection Time: 01/05/19  9:05 AM  Result Value Ref Range Status   Specimen Description   Final    BLOOD RIGHT HAND Performed at [facility], [address] [place], [place] [address]    Special Requests   Final    BOTTLES DRAWN AEROBIC ONLY Blood Culture adequate volume Performed at [facility], [address] [place], [place] [address]    Culture   Final    NO GROWTH 5 DAYS Performed at [facility], [address], [place], [place] [address]    Report Status 01/10/2019 FINAL  Final  Culture, blood (Routine X 2) w Reflex to ID Panel     Status: None   Collection Time: 01/05/19  9:05 AM  Result Value Ref Range Status   Specimen Description   Final    BLOOD LEFT HAND Performed at [facility], [address], [place], [place] [address]    Special Requests   Final    BOTTLES DRAWN AEROBIC ONLY Blood Culture adequate volume Performed at [facility], [address], [place], [place] [address]    Culture   Final    NO GROWTH 5 DAYS Performed at [facility], [address] [place], [place] [address]    Report Status 01/10/2019 FINAL  Final  Culture, Urine     Status: None   Collection Time: 01/05/19 10:55 AM  Result Value Ref Range Status   Specimen Description   Final    URINE, CLEAN CATCH Performed at [facility], [address] [place], [place] [address]    Special Requests   Final    NONE Performed at [facility], [address] [place], [place] [address]    Culture   Final    NO GROWTH Performed at [facility], [address] [place], [place] [address]    Report Status 01/06/2019 FINAL  Final  Culture, body fluid-bottle     Status: None   Collection Time: 01/08/19  3:48 PM  Result Value Ref Range Status   Specimen Description PLEURAL  Final   Special Requests NONE  Final   Culture   Final    NO GROWTH 5 DAYS Performed at [facility], [address], [place], [place] [address]    Report Status 01/13/2019 FINAL  Final  Gram stain     Status: None   Collection Time: 01/08/19  3:48 PM  Result Value Ref Range Status   Specimen Description PLEURAL  Final   Special Requests NONE  Final   Gram Stain   Final    MODERATE  WBC PRESENT, PREDOMINANTLY PMN NO  ORGANISMS SEEN Performed at [facility], [address] [place], [place] [address]    Report Status 01/08/2019 FINAL  Final         Radiology Studies: No results found.      Scheduled Meds: . amLODipine  5 mg Oral Daily  . feeding supplement  1 Container Oral TID BM  . insulin aspart  0-9 Units Subcutaneous Q8H  . metoCLOPramide (REGLAN) injection  5 mg Intravenous Q6H  . nicotine  14 mg Transdermal Daily  . polyethylene glycol  17 g Oral BID AC  . simethicone  160 mg Oral QID  . sodium chloride flush  10-40 mL Intracatheter Q12H   Continuous Infusions: . sodium chloride 1,000 mL (01/08/19 1645)  . TPN ADULT (ION) 60 mL/hr at 01/14/19 1732     LOS: 18 days    Time spent: over 30 min    Richarda Overlie, MD [facility] Pager AMION  If 7PM-7AM, please contact night-coverage [email or web address] Password [email or web address] 01/15/2019, 8:12 AM

## 2019-01-15 NOTE — Progress Notes (Signed)
PHARMACY - ADULT TOTAL PARENTERAL NUTRITION CONSULT NOTE   Pharmacy Consult for TPN Indication: Severe Pancreatitis  Patient Measurements: Height: 5\' 10"  (177.8 cm) Weight: 163 lb (73.9 kg) IBW/kg (Calculated) : 73 TPN [id number] [id number] [place] Lo66 [address]- G> held.  PICC positioning corrected on imaging at 15:55 post thoracentesis.  3/26 NGT removed, advancing diet.  3/27: spoke to Colleen Kennedy-Smith from GI team, ok to add lipid to TPN  3/28 Travasol on short supply; instructed to use Clinisol instead  4/1 transition to FLD?  ASSESSMENT                                                                                                           Today, 01/15/2019:  Glucose (CBG goal 100-150) - well controlled, at goal on sensitive SSI  None yesterday  Electrolytes - Na remains borderline low, Phos cont to be high despite no added phos, all others stable at goal  Renal - SCr, BUN:SCr stable WNL, bicarb improved; UOP adequate  LFTs - all increased,  Tbili remains elevated but improving  TGs - high at 177 and rising; admitted with TG > 5000; note lipids resumed to TPN 5 days ago.  Goal TG < 150 mg/DL  Prealbumin - low, decreased since admission  Current Nutrition -  CLD   IVF - none  NUTRITIONAL GOALS                                                                                             RD recs (3/26): Kcal:2100-2300 Protein:100-110g Fluid:2.1L/day  PLAN  At 1800 today:  Continue TPN at 60 ml/hr. Had modified TPN on 3/28 to reduce dextrose (and lipids somewhat) in response to rising LFTs.Will decrease dextrose and lipids again today.  Not clear if TPN is related to transaminitis, although LFTs did begin to rise with initiation of TPN. Hopefull pt will transition to full diet soon and can stop TPN.  New 3-in-1 TPN contains 108 g protein, 180 g dextrose, 36 g lipid, providing 1404 kcal (100% protein goals and 67% of kcal goals)  Add MVI, trace elements and famotidine to TPN (replacing pantoprazole d/t concern for hepatotoxicity)  Electrolytes: Increase Na, cont no phos; continue others unchanged; Cl:Ac 1:1  IVF per MD (none currently)  Continue sensitive SSI with CBG checks q8 hr as long as patient well controlled  TPN lab panels on Mondays & Thursdays  Arley Phenix RPh 01/15/2019, 10:13 AM Pager [phone]

## 2019-01-16 ENCOUNTER — Inpatient Hospital Stay (HOSPITAL_COMMUNITY): Payer: Federal, State, Local not specified - PPO

## 2019-01-16 LAB — COMPREHENSIVE METABOLIC PANEL
ALT: 129 U/L — ABNORMAL HIGH (ref 0–44)
AST: 86 U/L — ABNORMAL HIGH (ref 15–41)
Albumin: 2.2 g/dL — ABNORMAL LOW (ref 3.5–5.0)
Alkaline Phosphatase: 186 U/L — ABNORMAL HIGH (ref 38–126)
Anion gap: 8 (ref 5–15)
BUN: 15 mg/dL (ref 6–20)
CO2: 24 mmol/L (ref 22–32)
Calcium: 8 mg/dL — ABNORMAL LOW (ref 8.9–10.3)
Chloride: 102 mmol/L (ref 98–111)
Creatinine, Ser: 0.52 mg/dL — ABNORMAL LOW (ref 0.61–1.24)
GFR calc Af Amer: >60 mL/min (ref >60)
GFR calc non Af Amer: >60 mL/min (ref >60)
Glucose, Bld: 99 mg/dL (ref 70–99)
Potassium: 4 mmol/L (ref 3.5–5.1)
Sodium: 134 mmol/L — ABNORMAL LOW (ref 135–145)
Total Bilirubin: 1.1 mg/dL (ref 0.3–1.2)
Total Protein: 5.4 g/dL — ABNORMAL LOW (ref 6.5–8.1)

## 2019-01-16 LAB — BODY FLUID CELL COUNT WITH DIFFERENTIAL
Eos, Fluid: 0 %
Lymphs, Fluid: 13 %
Monocyte-Macrophage-Serous Fluid: 8 % — ABNORMAL LOW (ref 50–90)
Neutrophil Count, Fluid: 79 % — ABNORMAL HIGH (ref 0–25)
Total Nucleated Cell Count, Fluid: 2289 cu mm — ABNORMAL HIGH (ref 0–1000)

## 2019-01-16 LAB — PROTEIN, PLEURAL OR PERITONEAL FLUID: Total protein, fluid: 3.9 g/dL

## 2019-01-16 LAB — TRIGLYCERIDES: Triglycerides: 153 mg/dL — ABNORMAL HIGH (ref <150)

## 2019-01-16 LAB — LACTATE DEHYDROGENASE, PLEURAL OR PERITONEAL FLUID: LD, Fluid: 494 U/L — ABNORMAL HIGH (ref 3–23)

## 2019-01-16 LAB — PHOSPHORUS: Phosphorus: 5 mg/dL — ABNORMAL HIGH (ref 2.5–4.6)

## 2019-01-16 LAB — PROCALCITONIN: Procalcitonin: 0.56 ng/mL

## 2019-01-16 LAB — AMYLASE, PLEURAL OR PERITONEAL FLUID

## 2019-01-16 LAB — GLUCOSE, CAPILLARY: Glucose-Capillary: 94 mg/dL (ref 70–99)

## 2019-01-16 LAB — MAGNESIUM: Magnesium: 2.1 mg/dL (ref 1.7–2.4)

## 2019-01-16 LAB — GLUCOSE, PLEURAL OR PERITONEAL FLUID: Glucose, Fluid: 107 mg/dL

## 2019-01-16 LAB — AMYLASE, PLEURAL OR PERITONEAL FLUID??????? ??????: Amylase, Fluid: 43 U/L

## 2019-01-16 MED ORDER — KETOROLAC TROMETHAMINE 30 MG/ML IJ SOLN
60.0000 mg | Freq: Once | INTRAMUSCULAR | Status: AC
  Administered 2019-01-16: 13:00:00 60 mg via INTRAVENOUS
  Filled 2019-01-16: qty 2

## 2019-01-16 MED ORDER — LIDOCAINE HCL 1 % IJ SOLN
INTRAMUSCULAR | Status: AC
  Filled 2019-01-16: qty 20

## 2019-01-16 MED ORDER — IBUPROFEN 200 MG PO TABS
400.0000 mg | ORAL_TABLET | Freq: Four times a day (QID) | ORAL | Status: DC | PRN
  Administered 2019-01-16 – 2019-01-19 (×5): 400 mg via ORAL
  Filled 2019-01-16 (×5): qty 2

## 2019-01-16 MED ORDER — SODIUM CHLORIDE 0.9 % IV BOLUS
500.0000 mL | Freq: Once | INTRAVENOUS | Status: AC
  Administered 2019-01-16: 500 mL via INTRAVENOUS

## 2019-01-16 MED ORDER — TRACE MINERALS CR-CU-MN-SE-ZN 10-1000-500-60 MCG/ML IV SOLN
INTRAVENOUS | Status: AC
  Administered 2019-01-16: 17:00:00 via INTRAVENOUS
  Filled 2019-01-16: qty 720

## 2019-01-16 NOTE — Progress Notes (Signed)
Supervising Physician: Gilmer Mor  Patient Status:  Charles Dougherty - In-pt  Chief Complaint: Left sided chest pain s/p left thoracentesis earlier today.  Subjective:  Upon presentation to room Charles Dougherty is laying comfortably in bed watching TV. He tells me that his chest pain is located on the left side on the bottom port of his chest as well as near his left shoulder, the pain is similar to the pain he has been experiencing for awhile now but is worsened when he walks/repositions himself and when he takes a deep breath. He denies any cough or dyspnea at rest. He does not endorse pain at the thoracentesis site. He states that he was given Toradol a little while ago and this helped his pain quite a bit although the pain is still present. He denies any other complaints currently.   Allergies: Shellfish-derived products  Medications: Prior to Admission medications   Medication Sig Start Date End Date Taking? Authorizing Provider  calcium carbonate (TUMS - DOSED IN MG ELEMENTAL CALCIUM) 500 MG chewable tablet Chew 1 tablet by mouth as needed for indigestion or heartburn.   Yes [provider]  famotidine-calcium carbonate-magnesium hydroxide (PEPCID COMPLETE) [phone] MG chewable tablet Chew 1 tablet by mouth once.   Yes [provider]  omeprazole (PRILOSEC OTC) 20 MG tablet Take 20 mg by mouth daily as needed (for acid reflex).   Yes [provider]  Probiotic Product (PROBIOTIC DAILY) CAPS Take 1 capsule by mouth once.   Yes [provider]  Triprolidine-Pseudoephedrine (ANTIHISTAMINE PO) Take 1 tablet by mouth once.   Yes [provider]     Vital Signs: BP 118/79 (BP Location: Right Arm)    Pulse (!) 109    Temp 98.7 F (37.1 C) (Oral)    Resp (!) 22    Ht  (1.778 m)    Wt 167 lb 5.3 oz (75.9 kg)    SpO2 95%    BMI 24.01 kg/m   Physical Exam Vitals signs and nursing note reviewed.  Constitutional:      General: He is not in acute  distress.    Comments: Able to speak in complete sentences without issue. Pleasant, talkative, good historian.  HENT:     Head: Normocephalic.  Cardiovascular:     Rate and Rhythm: Regular rhythm. Tachycardia present.  Pulmonary:     Effort: Pulmonary effort is normal.     Breath sounds: Normal breath sounds.     Comments: Able to take full deep breaths although this is painful. Able to reposition/sit up in bed with some pain as well. Thoracentesis site non tender, no erythema, edema, hematoma, crepitus, discharge or bleeding. Skin:    General: Skin is warm and dry.  Neurological:     Mental Status: He is alert.     Imaging: Dg Chest 1 View  Result Date: 01/16/2019 CLINICAL DATA:  Post left thoracentesis EXAM: CHEST  1 VIEW COMPARISON:  CT 01/15/2019 FINDINGS: Small left pleural effusion remains following thoracentesis. No pneumothorax. Left PICC line is in place with the tip at the cavoatrial junction. Left base atelectasis. Right perihilar atelectasis. Heart is normal size. IMPRESSION: Decreasing left effusion following thoracentesis with small residual left effusion. No pneumothorax. Right perihilar and left basilar atelectasis. Electronically Signed   By: Charlett Nose M.D.   On: 01/16/2019 10:50   Ct Angio Chest Pe W Or Wo Contrast  Result Date: 01/15/2019 CLINICAL DATA:  28 year old male with left-sided chest pain history of left exudative  pleural effusion thought secondary to recent pancreatitis. EXAM: CT ANGIOGRAPHY CHEST WITH CONTRAST TECHNIQUE: Multidetector CT imaging of the chest was performed using the standard protocol during bolus administration of intravenous contrast. Multiplanar CT image reconstructions and MIPs were obtained to evaluate the vascular anatomy. CONTRAST:  OMNIPAQUE IOHEXOL 350 MG/ML SOLN COMPARISON:  Prior CT scan of the abdomen and pelvis 01/10/2019 FINDINGS: Cardiovascular: Satisfactory opacification of the pulmonary arteries to the segmental level. No  evidence of pulmonary embolism. Normal heart size. No pericardial effusion. Left upper extremity approach PICC. Catheter tip terminates in the mid SVC. Mediastinum/Nodes: No enlarged mediastinal, hilar, or axillary lymph nodes. Thyroid gland, trachea, and esophagus demonstrate no significant findings. Lungs/Pleura: Large layering left pleural effusion without evidence of pleural thickening or enhancement. There is associated atelectasis of the left lower lobe. Linear atelectasis in the right lower lobe as well. No focal airspace consolidation, pulmonary edema or pneumothorax. Upper Abdomen: Incompletely imaged perigastric fluid collection, likely related to prior pancreatitis. No new or acute abnormality within the upper abdomen. Musculoskeletal: No acute fracture or aggressive appearing lytic or blastic osseous lesion. Review of the MIP images confirms the above findings. IMPRESSION: 1. Large but simple appearing layering left pleural effusion with associated left lower lobe atelectasis. 2. Segmental right lower lobe atelectasis. 3. Well-positioned left upper extremity approach PICC. Electronically Signed   By: Malachy Moan M.D.   On: 01/15/2019 15:07   Dg Chest Port 1 View  Result Date: 01/16/2019 CLINICAL DATA:  Left-sided chest pain. Patient underwent thoracentesis today. EXAM: PORTABLE CHEST 1 VIEW COMPARISON:  01/16/2019 at 10:48 a.m. FINDINGS: No change from the earlier exam.  No pneumothorax. Persistent left lung base opacity consistent with a combination of a small residual effusion with either atelectasis or pneumonia. Mild atelectasis extending laterally and inferiorly from the right hilum is also unchanged. No new lung abnormalities. Stable left sided PICC. IMPRESSION: 1. No change from the earlier exam.  No pneumothorax. Electronically Signed   By: Amie Portland M.D.   On: 01/16/2019 13:35   US Thoracentesis Asp Pleural Space W/img Guide  Result Date: 01/16/2019 INDICATION: Patient with acute  necrotizing pancreatitis secondary to ETOH abuse and severe hypertriglyceridemia with left sided chest pain, dyspnea with exertion and fever. Imaging shows recurrent left pleural effusion. Request for diagnostic and therapeutic thoracentesis today in IR. EXAM: ULTRASOUND GUIDED LEFT THORACENTESIS MEDICATIONS: 10 mL 1% lidocaine COMPLICATIONS: None immediate. PROCEDURE: An ultrasound guided thoracentesis was thoroughly discussed with the patient and questions answered. The benefits, risks, alternatives and complications were also discussed. The patient understands and wishes to proceed with the procedure. Written consent was obtained. Ultrasound was performed to localize and mark an adequate pocket of fluid in the left chest. The area was then prepped and draped in the normal sterile fashion. 1% Lidocaine was used for local anesthesia. Under ultrasound guidance a 6 Fr Safe-T-Centesis catheter was introduced. Thoracentesis was performed. The catheter was removed and a dressing applied. FINDINGS: A total of approximately 1.3 L of dark golden fluid was removed - patient developed symptomatic hypotension during procedure and as such procedure was aborted, residual fluid remains on post procedure ultrasound. Samples were sent to the laboratory as requested by the clinical team. IMPRESSION: Successful ultrasound guided left thoracentesis yielding 1.3 L of pleural fluid. Read by Lynnette Caffey, PA-C Electronically Signed   By: Gilmer Mor D.O.   On: 01/16/2019 10:50    Labs:  CBC: Recent Labs    01/12/19 0407 01/13/19 7092 01/14/19 9574  01/15/19 0351  WBC 13.1* 12.7* 11.8* 10.6*  HGB 9.0* 8.7* 8.4* 8.2*  HCT 28.5* 28.6* 26.9* 27.5*  PLT 660* 611* 526* 512*    COAGS: Recent Labs    12/28/18 0524 01/06/19 0503  INR 0.9 1.3*    BMP: Recent Labs    01/13/19 0314 01/14/19 0433 01/15/19 0351 01/16/19 0324  NA 133* 132* 134* 134*  K 4.3 4.1 3.9 4.0  CL 101 100 100 102  CO2 23 24 23 24    GLUCOSE 105* 111* 107* 99  BUN 18 19 17 15   CALCIUM 8.4* 8.1* 8.0* 8.0*  CREATININE 0.64 0.55* 0.59* 0.52*  GFRNONAA >60 >60 >60 >60  GFRAA >60 >60 >60 >60    LIVER FUNCTION TESTS: Recent Labs    01/13/19 0314 01/14/19 0433 01/15/19 0351 01/16/19 0324  BILITOT 2.2* 1.6* 1.5* 1.1  AST 85* 66* 88* 86*  ALT 148* 118* 131* 129*  ALKPHOS 126 146* 175* 186*  PROT 6.1* 5.8* 5.9* 5.4*  ALBUMIN 2.5* 2.4* 2.3* 2.2*    Assessment and Plan:  28 y/o M admitted for necrotizing pancreatitis 2/2 ETOH abuse + hypertriglyceridemia followed closely by GI with ongoing left sided chest pain acutely worsened after thoracentesis today. CTA was obtained on 4/1 by primary team due to ongoing pain and concern for PE which was (-) for PE but did show large left pleural effusion for which he underwent successful thoracentesis today in IR with 1.3 L removed. Prior to and following the procedure he did complain of left sided chest pain - post procedure CXR negative for pneumothorax.  Unfortunately he has continued to have left sided chest pain especially with movement or deep breathing - IR was asked to evaluate patient. Repeat CXR unchanged from post procedure CXR, vital signs have remained stable per RN - he is tachycardic although this has been his baseline since admission, he does not endorse any other complaints. Thoracentesis site is unremarkable on my exam today.  Given previously known ongoing left sided chest pain worse with inspiration and movement, better with Toradol, stable vital signs and several negative imaging studies it is unlikely that his chest pain today is a direct result of the thoracentesis performed earlier today and is more likely an acute exacerbation of pleurisy vs referred pain from necrotizing pancreatitis.   I discussed post thoracentesis chest pain 2/2 lung re-expansion as well as pleuritic and referred chest pain with Charles Dougherty today. He has a good understanding of these concepts and  will continue to monitor his symptoms and report any changes to his care team. Please call IR with any further questions or concerns.    Electronically Signed: Villa Herb, PA-C 01/16/2019, 2:31 PM  Pager# [phone]   I spent a total of 25 Minutes at the the patient's bedside AND on the patient's hospital floor or unit, greater than 50% of which was counseling/coordinating care for post thoracentesis chest pain.

## 2019-01-16 NOTE — Progress Notes (Signed)
Nutrition Follow-up  INTERVENTION:   -TPN per Pharmacy -Continue Ensure Enlive po BID, each supplement provides 350 kcal and 20 grams of protein  NUTRITION DIAGNOSIS:   Increased nutrient needs related to (ETOH pancreatitis) as evidenced by estimated needs.  Ongoing.  GOAL:   Patient will meet greater than or equal to 90% of their needs  Progressing.  MONITOR:   PO intake, Supplement acceptance, Diet advancement, Weight trends, Skin, Other (Comment)(TPN regimen)  ASSESSMENT:   28 year old male with history of alcohol abuse, substance abuse, smoker who presents to the emergency department with complaints of abdominal pain. Admitted for the management of acute pancreatitis related to alcohol.    **RD working remotely**  3/14: CT scan revealed inflammation of the hepatic flexure of the colon, possible ileus/obstruction. NPO. 3/23: Moved to ICU. Remains NPO. PICC placed,  TPN initiated. 3/26: Diet advanced to CLD, Boost Breeze ordered. NGT removed. 4/1 : Diet advanced to FLD. Ensure supplements ordered. 4/2: s/p thoracentesis, yield: 1.3L  Patient continues to receive TPN at 60 ml/hr, providing 1404 kcal and 108g protein. Pt is drinking Ensure supplements and tolerating.   Per weight records, weight is +14 lb since admission on  3/14.  Medications: IV Reglan every 6 hours Labs reviewed: Low Na CBGs: 94-127 Elevated Phos Mg WNL TG: 153 mg/dL  Diet Order:   Diet Order            Diet full liquid Room service appropriate? Yes; Fluid consistency: Thin  Diet effective now              EDUCATION NEEDS:   No education needs have been identified at this time  Skin:  Skin Assessment: Reviewed RN Assessment  Last BM:  3/25  Height:   Ht Readings from Last 1 Encounters:  12/30/18 5\' 10"  (1.778 m)    Weight:   Wt Readings from Last 1 Encounters:  01/16/19 75.9 kg    Ideal Body Weight:  75.5 kg  BMI:  Body mass index is 24.01 kg/m.  Estimated  Nutritional Needs:   Kcal:  2100-2300  Protein:  100-110g  Fluid:  2.1L/day  Tilda Franco, MS, RD, LDN [facility] Inpatient Clinical Dietitian Pager: [phone] After Hours Pager: [phone]

## 2019-01-16 NOTE — Progress Notes (Signed)
PHARMACY - ADULT TOTAL PARENTERAL NUTRITION CONSULT NOTE   Pharmacy Consult for TPN Indication: Severe Pancreatitis  Patient Measurements: Height: 5\' 10"  (177.8 cm) Weight: 167 lb 5.3 oz (75.9 kg) IBW/kg (Calculated) : 73 TPN AdjBW (KG): 78 Body mass index is 24.01 kg/m. Usual Weight: 70.4 kg  HPI: 27 yoM admitted on 3/14 with pancreatitis (precipitated by hypertriglyceridemia).Hypertriglyceridemia was treated with insulin drip.Abdominal x-ray suggested early bowel obstruction.NGT remains on suction. Unable to tolerate clears.  Central access: PICC 3/23 TPN start date: 3/23  Significant events:   3/21 Reglan added  3/22:TPN ordered but unable to start today d/t ordering cut off time of 12:00,PICC line was ordered, butnot placed, concern with leukocytosis  3/23 Albumin x 4 doses; TPN started without lipids  3/25 bilateral pleural effusions s/p L thoracentesis, <MEASUREMEDa[Solar [id number] Behavioral HospiMcarthur RoHazle QuantProvidence Little Company Of Mary Subacute Care CenterD75W40WilfoAubPine Valley Specialty HospAnamosa Community HospitalitBel<MEASUREMENTDa[Gadsden RegionAlba([id number] Orthopedic SMcarthur RoHazle QuantTruckee Surgery Center LLCD71W40WilfoAubCommunity Health Center Of Branch CoGreater Regional Medical Cen<MEASUREMENTDa[Uh CanAlba([phone] Of State HospMcarthur RoHazle QuantSun City Center Ambulatory Surgery CenterD45W40WilfoAubNorth Alabama Regional HospIllinois Sports Medicine And Orthopedic Surgery Cent<MEASUREMENTDa[Renue Surgery [phone] Ambulatory SMcarthur RoHazle QuantYoung Eye InstituteD70W40WilfoAubPali Momi Medical CePark Cities Surgery Center LLC Dba Park Cities Surgery CenterntNorth<MEASUREMENMidlands [phone] St. JoHazle QuEndoscopy Center Of Santa Monicaa<MEASUREMENTDa[[id number] RoHazle QuantSgt. John L. Levitow Veteran'S Health CenterD43W40WilfoAubDanville PolyclinicSonora Eye Surgery <MEASUREMENTDa[Olive Ambulatory Surgery Center Dba North CampAlba([phone] HospiMcarthur RoHazle QuantSuburban Endoscopy Center LLCD86W40WilfoAubSapling Grove Ambulatory Surgery CenterAmerican Surgisite Center<MEASUREMENTDa[Surgery Center At [facility] Dba Premier Surgery CAlba([phone] RoHazle QuantBradford Place Surgery And Laser CenterLLCD62W40WilfoAubSaint Joseph Hebrew Rehabilitation Center At Dedh<MEASUREMENTDa[Valley Medical [id number] Platte SMcarthur RoHazle QuantAscension Standish Community HospitalD33W40WilfoAubStephens Memorial HospCovenant Hospital Levella<MEASUREMENTDa[Cataract [id number] Surgery Center LLC Dba SurMcarthur RoHazle QuantBunkie General HospitalD64W40WilfoAubCentral Florida Behavioral HospOceans Hospital Of Broussardit<MEASUREMENTDa[[id number] HeaMcarthur RoHazle QuantShriners Hospitals For Children - ErieD88W40WilfoAubOsf Healthcare System Heart Of Mary Medical CeValley Eye Institute As<MEASUREMENTDa[Lake [id number] Mcarthur RoHazle QuantCurahealth Hospital Of TucsonD46W40WilfoAubSarah Bush Lincoln Health CeDuke Triangle Endoscopy Ce<MEASUREMENTDa[Journey Lite [id number] Shore Same Day Surgery Dba North ShorMcarthur RoHazle QuantMcbride Orthopedic HospitalD44W40WilfoAubNorth Central Methodist AsSpalding Endoscopy Center L<MEASUREMENTDa[Aurora [id number] OutMcarthur RoHazle QuantUpstate New York Va Healthcare System (Western Ny Va Healthcare System)D64W40WilfoAubSan Leandro Surgery Center Ltd A California Limited PartnerFairview Southdale Hospital<MEASUREMENTDa[KindredAlba([phone] RichardsMcarthur RoHazle QuantFayetteville Asc LLCD45W40WilfoAubWestern State HospNew Port Nou Surgery Center Ltdit<MEASUREMENTDa[Las Colinas [id number] Surgical AssociatMcarthur RoHazle QuantClaiborne Memorial Medical CenterD85W40WilfoAubMary Hitchcock Memorial HospCentral Louisiana Surgical Hospita<MEASUREMENTDa[Southern [place] Surgicenter LLC Dba [id number] RoHazle QuantMercy Hospital - Mercy Hospital Orchard Park DivisionD31W40WilfoAubThe CentersWest Jefferson Medical Cen<MEASUREMENTDa[Ambulatory Surgery Center Of GreAlba([phone] Hospital FoMcarthur RoHazle QuantOceans Behavioral Hospital Of DeridderD75W40WilfoAubGrace HospSanford Medical Center Farg<MEASUREMENTDa[[id number] HoMcarthur RoHazle QuantDoctors' Community HospitalD32W40WilfoAubTerre Haute Regional HospForbes Ambulatory Surgery Center LLCit<MEASUREMENTDa[Tulsa-Amg [phone] Mcarthur RoHazle QuantTroy Community HospitalD40W40WilfoAubField Memorial Community HospCarepoint Health-Hoboken University Medical Cent<MEASUREMENTDa[Morton [facility] AAlba([phone] Health - Mcarthur RoHazle QuantAuxilio Mutuo HospitalD54W40WilfoAubFullerton Surgery CeSouthern Crescent Endoscopy Suite Pcnt<MEASUREMENTDa[Susan B Allen [phone](Metro HealMcarthur RoHazle QuantMartel Eye Institute LLCD92W40WilfoAubBleckley Memorial HospSheriff Al Cannon Detention Cent<MEASUREMENTDa[Texas Health Seay Behavioral [id number] JosepMcarthur RoHazle QuantG A Endoscopy Center LLCD43W40WilfoAubBaptist Medical Center JacksonvTrinitas Hospital - New Point CampusilNorth<MEASUREMENTDa[Henrico Alba([phone] Mcarthur RoHazle QuantDr John C Corrigan Mental Health CenterD29W40WilfoAubCentral Indiana Orthopedic Surgery CenterStewart Webster Hospita<MEASUREMENTDa[[id number] RoHazle QuantAvera Marshall Reg Med CenterD67W40WilfoAubWellspan Good Samaritan Hospital,Newport Hospit<MEASUREMENTDa[[id number]. Lukes DMcarthur RoHazle QuantMichigan Endoscopy Center At Providence ParkD59W40WilfoAubNovamed Surgery Center Of DenverPacific Endoscopy LLC Dba Atherton Endoscopy Ce<MEASUREMENTDa[Lewis And Clark [id number] St Vincent RegionMcarthur RoHazle QuantEynon Surgery Center LLCD53W40WilfoAubDequincy Memorial HospHuntington V A Medical CenteritCrayneaunaserrator, Civil Serviceoning - held use of PICC line temporarily.  TPN not documented as held.  PICC positioning corrected on imaging at 15:55 post thoracentesis.  3/26 NGT removed, advancing diet.  3/27: spoke to Colleen Kennedy-Smith from GI team, ok to add lipid to TPN  3/28 Travasol on short supply; instructed to use Clinisol instead  4/1 transition to FLD  ASSESSMENT                                                                                                           Today, 01/16/2019:  Glucose (CBG goal 100-150) - well controlled, at goal on sensitive SSI  No insulin needed x 5 days  Electrolytes - Na remains borderline low, Phos cont to be high despite no added phos, all others stable at goal  Renal - SCr, BUN:SCr stable WNL, bicarb improved; UOP adequate  LFTs - all increased,  Tbili now WNL  TGs - down to 153; admitted with TG > 5000; note lipids resumed to TPN 5 days ago.  Goal TG < 150 mg/DL  Prealbumin - low, decreased since admission  Current Nutrition - FLD  IVF -  none  NUTRITIONAL GOALS                                                                                             RD recs (3/26): Kcal:2100-2300 Protein:100-110g Fluid:2.1L/day  PLAN  At 1800 today:  Continue TPN at 60 ml/hr. Had modified TPN on 3/28 and 4/1 to reduce dextrose and lipids in response to rising LFTs. Not clear if TPN is related to transaminitis, although LFTs did begin to rise with initiation of TPN. Hopefull pt will transition to full diet soon and can stop TPN.  New 3-in-1 TPN contains 108 g protein, 180 g dextrose, 36 g lipid, providing 1404 kcal (100% protein goals and 67% of kcal goals)  Add MVI, trace elements and famotidine to TPN (replacing pantoprazole d/t concern for hepatotoxicity)  Electrolytes: cont increased Na, cont no phos; continue others unchanged; Cl:Ac 1:1  IVF per MD (none currently)  BMet with mag and phos in am  Will stop CBGs due to no insulin needed x 5 days  TPN lab panels on Mon and Thurs  Arley Phenix RPh 01/16/2019, 10:40 AM Pager [phone]

## 2019-01-16 NOTE — Procedures (Signed)
PROCEDURE SUMMARY:  Successful image-guided left thoracentesis. Yielded 1.3 liters of dark golden fluid - procedure was aborted due to patient developing symptomatic hypotension during the procedure, residual fluid remains on post procedure ultrasound.   EBL: Zero No immediate complications.  Specimen was sent for labs. Post procedure CXR shows no pneumothorax.  Please see imaging section of Epic for full dictation.  Villa Herb PA-C 01/16/2019 10:37 AM

## 2019-01-16 NOTE — Progress Notes (Signed)
PROGRESS NOTE    Charles Dougherty  ION:[id number] DOB: August 18, 1991 DOA: 12/27/2018 PCP: Patient, No Pcp Per   Brief Narrative:  Patient is a 28 year old male with history of alcohol abuse, substance abuse, smoker who presents to the emergencydepartment with complaints of abdominal pain. Admitted for the management of acute pancreatitis related to alcohol/hypertriglyceridemia.  found to havesevere hypertriglyceridemia. Hospital course remarkable for abdominal discomfort, distention .Abdominal x-ray suggested early bowel obstruction. General surgery consulted and he was started on conservative management with NG tube decompression, n.p.o. status. Unclear if the pancreatitis was precipitated by alcohol or hypertriglyceridemia.Due to severe hypertriglyceridemia,westarted on insulin drip and moved to stepdown. GI also consulted.  Pt admitted for severe pancreatitis 2/2 elevated triglycerides and etoh use.  Stay has been complicated by ileus s/p NG tube, L sided exudative effusion thought 2/2 pancreatitis, and fevers thought 2/2 pneumonia vs pancreatitis.  Currently with improving PO intake, starting to take more clears.  He's on TPN.  GI following.  Assessment & Plan:   Principal Problem:   Acute necrotizing pancreatitis Active Problems:   Acute alcoholic pancreatitis   Pancreatitis   ETOH abuse   Drug abuse (HCC)   Cystitis   Ileus (HCC)   Hypertriglyceridemia   Abdominal pain   SBO (small bowel obstruction) (HCC)   Hypophosphatemia   Hypomagnesemia   Hypocalcemia   AKI (acute kidney injury) (HCC)   Anemia   Fever   Pneumonia of left lower lobe due to infectious organism (HCC)   Abnormal liver enzymes   1 Acute necrotizing pancreatitis secondary to alcohol and hypertriglyceridemia   Ileus Presented 3/13 with elevated lipase and triglycerides >5000 Daily drinker CT from 3/14 with acute edematous interstitial pancreatitis 3/22 CT with evidence of necrotizing pancreatitis.  New  free fluid and phlegmonous/inflammatory material surrounding the pancrease extending into bilateral paracolic gutters and pelvis (see report) PT having BM's.  Tolerating full liquid diet  increased abdominal distension/bloating on 3/26 PM.  Abdominal x ray repeated, suggestive of small bowel ileus removed NGT.  Continue full liquid diet advanced per GI recommendations Repeat CT scan from 3/28 with severe pancreatitis with extensive pseudocyst formation throught the retroperitoneum and upper abdomen.  Biliary sludge in the gallbladder without definite findings to suggest an acute cholecystitis at this time.  Large L and small R pleural effusions. TPN for nutrition until by mouth intake is established, directed by GI Appreciate gastroenterology recommendations, daily electrolytes and triglycerides, continue TPN D/c meropenem.  Morphine prn pain (increased dose due to back pain).  D/c toradol now (s/p 5 days).  #.  Bilateral Pleural Effusions (L>R)   Left lower lobe atelectasis/consolidation (possible pneumonia), persistent fevers Noted on CXR from 3/25 Last fever 3/25 PM Blood and urine cx NGTD Leukocytosis present, but improving Negative urine strep and legionella completed meropenem for possible pneumonia and necrotizing pancreatitis S/p thoracentesis by IR on 3/26, with 640 cc hazy dark amber/tea colored fluid Exudative by light's.  Pleural fluid culture no growth so far, pH 7.5, and cytology (reactive appearing mesothelial cells).  Follow add on amylase (24).  Possibly 2/2 acute pancreatitis? Repeat CXR 3/28 (notable for small L pleural effusion with persistent LLL atelectasis) given effusions on CT 3/27.   CT chest PE protocol on 4/1 showed a large layering left pleural effusion,ordered thoracentesis with labs, probable 0.56, Blood cultures 2 because of persistent fevers  2.  Severe hypertriglyceridemia Patient with family history of hypertriglyceridemia. Patient was placed on the  insulin drip with significant improvement with elevated triglyceride levels.  Repeat tomorrow Will need fibrate on d/c  3.  Small bowel obstruction versus ileus secondary to pancreatitis Patient with 1.1 L output from NG tube over the past 24 hours.   Abdominal films 3/24 with ileus vs SBO, suspect ileus more likely as pt having BM's Continue simethicone 160 mg p.o. 4 times daily.   Patient started on IV Reglan per gastroenterology.  Will change to prn.  CT abdomen/pelvis as noted above Clamp NG and trial clears  6.  Fever/pleuritic chest pain Likely 2/2 above.   Completed abx for possible pneumonia and necrotizing pancreatitis.   S/p thora as noted above Blood and urine cx ngtd. May need repeat imaging (planning for discussion with GI today). Last fever 3/29 Continue to monitor off abx, if recurrent, will need to reculture and consider restarting abx, but he remains hemodynamically stable. Patient also noted to be slightly tachycardic and hypoxic with pleuritic chest pain, will order CT of the chest to rule out PE  7.  Hypophosphatemia/hypokalemia Replace and follow.   8.  Anemia S/p 1 unit on 3/19 and 1 unit on 3/24  Likely dilutional and secondary to worsening pancreatitis.   Patient with no overt bleedheing.   Anemia panel consistent with anemia of chronic disease/iron deficiency anemia.  FOBT negative.   Per GI upper endoscopy to be done in the outpatient setting.  GI following.  9.  Hypocalcemia/hypoalbuminemia Hypocalcemia corrects with albumin Continue to monitor with TPN   10.  Transaminitis   Elevated Bilirubin Images done did not show any gallbladder stones or inflammation.  Patient noted to have diffuse fatty liver.   AST and ALT improving Bili improving Negative acute hepatitis panel  11.  Thrombocytopenia   Thrombocytosis Likely secondary to alcoholic liver disease.  Lovenox discontinued.  No overt bleeding.  Now with elevated platelets.  12.   Suspected cystitis Per CT imaging.  Patient currently asymptomatic.  Urinalysis unremarkable.   13.  Acute kidney injury Resolved with hydration.  Follow.   14.  Hypoglycemic events 2/2 insulin gtt and NPO status. Now on TPN Follow   15 alcohol abuse No signs of withdrawal.  Alcohol cessation stressed to patient.  Social work consulted.  Follow.  16.  Malnutrition Patient presented with acute pancreatitis.  Has essentially been n.p.o. since admission.  Prealbumin of 7.0.  TPN ordered and started the evening of 01/06/2019.  Patient also placed on IV albumin every 6 hours x1 day.  GI following.  # PICC in place, imaging distal PICC tip flipped back and could be entering azygos vein.  Now better positioned.  # Insomnia: benadryl ordered for insomia prn (ambien/restoril have not helped)  # Hypertension: start amlodipine  # Sinus Tachycardia: likely 2/2 above, continue to monitor  DVT prophylaxis: SCD Code Status: full  Family Communication: none at bedside Disposition Plan: pending improvement   Consultants:   GI  IR  Procedures:   none  Antimicrobials:  Anti-infectives (From admission, onward)   Start     Dose/Rate Route Frequency Ordered Stop   01/06/19 1800  meropenem (MERREM) 1 g in sodium chloride 0.9 % 100 mL IVPB  Status:  Discontinued     1 g 200 mL/hr over 30 Minutes Intravenous Every 8 hours 01/06/19 1722 01/11/19 1521   01/05/19 1800  levofloxacin (LEVAQUIN) IVPB 750 mg  Status:  Discontinued     750 mg 100 mL/hr over 90 Minutes Intravenous Every 24 hours 01/05/19 0954 01/05/19 1000   01/05/19 1800  levofloxacin (LEVAQUIN) IVPB  500 mg  Status:  Discontinued     500 mg 100 mL/hr over 60 Minutes Intravenous Every 24 hours 01/05/19 1012 01/05/19 1041   01/05/19 1130  levofloxacin (LEVAQUIN) IVPB 750 mg  Status:  Discontinued     750 mg 100 mL/hr over 90 Minutes Intravenous Daily 01/05/19 1041 01/06/19 1715   01/04/19 1800  levofloxacin (LEVAQUIN) IVPB  500 mg  Status:  Discontinued     500 mg 100 mL/hr over 60 Minutes Intravenous Every 24 hours 01/04/19 1612 01/05/19 0954     Subjective: Patient is feeling okay, noted to have fever of 100.7 last night, denies any significant change in his pulmonary symptoms, ambulating the halls, tolerating full liquids  Objective: Vitals:   01/15/19 2303 01/16/19 0005 01/16/19 0106 01/16/19 0525  BP: 127/81 126/86 127/87 138/87  Pulse: (!) 119 (!) 116 (!) 116 (!) 109  Resp: (!) 26 (!) 30 (!) 27 (!) 22  Temp: (!) 100.7 F (38.2 C) 99.2 F (37.3 C) 99.1 F (37.3 C) 98.7 F (37.1 C)  TempSrc: Oral Oral Oral Oral  SpO2: 93% 95% 94% 95%  Weight:    75.9 kg  Height:        Intake/Output Summary (Last 24 hours) at 01/16/2019 1042 Last data filed at 01/16/2019 0349 Gross per 24 hour  Intake 720 ml  Output 200 ml  Net 520 ml   Filed Weights   01/10/19 2052 01/14/19 0500 01/16/19 0525  Weight: 75.5 kg 73.9 kg 75.9 kg    Examination:  General: No acute distress. Cardiovascular: Heart sounds show a regular rate, and rhythm. Lungs: Clear to auscultation bilaterally. Abdomen: Soft, nontender, distended Neurological: Alert and oriented 3. Moves all extremities 4. Cranial nerves II through XII grossly intact. Skin: Warm and dry. No rashes or lesions. Extremities: No clubbing or cyanosis. No edema. Psychiatric: Mood and affect are normal. Insight and judgment are appropriate.   Data Reviewed: I have personally reviewed following labs and imaging studies  CBC: Recent Labs  Lab 01/11/19 0426 01/12/19 0407 01/13/19 0314 01/14/19 0433 01/15/19 0351  WBC 12.7* 13.1* 12.7* 11.8* 10.6*  NEUTROABS  --   --  9.9*  --   --   HGB 8.4* 9.0* 8.7* 8.4* 8.2*  HCT 27.5* 28.5* 28.6* 26.9* 27.5*  MCV 101.5* 100.4* 99.0 97.1 99.3  PLT 657* 660* 611* 526* 512*   Basic Metabolic Panel: Recent Labs  Lab 01/12/19 0407 01/13/19 0314 01/14/19 0433 01/15/19 0351 01/16/19 0324  NA 133* 133* 132* 134*  134*  K 4.1 4.3 4.1 3.9 4.0  CL 100 101 100 100 102  CO2 22 23 24 23 24   GLUCOSE 102* 105* 111* 107* 99  BUN 16 18 19 17 15   CREATININE 0.62 0.64 0.55* 0.59* 0.52*  CALCIUM 8.2* 8.4* 8.1* 8.0* 8.0*  MG 2.1 2.3 2.1 2.3 2.1  PHOS 5.1* 4.8* 4.9* 4.8* 5.0*   GFR: Estimated Creatinine Clearance: 145.7 mL/min (A) (by C-G formula based on SCr of 0.52 mg/dL (L)). Liver Function Tests: Recent Labs  Lab 01/12/19 0407 01/13/19 0314 01/14/19 0433 01/15/19 0351 01/16/19 0324  AST 97* 85* 66* 88* 86*  ALT 161* 148* 118* 131* 129*  ALKPHOS 117 126 146* 175* 186*  BILITOT 1.8* 2.2* 1.6* 1.5* 1.1  PROT 5.9* 6.1* 5.8* 5.9* 5.4*  ALBUMIN 2.5* 2.5* 2.4* 2.3* 2.2*   No results for input(s): LIPASE, AMYLASE in the last 168 hours. No results for input(s): AMMONIA in the last 168 hours. Coagulation Profile: No results  for input(s): INR, PROTIME in the last 168 hours. Cardiac Enzymes: No results for input(s): CKTOTAL, CKMB, CKMBINDEX, TROPONINI in the last 168 hours. BNP (last 3 results) No results for input(s): PROBNP in the last 8760 hours. HbA1C: No results for input(s): HGBA1C in the last 72 hours. CBG: Recent Labs  Lab 01/14/19 2131 01/15/19 0549 01/15/19 1138 01/15/19 1648 01/16/19 0615  GLUCAP 111* 105* 115* 127* 94   Lipid Profile: Recent Labs    01/15/19 0351 01/16/19 0324  TRIG 177* 153*   Thyroid Function Tests: No results for input(s): TSH, T4TOTAL, FREET4, T3FREE, THYROIDAB in the last 72 hours. Anemia Panel: No results for input(s): VITAMINB12, FOLATE, FERRITIN, TIBC, IRON, RETICCTPCT in the last 72 hours. Sepsis Labs: Recent Labs  Lab 01/16/19 0855  PROCALCITON 0.56    Recent Results (from the past 240 hour(s))  Culture, body fluid-bottle     Status: None   Collection Time: 01/08/19  3:48 PM  Result Value Ref Range Status   Specimen Description PLEURAL  Final   Special Requests NONE  Final   Culture   Final    NO GROWTH 5 DAYS Performed at [facility], [address] [place], [place] [address]    Report Status 01/13/2019 FINAL  Final  Gram stain     Status: None   Collection Time: 01/08/19  3:48 PM  Result Value Ref Range Status   Specimen Description PLEURAL  Final   Special Requests NONE  Final   Gram Stain   Final    MODERATE WBC PRESENT, PREDOMINANTLY PMN NO ORGANISMS SEEN Performed at [facility], [address], [place], [place] [address]    Report Status 01/08/2019 FINAL  Final         Radiology Studies: Ct Angio Chest Pe W Or Wo Contrast  Result Date: 01/15/2019 CLINICAL DATA:  28 year old male with left-sided chest pain history of left exudative pleural effusion thought secondary to recent pancreatitis. EXAM: CT ANGIOGRAPHY CHEST WITH CONTRAST TECHNIQUE: Multidetector CT imaging of the chest was performed using the standard protocol during bolus administration of intravenous contrast. Multiplanar CT image reconstructions and MIPs were obtained to evaluate the vascular anatomy. CONTRAST:  OMNIPAQUE IOHEXOL 350 MG/ML SOLN COMPARISON:  Prior CT scan of the abdomen and pelvis 01/10/2019 FINDINGS: Cardiovascular: Satisfactory opacification of the pulmonary arteries to the segmental level. No evidence of pulmonary embolism. Normal heart size. No pericardial effusion. Left upper extremity approach PICC. Catheter tip terminates in the mid SVC. Mediastinum/Nodes: No enlarged mediastinal, hilar, or axillary lymph nodes. Thyroid gland, trachea, and esophagus demonstrate no significant findings. Lungs/Pleura: Large layering left pleural effusion without evidence of pleural thickening or enhancement. There is associated atelectasis of the left lower lobe. Linear atelectasis in the right lower lobe as well. No focal airspace consolidation, pulmonary edema or pneumothorax. Upper Abdomen: Incompletely imaged perigastric fluid collection, likely related to prior pancreatitis. No new or acute abnormality within the upper  abdomen. Musculoskeletal: No acute fracture or aggressive appearing lytic or blastic osseous lesion. Review of the MIP images confirms the above findings. IMPRESSION: 1. Large but simple appearing layering left pleural effusion with associated left lower lobe atelectasis. 2. Segmental right lower lobe atelectasis. 3. Well-positioned left upper extremity approach PICC. Electronically Signed   By: Malachy Moan M.D.   On: 01/15/2019 15:07        Scheduled Meds:  amLODipine  5 mg Oral Daily   feeding supplement (ENSURE ENLIVE)  237 mL Oral BID  BM   insulin aspart  0-9 Units Subcutaneous Q8H   lidocaine       metoCLOPramide (REGLAN) injection  5 mg Intravenous Q6H   nicotine  14 mg Transdermal Daily   polyethylene glycol  17 g Oral BID AC   simethicone  160 mg Oral QID   sodium chloride flush  10-40 mL Intracatheter Q12H   Continuous Infusions:  TPN ADULT (ION) 60 mL/hr at 01/15/19 1736     LOS: 19 days    Time spent: over 30 min    Richarda OverlieNayana Azzie Thiem, MD [facility] Pager AMION  If 7PM-7AM, please contact night-coverage [email or web address] Password [email or web address] [email or web address] 01/16/2019, 10:42 AM

## 2019-01-16 NOTE — Progress Notes (Addendum)
[facility] Progress Note   Chief Complaint:   pancreatitis    SUBJECTIVE:    had left thoracentesis an hour ago. Having significant left chest pain with any movement or deep breaths. Otherwise he has been tolerating Ensures. Having BMs.   ASSESSMENT AND PLAN:   1. 28 yo male with severe necrotizing pancreatitis ( Etoh + hypertriglyceridemia) with extensive pseudocyst formation, large left pleural effusion, and biliary sludge on most recent CT scan 01/09/19. Hospital course has been further complicated by elevated liver tests,  ileus ( resolving) and anemia. -Improving. No significant abdominal pain. NGT out now. Tolerating some Ensure. Continue TNA for now.  -Tmas 101.3. WBC continues to head toward normalization -hgb has drifted slowly over last few days from 8.7 >>> 8.4 >>>8.2 yesterday. Will recheck tomorrow. Monitor for now. -Liver tests improving with exception of alk phos ( etiology?), would think too soon to be related to TNA  2. Chest pain. He is s/p left thoracentesis an hour ago, 1.3 liters removed. Post procedure CXR showed decreased effusion. No pneumo noted. I did touch base with Raynald Blend. in IR. He was having chest pain even prior to procedure but if persists then will repeat CXR. Of note, CTA chest done yesterday given sinus tachycardia / chest pain - other than left effusion it was unremarkable.    OBJECTIVE:     Vital signs in last 24 hours: Temp:  [98.4 F (36.9 C)-101.3 F (38.5 C)] 98.7 F (37.1 C) (04/02 0525) Pulse Rate:  [109-138] 109 (04/02 0525) Resp:  [18-34] 22 (04/02 0525) BP: (89-143)/(44-98) 89/44 (04/02 1034) SpO2:  [88 %-96 %] 95 % (04/02 0525) Weight:  [75.9 kg] 75.9 kg (04/02 0525) Last BM Date: 01/15/19 General:   Alert, well-developed male in NAD EENT:  Normal hearing, non icteric sclera, conjunctive pink.  Heart:  Regular rate and rhythm; no murmur.  No lower extremity edema   Pulm: Normal respiratory effort,  lungs.Decreased left breath sounds but otherwise unremarkable.  Abdomen:  Soft, nondistended, nontender.  Normal bowel sounds, no masses felt.       Neurologic:  Alert and  oriented x4;  grossly normal neurologically. Psych:  Pleasant, cooperative.  Normal mood and affect.   Intake/Output from previous day: 04/01 0701 - 04/02 0700 In: 720 [I.V.:720] Out: 200 [Urine:200] Intake/Output this shift: No intake/output data recorded.  Lab Results: Recent Labs    01/14/19 0433 01/15/19 0351  WBC 11.8* 10.6*  HGB 8.4* 8.2*  HCT 26.9* 27.5*  PLT 526* 512*   BMET Recent Labs    01/14/19 0433 01/15/19 0351 01/16/19 0324  NA 132* 134* 134*  K 4.1 3.9 4.0  CL 100 100 102  CO2 '24 23 24  ' GLUCOSE 111* 107* 99  BUN '19 17 15  ' CREATININE 0.55* 0.59* 0.52*  CALCIUM 8.1* 8.0* 8.0*   LFT Recent Labs    01/14/19 0433  01/16/19 0324  PROT 5.8*   < > 5.4*  ALBUMIN 2.4*   < > 2.2*  AST 66*   < > 86*  ALT 118*   < > 129*  ALKPHOS 146*   < > 186*  BILITOT 1.6*   < > 1.1  BILIDIR 1.2*  --   --   IBILI 0.4  --   --    < > = values in this interval not displayed.   PT/INR No results for input(s): LABPROT, INR in the last 72 hours. Hepatitis Panel No results for input(s): HEPBSAG, HCVAB,  HEPAIGM, HEPBIGM in the last 72 hours.  Dg Chest 1 View  Result Date: 01/16/2019 CLINICAL DATA:  Post left thoracentesis EXAM: CHEST  1 VIEW COMPARISON:  CT 01/15/2019 FINDINGS: Small left pleural effusion remains following thoracentesis. No pneumothorax. Left PICC line is in place with the tip at the cavoatrial junction. Left base atelectasis. Right perihilar atelectasis. Heart is normal size. IMPRESSION: Decreasing left effusion following thoracentesis with small residual left effusion. No pneumothorax. Right perihilar and left basilar atelectasis. Electronically Signed   By: Rolm Baptise M.D.   On: 01/16/2019 10:50   Ct Angio Chest Pe W Or Wo Contrast  Result Date: 01/15/2019 CLINICAL DATA:   28 year old male with left-sided chest pain history of left exudative pleural effusion thought secondary to recent pancreatitis. EXAM: CT ANGIOGRAPHY CHEST WITH CONTRAST TECHNIQUE: Multidetector CT imaging of the chest was performed using the standard protocol during bolus administration of intravenous contrast. Multiplanar CT image reconstructions and MIPs were obtained to evaluate the vascular anatomy. CONTRAST:  132m OMNIPAQUE IOHEXOL 350 MG/ML SOLN COMPARISON:  Prior CT scan of the abdomen and pelvis 01/10/2019 FINDINGS: Cardiovascular: Satisfactory opacification of the pulmonary arteries to the segmental level. No evidence of pulmonary embolism. Normal heart size. No pericardial effusion. Left upper extremity approach PICC. Catheter tip terminates in the mid SVC. Mediastinum/Nodes: No enlarged mediastinal, hilar, or axillary lymph nodes. Thyroid gland, trachea, and esophagus demonstrate no significant findings. Lungs/Pleura: Large layering left pleural effusion without evidence of pleural thickening or enhancement. There is associated atelectasis of the left lower lobe. Linear atelectasis in the right lower lobe as well. No focal airspace consolidation, pulmonary edema or pneumothorax. Upper Abdomen: Incompletely imaged perigastric fluid collection, likely related to prior pancreatitis. No new or acute abnormality within the upper abdomen. Musculoskeletal: No acute fracture or aggressive appearing lytic or blastic osseous lesion. Review of the MIP images confirms the above findings. IMPRESSION: 1. Large but simple appearing layering left pleural effusion with associated left lower lobe atelectasis. 2. Segmental right lower lobe atelectasis. 3. Well-positioned left upper extremity approach PICC. Electronically Signed   By: HJacqulynn CadetM.D.   On: 01/15/2019 15:07      ________________________________________________________________________  LVelora HecklerGI MD note:  I personally examined the patient,  reviewed the data and agree with the assessment and plan described above.  He is tolerating full liquids and ensures from GI standpoint.  Biggest issue today is pretty intense pleuritic chest pains since thoracentesis this morning. I spoke with SCandiss Norsefrom radiology and she plans to evaluate him this afternoon, likely repeat CXR first.   DOwens Loffler MD [facility]orton [facility] HospitalG[facility]enter[facility] Pager [phone]

## 2019-01-17 LAB — PROCALCITONIN: Procalcitonin: 0.4 ng/mL

## 2019-01-17 LAB — BASIC METABOLIC PANEL
Anion gap: 8 (ref 5–15)
BUN: 16 mg/dL (ref 6–20)
CO2: 25 mmol/L (ref 22–32)
Calcium: 8.1 mg/dL — ABNORMAL LOW (ref 8.9–10.3)
Chloride: 103 mmol/L (ref 98–111)
Creatinine, Ser: 0.5 mg/dL — ABNORMAL LOW (ref 0.61–1.24)
GFR calc Af Amer: >60 mL/min (ref >60)
GFR calc non Af Amer: >60 mL/min (ref >60)
Glucose, Bld: 114 mg/dL — ABNORMAL HIGH (ref 70–99)
Potassium: 4.5 mmol/L (ref 3.5–5.1)
Sodium: 136 mmol/L (ref 135–145)

## 2019-01-17 LAB — PHOSPHORUS: Phosphorus: 4.4 mg/dL (ref 2.5–4.6)

## 2019-01-17 LAB — GLUCOSE, CAPILLARY
Glucose-Capillary: 113 mg/dL — ABNORMAL HIGH (ref 70–99)
Glucose-Capillary: 99 mg/dL (ref 70–99)

## 2019-01-17 LAB — TRIGLYCERIDES: Triglycerides: 173 mg/dL — ABNORMAL HIGH (ref <150)

## 2019-01-17 LAB — MAGNESIUM: Magnesium: 2.1 mg/dL (ref 1.7–2.4)

## 2019-01-17 LAB — PH, BODY FLUID: pH, Body Fluid: 7.5

## 2019-01-17 LAB — TRIGLYCERIDES, BODY FLUIDS: Triglycerides, Fluid: 54 mg/dL

## 2019-01-17 MED ORDER — TRAVASOL 10 % IV SOLN
INTRAVENOUS | Status: DC
  Administered 2019-01-17: 18:00:00 via INTRAVENOUS
  Filled 2019-01-17: qty 702

## 2019-01-17 MED ORDER — ENSURE ENLIVE PO LIQD
237.0000 mL | Freq: Three times a day (TID) | ORAL | Status: DC
  Administered 2019-01-17 – 2019-01-19 (×7): 237 mL via ORAL

## 2019-01-17 NOTE — Progress Notes (Signed)
PROGRESS NOTE    Jayzen Paver  ZOX:[id number] DOB: Aug 01, 1991 DOA: 12/27/2018 PCP: Patient, No Pcp Per   Brief Narrative:  Patient is a 28 year old male with history of alcohol abuse, substance abuse, smoker who presents to the emergencydepartment with complaints of abdominal pain. Admitted for the management of acute pancreatitis related to alcohol/hypertriglyceridemia.  found to havesevere hypertriglyceridemia. Hospital course remarkable for abdominal discomfort, distention .Abdominal x-ray suggested early bowel obstruction. General surgery consulted and he was started on conservative management with NG tube decompression, n.p.o. status. Unclear if the pancreatitis was precipitated by alcohol or hypertriglyceridemia.Due to severe hypertriglyceridemia,westarted on insulin drip and moved to stepdown. GI also consulted.  Pt admitted for severe pancreatitis 2/2 elevated triglycerides and etoh use.  Stay has been complicated by ileus s/p NG tube, L sided exudative effusion thought 2/2 pancreatitis, and fevers thought 2/2 pneumonia vs pancreatitis.  Currently with improving PO intake, starting to take more by mouth.  He's on TPN.  GI following.  Assessment & Plan:   Principal Problem:   Acute necrotizing pancreatitis Active Problems:   Acute alcoholic pancreatitis   Pancreatitis   ETOH abuse   Drug abuse (HCC)   Cystitis   Ileus (HCC)   Hypertriglyceridemia   Abdominal pain   SBO (small bowel obstruction) (HCC)   Hypophosphatemia   Hypomagnesemia   Hypocalcemia   AKI (acute kidney injury) (HCC)   Anemia   Fever   Pneumonia of left lower lobe due to infectious organism (HCC)   Abnormal liver enzymes   1 Acute necrotizing pancreatitis secondary to alcohol and hypertriglyceridemia   Ileus Presented 3/13 with elevated lipase and triglycerides >5000 Daily drinker CT from 3/14 with acute edematous interstitial pancreatitis 3/22 CT with evidence of necrotizing pancreatitis.   New free fluid and phlegmonous/inflammatory material surrounding the pancrease extending into bilateral paracolic gutters and pelvis (see report) PT having BM's.  Tolerating full liquid diet  increased abdominal distension/bloating on 3/26 PM.  Abdominal x ray repeated, suggestive of small bowel ileus Ileus improving, removed NGT  4/1.  Continue full liquid diet advanced per GI recommendations Repeat CT scan from 3/28 with severe pancreatitis with extensive pseudocyst formation throught the retroperitoneum and upper abdomen.  Biliary sludge in the gallbladder without definite findings to suggest an acute cholecystitis at this time.  Large L and small R pleural effusions. TPN for nutrition until by mouth intake is established, directed by GI Appreciate gastroenterology recommendations, daily electrolytes and triglycerides, continue TPN D/c meropenem.  Morphine prn pain (increased dose due to back pain).  D/c toradol now (s/p 5 days).  #.  Bilateral Pleural Effusions (L>R)   Left lower lobe atelectasis/consolidation (possible pneumonia), persistent fevers Noted on CXR from 3/25 Blood and urine cx NGTD Leukocytosis present, but improving Negative urine strep and legionella completed meropenem for possible pneumonia and necrotizing pancreatitis S/p thoracentesis by IR on 3/26, with 640 cc hazy dark amber/tea colored fluid Exudative by light's.  Pleural fluid culture no growth so far, pH 7.5, and cytology (reactive appearing mesothelial cells).  Follow add on amylase (24).  Possibly 2/2 acute pancreatitis? Repeat CXR 3/28 (notable for small L pleural effusion with persistent LLL atelectasis) given effusions on CT 3/27.   CT chest PE protocol on 4/1 showed a large layering left pleural effusion, s/p thoracentesis with similar labs, procalcitonin 0.56, chest pain post thoracentesis improving with Toradol and ibuprofen, no pneumothorax Blood cultures 2,  4/2 because of persistent fevers  2.  Severe  hypertriglyceridemia Patient with family history  of hypertriglyceridemia. Patient was placed on the insulin drip with significant improvement with elevated triglyceride levels.   Repeat tomorrow Will need fibrate on d/c  3.  Small bowel obstruction versus ileus secondary to pancreatitis Abdominal films 3/24 with ileus vs SBO, suspect ileus more likely as pt having BM's Continue simethicone 160 mg p.o. 4 times daily.   Patient started on scheduled IV Reglan per gastroenterology.   CT abdomen/pelvis as noted above Advance diet as tolerated per GI, anticipate he will discharge in the next 24 to 48 hours  6.  Fever/pleuritic chest pain Pleuritic chest pain likely secondary to thoracentesis/pancreatitis/sympathetic effusion.   Completed abx for possible pneumonia and necrotizing pancreatitis.   S/p thora x2 Blood and urine cx ngtd. Continue to monitor off abx, if recurrent, will need to reculture and consider restarting abx, but he remains hemodynamically stable. Patient continues to be tachycardic but afebrile in the last 24 hours  7.  Hypophosphatemia/hypokalemia Replace and follow.   8.  Anemia S/p 1 unit on 3/19 and 1 unit on 3/24  Likely dilutional and secondary to worsening pancreatitis.   Patient with no overt bleedheing.   Anemia panel consistent with anemia of chronic disease/iron deficiency anemia. Hg 8.2  FOBT negative.   Per GI upper endoscopy to be done in the outpatient setting.  GI following.   9.  Hypocalcemia/hypoalbuminemia Hypocalcemia corrects with albumin Continue to monitor with TPN   10.  Transaminitis   Elevated Bilirubin Images done did not show any gallbladder stones or inflammation.  Patient noted to have diffuse fatty liver.   AST and ALT improving Bili improving Negative acute hepatitis panel  11.  Thrombocytopenia   Thrombocytosis Likely secondary to alcoholic liver disease.  Lovenox discontinued.  No overt bleeding.  Now with elevated  platelets.  12.  Suspected cystitis Per CT imaging.  Patient currently asymptomatic.  Urinalysis unremarkable.   13.  Acute kidney injury Resolved with hydration.  Follow.   14.  Hypoglycemic events 2/2 insulin gtt and NPO status. Now on TPN Follow   15 alcohol abuse No signs of withdrawal.  Alcohol cessation stressed to patient.  Social work consulted.  Follow.  16.  Malnutrition Patient presented with acute pancreatitis.  Has essentially been n.p.o. since admission.  Prealbumin of 7.0.  TPN ordered and started the evening of 01/06/2019.  Patient also placed on IV albumin every 6 hours x1 day.  GI following.  # PICC in place, imaging distal PICC tip flipped back and could be entering azygos vein.  Now better positioned.  # Insomnia: benadryl ordered for insomia prn (ambien/restoril have not helped)  # Hypertension: start amlodipine  # Sinus Tachycardia: likely 2/2 above, continue to monitor   DVT prophylaxis: SCD Code Status: full  Family Communication: none at bedside Disposition Plan: pending improvement   Consultants:   GI  IR  Procedures:   none  Antimicrobials:  Anti-infectives (From admission, onward)   Start     Dose/Rate Route Frequency Ordered Stop   01/06/19 1800  meropenem (MERREM) 1 g in sodium chloride 0.9 % 100 mL IVPB  Status:  Discontinued     1 g 200 mL/hr over 30 Minutes Intravenous Every 8 hours 01/06/19 1722 01/11/19 1521   01/05/19 1800  levofloxacin (LEVAQUIN) IVPB 750 mg  Status:  Discontinued     750 mg 100 mL/hr over 90 Minutes Intravenous Every 24 hours 01/05/19 0954 01/05/19 1000   01/05/19 1800  levofloxacin (LEVAQUIN) IVPB 500 mg  Status:  Discontinued     500 mg 100 mL/hr over 60 Minutes Intravenous Every 24 hours 01/05/19 1012 01/05/19 1041   01/05/19 1130  levofloxacin (LEVAQUIN) IVPB 750 mg  Status:  Discontinued     750 mg 100 mL/hr over 90 Minutes Intravenous Daily 01/05/19 1041 01/06/19 1715   01/04/19 1800   levofloxacin (LEVAQUIN) IVPB 500 mg  Status:  Discontinued     500 mg 100 mL/hr over 60 Minutes Intravenous Every 24 hours 01/04/19 1612 01/05/19 0954     Subjective: No fever,  Chest pain after thora improving , denies any significant change in his pulmonary symptoms, ambulating the halls, tolerating full liquids  Objective: Vitals:   01/16/19 1034 01/16/19 1255 01/16/19 2020 01/17/19 0451  BP: (!) 89/44 118/79 (!) 132/91 125/78  Pulse:   (!) 116 (!) 121  Resp:   18 16  Temp:   98.1 F (36.7 C) 98.3 F (36.8 C)  TempSrc:   Oral Oral  SpO2:   96% 96%  Weight:      Height:        Intake/Output Summary (Last 24 hours) at 01/17/2019 1320 Last data filed at 01/17/2019 0600 Gross per 24 hour  Intake 720 ml  Output 1 ml  Net 719 ml   Filed Weights   01/10/19 2052 01/14/19 0500 01/16/19 0525  Weight: 75.5 kg 73.9 kg 75.9 kg    Examination:  General: No acute distress. Cardiovascular: Heart sounds show a regular rate, and rhythm. Lungs: Clear to auscultation bilaterally. Abdomen: Soft, nontender, distended Neurological: Alert and oriented 3. Moves all extremities 4. Cranial nerves II through XII grossly intact. Skin: Warm and dry. No rashes or lesions. Extremities: No clubbing or cyanosis. No edema. Psychiatric: Mood and affect are normal. Insight and judgment are appropriate.   Data Reviewed: I have personally reviewed following labs and imaging studies  CBC: Recent Labs  Lab 01/11/19 0426 01/12/19 0407 01/13/19 0314 01/14/19 0433 01/15/19 0351  WBC 12.7* 13.1* 12.7* 11.8* 10.6*  NEUTROABS  --   --  9.9*  --   --   HGB 8.4* 9.0* 8.7* 8.4* 8.2*  HCT 27.5* 28.5* 28.6* 26.9* 27.5*  MCV 101.5* 100.4* 99.0 97.1 99.3  PLT 657* 660* 611* 526* 512*   Basic Metabolic Panel: Recent Labs  Lab 01/13/19 0314 01/14/19 0433 01/15/19 0351 01/16/19 0324 01/17/19 0435  NA 133* 132* 134* 134* 136  K 4.3 4.1 3.9 4.0 4.5  CL 101 100 100 102 103  CO2 23 24 23 24 25     GLUCOSE 105* 111* 107* 99 114*  BUN 18 19 17 15 16   CREATININE 0.64 0.55* 0.59* 0.52* 0.50*  CALCIUM 8.4* 8.1* 8.0* 8.0* 8.1*  MG 2.3 2.1 2.3 2.1 2.1  PHOS 4.8* 4.9* 4.8* 5.0* 4.4   GFR: Estimated Creatinine Clearance: 145.7 mL/min (A) (by C-G formula based on SCr of 0.5 mg/dL (L)). Liver Function Tests: Recent Labs  Lab 01/12/19 0407 01/13/19 0314 01/14/19 0433 01/15/19 0351 01/16/19 0324  AST 97* 85* 66* 88* 86*  ALT 161* 148* 118* 131* 129*  ALKPHOS 117 126 146* 175* 186*  BILITOT 1.8* 2.2* 1.6* 1.5* 1.1  PROT 5.9* 6.1* 5.8* 5.9* 5.4*  ALBUMIN 2.5* 2.5* 2.4* 2.3* 2.2*   No results for input(s): LIPASE, AMYLASE in the last 168 hours. No results for input(s): AMMONIA in the last 168 hours. Coagulation Profile: No results for input(s): INR, PROTIME in the last 168 hours. Cardiac Enzymes: No results for input(s): CKTOTAL, CKMB, CKMBINDEX, TROPONINI  in the last 168 hours. BNP (last 3 results) No results for input(s): PROBNP in the last 8760 hours. HbA1C: No results for input(s): HGBA1C in the last 72 hours. CBG: Recent Labs  Lab 01/15/19 0549 01/15/19 1138 01/15/19 1648 01/16/19 0615 01/17/19 0412  GLUCAP 105* 115* 127* 94 113*   Lipid Profile: Recent Labs    01/16/19 0324 01/17/19 0435  TRIG 153* 173*   Thyroid Function Tests: No results for input(s): TSH, T4TOTAL, FREET4, T3FREE, THYROIDAB in the last 72 hours. Anemia Panel: No results for input(s): VITAMINB12, FOLATE, FERRITIN, TIBC, IRON, RETICCTPCT in the last 72 hours. Sepsis Labs: Recent Labs  Lab 01/16/19 0855 01/17/19 0435  PROCALCITON 0.56 0.40    Recent Results (from the past 240 hour(s))  Culture, body fluid-bottle     Status: None   Collection Time: 01/08/19  3:48 PM  Result Value Ref Range Status   Specimen Description PLEURAL  Final   Special Requests NONE  Final   Culture   Final    NO GROWTH 5 DAYS Performed at [facility], [address] [place], [place] [address]     Report Status 01/13/2019 FINAL  Final  Gram stain     Status: None   Collection Time: 01/08/19  3:48 PM  Result Value Ref Range Status   Specimen Description PLEURAL  Final   Special Requests NONE  Final   Gram Stain   Final    MODERATE WBC PRESENT, PREDOMINANTLY PMN NO ORGANISMS SEEN Performed at [facility], [address] [place], [place] [address]    Report Status 01/08/2019 FINAL  Final  Culture, blood (routine x 2)     Status: None (Preliminary result)   Collection Time: 01/16/19  8:55 AM  Result Value Ref Range Status   Specimen Description   Final    BLOOD RIGHT HAND Performed at [facility], [address], [place], [place] [address]    Special Requests   Final    BOTTLES DRAWN AEROBIC ONLY Blood Culture adequate volume Performed at [facility], [address], [place], [place] [address]    Culture   Final    NO GROWTH 1 DAY Performed at [facility], [address], [place], [place] [address]    Report Status PENDING  Incomplete  Culture, blood (routine x 2)     Status: None (Preliminary result)   Collection Time: 01/16/19  8:55 AM  Result Value Ref Range Status   Specimen Description   Final    BLOOD RIGHT HAND Performed at [facility], [address], [place], [place] [address]    Special Requests   Final    BOTTLES DRAWN AEROBIC AND ANAEROBIC Blood Culture adequate volume Performed at [facility], [address], [place], [place] [address]    Culture   Final    NO GROWTH 1 DAY Performed at [facility], [address], [place], [place] [address]    Report Status PENDING  Incomplete  Body fluid culture     Status: None (Preliminary result)   Collection Time: 01/16/19 11:09 AM  Result Value Ref Range Status   Specimen Description   Final    PLEURAL LEFT Performed at [facility], [address], [place], [place] [address]    Special  Requests   Final    NONE Performed at [facility], [address] [place], [place] [address]  Gram Stain FEW WBC SEEN NO ORGANISMS SEEN   Final   Culture   Final    NO GROWTH < 24 HOURS Performed at [facility], [address] [place], [place] [address]    Report Status PENDING  Incomplete         Radiology Studies: Dg Chest 1 View  Result Date: 01/16/2019 CLINICAL DATA:  Post left thoracentesis EXAM: CHEST  1 VIEW COMPARISON:  CT 01/15/2019 FINDINGS: Small left pleural effusion remains following thoracentesis. No pneumothorax. Left PICC line is in place with the tip at the cavoatrial junction. Left base atelectasis. Right perihilar atelectasis. Heart is normal size. IMPRESSION: Decreasing left effusion following thoracentesis with small residual left effusion. No pneumothorax. Right perihilar and left basilar atelectasis. Electronically Signed   By: Charlett Nose M.D.   On: 01/16/2019 10:50   Ct Angio Chest Pe W Or Wo Contrast  Result Date: 01/15/2019 CLINICAL DATA:  28 year old male with left-sided chest pain history of left exudative pleural effusion thought secondary to recent pancreatitis. EXAM: CT ANGIOGRAPHY CHEST WITH CONTRAST TECHNIQUE: Multidetector CT imaging of the chest was performed using the standard protocol during bolus administration of intravenous contrast. Multiplanar CT image reconstructions and MIPs were obtained to evaluate the vascular anatomy. CONTRAST:  OMNIPAQUE IOHEXOL 350 MG/ML SOLN COMPARISON:  Prior CT scan of the abdomen and pelvis 01/10/2019 FINDINGS: Cardiovascular: Satisfactory opacification of the pulmonary arteries to the segmental level. No evidence of pulmonary embolism. Normal heart size. No pericardial effusion. Left upper extremity approach PICC. Catheter tip terminates in the mid SVC. Mediastinum/Nodes: No enlarged mediastinal, hilar, or axillary lymph nodes. Thyroid gland, trachea, and esophagus demonstrate no  significant findings. Lungs/Pleura: Large layering left pleural effusion without evidence of pleural thickening or enhancement. There is associated atelectasis of the left lower lobe. Linear atelectasis in the right lower lobe as well. No focal airspace consolidation, pulmonary edema or pneumothorax. Upper Abdomen: Incompletely imaged perigastric fluid collection, likely related to prior pancreatitis. No new or acute abnormality within the upper abdomen. Musculoskeletal: No acute fracture or aggressive appearing lytic or blastic osseous lesion. Review of the MIP images confirms the above findings. IMPRESSION: 1. Large but simple appearing layering left pleural effusion with associated left lower lobe atelectasis. 2. Segmental right lower lobe atelectasis. 3. Well-positioned left upper extremity approach PICC. Electronically Signed   By: Malachy Moan M.D.   On: 01/15/2019 15:07   Dg Chest Port 1 View  Result Date: 01/16/2019 CLINICAL DATA:  Left-sided chest pain. Patient underwent thoracentesis today. EXAM: PORTABLE CHEST 1 VIEW COMPARISON:  01/16/2019 at 10:48 a.m. FINDINGS: No change from the earlier exam.  No pneumothorax. Persistent left lung base opacity consistent with a combination of a small residual effusion with either atelectasis or pneumonia. Mild atelectasis extending laterally and inferiorly from the right hilum is also unchanged. No new lung abnormalities. Stable left sided PICC. IMPRESSION: 1. No change from the earlier exam.  No pneumothorax. Electronically Signed   By: Amie Portland M.D.   On: 01/16/2019 13:35   US Thoracentesis Asp Pleural Space W/img Guide  Result Date: 01/16/2019 INDICATION: Patient with acute necrotizing pancreatitis secondary to ETOH abuse and severe hypertriglyceridemia with left sided chest pain, dyspnea with exertion and fever. Imaging shows recurrent left pleural effusion. Request for diagnostic and therapeutic thoracentesis today in IR. EXAM: ULTRASOUND GUIDED  LEFT THORACENTESIS MEDICATIONS: 10 mL 1% lidocaine COMPLICATIONS: None immediate. PROCEDURE: An ultrasound guided thoracentesis was thoroughly discussed with the patient and questions answered.  The benefits, risks, alternatives and complications were also discussed. The patient understands and wishes to proceed with the procedure. Written consent was obtained. Ultrasound was performed to localize and mark an adequate pocket of fluid in the left chest. The area was then prepped and draped in the normal sterile fashion. 1% Lidocaine was used for local anesthesia. Under ultrasound guidance a 6 Fr Safe-T-Centesis catheter was introduced. Thoracentesis was performed. The catheter was removed and a dressing applied. FINDINGS: A total of approximately 1.3 L of dark golden fluid was removed - patient developed symptomatic hypotension during procedure and as such procedure was aborted, residual fluid remains on post procedure ultrasound. Samples were sent to the laboratory as requested by the clinical team. IMPRESSION: Successful ultrasound guided left thoracentesis yielding 1.3 L of pleural fluid. Read by Lynnette Caffey, PA-C Electronically Signed   By: Gilmer Mor D.O.   On: 01/16/2019 10:50        Scheduled Meds:  feeding supplement (ENSURE ENLIVE)  237 mL Oral TID BM   metoCLOPramide (REGLAN) injection  5 mg Intravenous Q6H   nicotine  14 mg Transdermal Daily   polyethylene glycol  17 g Oral BID AC   simethicone  160 mg Oral QID   sodium chloride flush  10-40 mL Intracatheter Q12H   Continuous Infusions:  TPN ADULT (ION) 60 mL/hr at 01/16/19 1718   TPN ADULT (ION)       LOS: 20 days    Time spent: over 30 min    Richarda Overlie, MD [facility] Pager AMION  If 7PM-7AM, please contact night-coverage [email or web address] Password [email or web address] 01/17/2019, 1:20 PM

## 2019-01-17 NOTE — Progress Notes (Addendum)
PHARMACY - ADULT TOTAL PARENTERAL NUTRITION CONSULT NOTE   Pharmacy Consult for TPN Indication: Severe Pancreatitis  Patient Measurements: Height: 5\' 10"  (177.8 cm) Weight: 167 lb 5.3 oz (75.9 kg) IBW/kg (Calculated) : 73 TPN AdjBW (KG): 78 Body mass index is 24.01 kg/m. Usual Weight: 70.4 kg  HPI: 68 yoM admitted on 3/14 with pancreatitis (precipitated by hypertriglyceridemia).Hypertriglyceridemia was treated with insulin drip.Abdominal x-ray suggested early bowel obstruction.NGT remains on suction. Unable to tolerate clears.  Central access: PICC 3/23 TPN start date: 3/23  Significant events:   3/21 Reglan added  3/22:TPN ordered but unable to start today d/t ordering cut off time of 12:00,PICC line was ordered, butnot placed, concern with leukocytosis  3/23 Albumin x 4 doses; TPN started without lipids  3/25 bilateral pleural effusions s/p L thoracentesis, out. PICC with possible malpositioning - held use of PICC line temporarily.  TPN not documented as held.  PICC positioning corrected on imaging at 15:55 post thoracentesis.  3/26 NGT removed, advancing diet.  3/27: spoke to [facility] from GI team, ok to add lipid to TPN  3/28 Travasol on short supply; instructed to use Clinisol instead  4/1 transition to FLD  ASSESSMENT                                                                                                           Today, 01/17/2019:  Glucose (CBG goal 100-150) - well controlled w/o SSI  Electrolytes - Na remains borderline low, Phos now normalized, all others stable at goal  Renal - SCr, BUN:SCr, bicarb stable WNL, UOP not charted  LFTs - hovering around 3-4x ULNTbili now improved to WNL  TGs - admitted with TG > 5000; note lipids resumed to TPN 3/27; Goal TG < 150 mg/dL - currently slightly elevated in 150-200 range but not clearly trending up  Prealbumin - low, decreased since admission  Current Nutrition - FLD, ensure  increased to TID - tolerating Ensure BID previously but not much else (some apple juice)  IVF - none  NUTRITIONAL GOALS                                                                                             RD recs (3/26): Kcal:2100-2300 Protein:100-110g Fluid:2.1L/day  Ensure Enlive provides 350 kcal, 20g protein per drink  PLAN  At 1800 today:  Plan is to discharge patient tomorrow if tolerating Ensure TID. Rather than reduce TPN rate further to account for PO supplement intake, will adjust to 65 ml/hr and only run TPN over 18 hr; this should aid in discharging the patient on time tomorrow.  3-in-1 TPN at 60 ml/hr provides 70 g protein and 1070 kcal (still 100% protein goals and > 80% of goals even if only 2 Ensures taken)  Will switch back to 10% AA (was using Clinisol 15% when travasol supply temporarily interrupted) - increase in rate is only d/t extra volume required for travasol  Add MVI, trace elements and famotidine to TPN (replacing pantoprazole d/t concern for hepatotoxicity)  Electrolytes: cont increased Na, no phos; continue others unchanged; Cl:Ac 1:1  No SSI  TPN lab panels on Mon and Thurs  Bernadene Person, PharmD, BCPS [phone] 01/17/2019, 9:50 AM

## 2019-01-17 NOTE — Progress Notes (Signed)
[facility] Progress Note    Since last GI note: Thoracentesis yesterday, seemed to have exacerbated left chest pleuritic pains.  Reevaluation by IR, CXR showed no pneumothorax. Tolerating full liquids pretty well yesterday including 2 full ensures (350cal each).  Slept well overnight. Left chest pain better but not gone.  Continued + flatus  Objective: Vital signs in last 24 hours: Temp:  [98.1 F (36.7 C)-98.3 F (36.8 C)] 98.3 F (36.8 C) (04/03 0451) Pulse Rate:  [116-121] 121 (04/03 0451) Resp:  [16-18] 16 (04/03 0451) BP: (89-132)/(44-91) 125/78 (04/03 0451) SpO2:  [96 %] 96 % (04/03 0451) Last BM Date: 01/15/19 General: alert and oriented times 3 Heart: regular rate and rythm Abdomen: soft, non-tender, non-distended, normal bowel sounds   Lab Results: Recent Labs    01/15/19 0351  WBC 10.6*  HGB 8.2*  PLT 512*  MCV 99.3   Recent Labs    01/15/19 0351 01/16/19 0324  NA 134* 134*  K 3.9 4.0  CL 100 102  CO2 23 24  GLUCOSE 107* 99  BUN 17 15  CREATININE 0.59* 0.52*  CALCIUM 8.0* 8.0*   Recent Labs    01/15/19 0351 01/16/19 0324  PROT 5.9* 5.4*  ALBUMIN 2.3* 2.2*  AST 88* 86*  ALT 131* 129*  ALKPHOS 175* 186*  BILITOT 1.5* 1.1     Medications: Scheduled Meds: . feeding supplement (ENSURE ENLIVE)  237 mL Oral BID BM  . metoCLOPramide (REGLAN) injection  5 mg Intravenous Q6H  . nicotine  14 mg Transdermal Daily  . polyethylene glycol  17 g Oral BID AC  . simethicone  160 mg Oral QID  . sodium chloride flush  10-40 mL Intracatheter Q12H   Continuous Infusions: . TPN ADULT (ION) 60 mL/hr at 01/16/19 1718   PRN Meds:.acetaminophen, cyclobenzaprine, dextrose, diphenhydrAMINE, diphenhydrAMINE, ibuprofen, labetalol, morphine injection, ondansetron **OR** ondansetron (ZOFRAN) IV, phenol, sodium chloride flush    Assessment/Plan: 28 y.o. male with severe mixed etoh/hypertriglyceride acute pancreatitis  Ileus clearly improving;  he's been passing flatus for 2-3 days and for the past 24-36 hours tolerating full liquids including 700 cal of ensure yesterday.  Scheduled reglan may be helping.  He should stay on full liquids, he's going to try to drink 3 ensure today.  If he tolerates that then I think it will be safe to d/c his TNA tomorrow.  He will continue ambulating in halls.  Seems most limited by the recurrent pleural effusions, left chest pains.  Hopefully that will improve as he recovers further.  Will follow along.  Possibly home Saturday or Sunday depending if we can wean the TNA off and his pleuritic issues stablize.   Rachael Fee, MD  01/17/2019, 7:00 AM [facility] Pager [phone]

## 2019-01-18 LAB — GLUCOSE, CAPILLARY
Glucose-Capillary: 98 mg/dL (ref 70–99)
Glucose-Capillary: 124 mg/dL — ABNORMAL HIGH (ref 70–99)

## 2019-01-18 LAB — CBC
HCT: 26.2 % — ABNORMAL LOW (ref 39.0–52.0)
HCT: 28.6 % — ABNORMAL LOW (ref 39.0–52.0)
Hemoglobin: 8.1 g/dL — ABNORMAL LOW (ref 13.0–17.0)
Hemoglobin: 8.9 g/dL — ABNORMAL LOW (ref 13.0–17.0)
MCH: 29.9 pg (ref 26.0–34.0)
MCH: 30 pg (ref 26.0–34.0)
MCHC: 30.9 g/dL (ref 30.0–36.0)
MCHC: 31.1 g/dL (ref 30.0–36.0)
MCV: 96.7 fL (ref 80.0–100.0)
MCV: 96.3 fL (ref 80.0–100.0)
Platelets: 496 10*3/uL — ABNORMAL HIGH (ref 150–400)
Platelets: 509 10*3/uL — ABNORMAL HIGH (ref 150–400)
RBC: 2.71 MIL/uL — ABNORMAL LOW (ref 4.22–5.81)
RBC: 2.97 MIL/uL — ABNORMAL LOW (ref 4.22–5.81)
RDW: 14.1 % (ref 11.5–15.5)
RDW: 14 % (ref 11.5–15.5)
WBC: 9.3 10*3/uL (ref 4.0–10.5)
WBC: 9.3 10*3/uL (ref 4.0–10.5)
nRBC: 0 % (ref 0.0–0.2)
nRBC: 0 % (ref 0.0–0.2)

## 2019-01-18 LAB — TRIGLYCERIDES: Triglycerides: 170 mg/dL — ABNORMAL HIGH (ref <150)

## 2019-01-18 LAB — PROCALCITONIN: Procalcitonin: 0.35 ng/mL

## 2019-01-18 MED ORDER — HYDROCODONE-ACETAMINOPHEN 5-325 MG PO TABS
1.0000 | ORAL_TABLET | ORAL | Status: DC | PRN
  Administered 2019-01-18 – 2019-01-19 (×5): 1 via ORAL
  Administered 2019-01-19: 2 via ORAL
  Administered 2019-01-19: 1 via ORAL
  Filled 2019-01-18: qty 1
  Filled 2019-01-18: qty 2
  Filled 2019-01-18 (×5): qty 1

## 2019-01-18 MED ORDER — METOCLOPRAMIDE HCL 5 MG PO TABS
5.0000 mg | ORAL_TABLET | Freq: Three times a day (TID) | ORAL | Status: DC
  Administered 2019-01-18 – 2019-01-19 (×4): 5 mg via ORAL
  Filled 2019-01-18 (×4): qty 1

## 2019-01-18 NOTE — Progress Notes (Signed)
Talked to pharmacist r/t TPN. No further pharmacist consult to continue TPN per MD at this time, therefore will d/c current TPN bag.

## 2019-01-18 NOTE — Progress Notes (Signed)
Pt remains tachy with increased respirations during movement.

## 2019-01-18 NOTE — Progress Notes (Signed)
Ambulating on room air approximately 100 feet, patient maintained oxygen saturations at 93-95%. Tachycardiac at 157 bpm. Pt c/o chest pain when breathing. This has continued for past weeks. Pt went back to bed with tachy slowing to mid teens, chest pain when breathing improved.

## 2019-01-18 NOTE — Progress Notes (Signed)
   01/18/19 1936  Vitals  ECG Heart Rate (!) 160 (Charles Dougherty NOTIFY)  MEWS Score  MEWS RR 0  MEWS Pulse 3  MEWS Systolic 0  MEWS LOC 0  MEWS Temp 0  MEWS Score 3  MEWS Score Color Yellow  Pt ambulating, HR elevated. At rest HR is 120s.

## 2019-01-18 NOTE — Progress Notes (Addendum)
[facility] Progress Note    Since last GI note: He is tolerating soft foods, drank 3 ensures yesterday. Still having left chest pains, requiring pain meds for it.  Objective: Vital signs in last 24 hours: Temp:  [98.2 F (36.8 C)-98.9 F (37.2 C)] 98.2 F (36.8 C) (04/04 0552) Pulse Rate:  [107-119] 107 (04/04 0552) Resp:  [16-17] 16 (04/04 0552) BP: (124-131)/(83-87) 124/83 (04/04 0552) SpO2:  [96 %-98 %] 96 % (04/04 0552) Last BM Date: 01/17/19 General: alert and oriented times 3 Heart: regular rate and rythm Abdomen: soft, non-tender, non-distended, normal bowel sounds   Lab Results: Recent Labs    01/18/19 0409 01/18/19 0840  WBC 9.3 9.3  HGB 8.1* 8.9*  PLT 496* 509*  MCV 96.7 96.3   Recent Labs    01/16/19 0324 01/17/19 0435  NA 134* 136  K 4.0 4.5  CL 102 103  CO2 24 25  GLUCOSE 99 114*  BUN 15 16  CREATININE 0.52* 0.50*  CALCIUM 8.0* 8.1*   Recent Labs    01/16/19 0324  PROT 5.4*  ALBUMIN 2.2*  AST 86*  ALT 129*  ALKPHOS 186*  BILITOT 1.1     Medications: Scheduled Meds: . feeding supplement (ENSURE ENLIVE)  237 mL Oral TID BM  . metoCLOPramide (REGLAN) injection  5 mg Intravenous Q6H  . nicotine  14 mg Transdermal Daily  . polyethylene glycol  17 g Oral BID AC  . simethicone  160 mg Oral QID  . sodium chloride flush  10-40 mL Intracatheter Q12H   Continuous Infusions: PRN Meds:.acetaminophen, cyclobenzaprine, dextrose, diphenhydrAMINE, diphenhydrAMINE, ibuprofen, labetalol, morphine injection, ondansetron **OR** ondansetron (ZOFRAN) IV, phenol, sodium chloride flush   Assessment/Plan: 28 y.o. male recovering from etoh/hypertriglyceride severe acute pancreatitis.  The peripancreatic acute fluid collections will either resorb or coalesce into pseudocysts over the next few weeks.  His ileus has clearly resolved, he is tolerating ensures and soft foods. I recommended he take it very slow while advancing his diet over the  next week or two.  Low fat diet may be best. He asked for a list of foods that would be appropriate (soft, low fat) and I think a formal dietician consult could be helpful.  He no longer needs TNA, I am d/cing it.  I am changing his reglan to oral (still qid scheduled).  His is still fairly deconditioned and is still bothered by left chest pains for which he is intermittently requiring narc pain meds. I am changing him to oral (vocodin type) for now.  I am not sure what to expect with the recurrent pleural effusion.  Hospitalist to help guide on this. He is getting very tachycardic when he ambulates and is on oxygen.  These issues will possibly limit d/c.  Maybe he needs some time at a rehab prior to going home?  He has been off abx and afebrile now for 2 1/2 days.  GI will return to see him on Monday, please call sooner if any questions or concerns.   Rachael Fee, MD  01/18/2019, 12:44 PM  Gastroenter[facility] Pager [phone]

## 2019-01-18 NOTE — Progress Notes (Signed)
PROGRESS NOTE    Charles Dougherty  GBT:[id number] DOB: 19-Mar-1991 DOA: 12/27/2018 PCP: Patient, No Pcp Per   Brief Narrative:  Patient is a 28 year old male with history of alcohol abuse, substance abuse, smoker who presents to the emergencydepartment with complaints of abdominal pain. Admitted for the management of acute pancreatitis related to alcohol/hypertriglyceridemia.  found to havesevere hypertriglyceridemia. Hospital course remarkable for abdominal discomfort, distention .Abdominal x-ray suggested early bowel obstruction. General surgery consulted and he was started on conservative management with NG tube decompression, n.p.o. status. Unclear if the pancreatitis was precipitated by alcohol or hypertriglyceridemia.Due to severe hypertriglyceridemia,westarted on insulin drip and moved to stepdown. GI also consulted.  Pt admitted for severe pancreatitis 2/2 elevated triglycerides and etoh use.  Stay has been complicated by ileus s/p NG tube, L sided exudative effusion thought 2/2 pancreatitis, and fevers thought 2/2 pneumonia vs pancreatitis.  Currently with improving PO intake, starting to take more by mouth.  He's on TPN.  GI following.  Assessment & Plan:   Principal Problem:   Acute necrotizing pancreatitis Active Problems:   Acute alcoholic pancreatitis   Pancreatitis   ETOH abuse   Drug abuse (HCC)   Cystitis   Ileus (HCC)   Hypertriglyceridemia   Abdominal pain   SBO (small bowel obstruction) (HCC)   Hypophosphatemia   Hypomagnesemia   Hypocalcemia   AKI (acute kidney injury) (HCC)   Anemia   Fever   Pneumonia of left lower lobe due to infectious organism (HCC)   Abnormal liver enzymes   1 Acute necrotizing pancreatitis secondary to alcohol and hypertriglyceridemia   Ileus Presented 3/13 with elevated lipase and triglycerides >5000 Daily drinker CT from 3/14 with acute edematous interstitial pancreatitis 3/22 CT with evidence of necrotizing pancreatitis.   New free fluid and phlegmonous/inflammatory material surrounding the pancrease extending into bilateral paracolic gutters and pelvis (see report) PT having BM's.  Tolerating full liquid diet  increased abdominal distension/bloating on 3/26 PM.  Abdominal x ray repeated, suggestive of small bowel ileus Ileus improving, removed NGT  4/1.  Continue to advance diet, per GI recommendations Repeat CT scan from 3/28 with severe pancreatitis with extensive pseudocyst formation throught the retroperitoneum and upper abdomen.  Biliary sludge in the gallbladder without definite findings to suggest an acute cholecystitis at this time.  Large L and small R pleural effusions. TPN for nutrition until by mouth intake is established, directed by GI Appreciate gastroenterology recommendations, daily electrolytes and triglycerides, continue TPN D/c meropenem.  Morphine prn pain (increased dose due to back pain).  D/c toradol now (s/p 5 days).   #.  Bilateral Pleural Effusions (L>R)   Left lower lobe atelectasis/consolidation (possible pneumonia), persistent fevers Noted on CXR from 3/25 Blood and urine cx NGTD Leukocytosis present, but improving Negative urine strep and legionella completed meropenem for possible pneumonia and necrotizing pancreatitis S/p thoracentesis by IR on 3/26, with 640 cc hazy dark amber/tea colored fluid Exudative by light's.  Pleural fluid culture no growth so far, pH 7.5, and cytology (reactive appearing mesothelial cells).  Follow add on amylase (24).  Possibly 2/2 acute pancreatitis? Repeat CXR 3/28 (notable for small L pleural effusion with persistent LLL atelectasis) given effusions on CT 3/27.   CT chest PE protocol on 4/1 showed a large layering left pleural effusion, s/p thoracentesis with similar labs, procalcitonin 0.56, chest pain post thoracentesis improving with Toradol and ibuprofen, no pneumothorax Blood cultures 2,  4/2 because of persistent fevers   2.  Severe  hypertriglyceridemia Patient with family  history of hypertriglyceridemia. Patient was placed on the insulin drip with significant improvement with elevated triglyceride levels.   Triglycerides have now been stable, 170 today Will need fibrate on d/c  3.  Small bowel obstruction versus ileus secondary to pancreatitis Abdominal films 3/24 with ileus vs SBO, suspect ileus more likely as pt having BM's Continue simethicone 160 mg p.o. 4 times daily.   Patient started on scheduled IV Reglan per gastroenterology.   CT abdomen/pelvis as noted above Advance diet as tolerated per GI, anticipate he will discharge in the next 24  hours  6.  Fever/pleuritic chest pain Pleuritic chest pain likely secondary to thoracentesis/pancreatitis/sympathetic effusion.   Completed abx for possible pneumonia and necrotizing pancreatitis.   S/p thora x2 Blood and urine cx ngtd. Continue to monitor off abx, if recurrent, will need to reculture and consider restarting abx, but he remains hemodynamically stable.   7.  Hypophosphatemia/hypokalemia Replace and follow.   8.  Anemia S/p 1 unit on 3/19 and 1 unit on 3/24  Likely dilutional and secondary to worsening pancreatitis.   Patient with no overt bleedheing.   Anemia panel consistent with anemia of chronic disease/iron deficiency anemia. Hg 8.2  FOBT negative.   Per GI upper endoscopy to be done in the outpatient setting.  GI following.   9.  Hypocalcemia/hypoalbuminemia Hypocalcemia corrects with albumin Continue to monitor with TPN   10.  Transaminitis   Elevated Bilirubin Images done did not show any gallbladder stones or inflammation.  Patient noted to have diffuse fatty liver.   AST and ALT improving Bili improving Negative acute hepatitis panel  11.  Thrombocytopenia   Thrombocytosis Likely secondary to alcoholic liver disease.  Lovenox discontinued.  No overt bleeding.  Now with elevated platelets.  12.  Suspected cystitis Per CT  imaging.  Patient currently asymptomatic.  Urinalysis unremarkable.   13.  Acute kidney injury Resolved with hydration.  Follow.   14.  Hypoglycemic events 2/2 insulin gtt and NPO status. Now on TPN Follow   15 alcohol abuse No signs of withdrawal.  Alcohol cessation stressed to patient.  Social work consulted.  Follow.  16.  Malnutrition Patient presented with acute pancreatitis.  Has essentially been n.p.o. since admission.  Prealbumin of 7.0.  TPN ordered and started the evening of 01/06/2019.  Patient also placed on IV albumin every 6 hours x1 day.  GI following.  # PICC in place, imaging distal PICC tip flipped back and could be entering azygos vein.  Now better positioned.  # Insomnia: benadryl ordered for insomia prn (ambien/restoril have not helped)  # Hypertension: start amlodipine  # Sinus Tachycardia: likely 2/2 above, continue to monitor   DVT prophylaxis: SCD Code Status: full  Family Communication: none at bedside Disposition Plan: pending improvement, anticipate discharge tomorrow   Consultants:   GI  IR  Procedures:   none  Antimicrobials:  Anti-infectives (From admission, onward)   Start     Dose/Rate Route Frequency Ordered Stop   01/06/19 1800  meropenem (MERREM) 1 g in sodium chloride 0.9 % 100 mL IVPB  Status:  Discontinued     1 g 200 mL/hr over 30 Minutes Intravenous Every 8 hours 01/06/19 1722 01/11/19 1521   01/05/19 1800  levofloxacin (LEVAQUIN) IVPB 750 mg  Status:  Discontinued     750 mg 100 mL/hr over 90 Minutes Intravenous Every 24 hours 01/05/19 0954 01/05/19 1000   01/05/19 1800  levofloxacin (LEVAQUIN) IVPB 500 mg  Status:  Discontinued  500 mg 100 mL/hr over 60 Minutes Intravenous Every 24 hours 01/05/19 1012 01/05/19 1041   01/05/19 1130  levofloxacin (LEVAQUIN) IVPB 750 mg  Status:  Discontinued     750 mg 100 mL/hr over 90 Minutes Intravenous Daily 01/05/19 1041 01/06/19 1715   01/04/19 1800  levofloxacin (LEVAQUIN)  IVPB 500 mg  Status:  Discontinued     500 mg 100 mL/hr over 60 Minutes Intravenous Every 24 hours 01/04/19 1612 01/05/19 0954     Subjective: Patient states that he tried to eat grits yesterday, his appetite is improving, he has been ambulating, he remains afebrile, his chest pain is improving  Objective: Vitals:   01/17/19 0451 01/17/19 1337 01/17/19 2213 01/18/19 0552  BP: 125/78 131/87 129/86 124/83  Pulse: (!) 121 (!) 118 (!) 119 (!) 107  Resp: Temp: 98.3 F (36.8 C) 98.3 F (36.8 C) 98.9 F (37.2 C) 98.2 F (36.8 C)  TempSrc: Oral  Oral Oral  SpO2: 96% 98% 97% 96%  Weight:      Height:        Intake/Output Summary (Last 24 hours) at 01/18/2019 0953 Last data filed at 01/18/2019 0600 Gross per 24 hour  Intake 2225 ml  Output 1525 ml  Net 700 ml   Filed Weights   01/10/19 2052 01/14/19 0500 01/16/19 0525  Weight: 75.5 kg 73.9 kg 75.9 kg    Examination:  General: No acute distress. Cardiovascular: Heart sounds show a regular rate, and rhythm. Lungs: Clear to auscultation bilaterally. Abdomen: Soft, nontender, distended Neurological: Alert and oriented 3. Moves all extremities 4. Cranial nerves II through XII grossly intact. Skin: Warm and dry. No rashes or lesions. Extremities: No clubbing or cyanosis. No edema. Psychiatric: Mood and affect are normal. Insight and judgment are appropriate.   Data Reviewed: I have personally reviewed following labs and imaging studies  CBC: Recent Labs  Lab 01/13/19 0314 01/14/19 0433 01/15/19 0351 01/18/19 0409 01/18/19 0840  WBC 12.7* 11.8* 10.6* 9.3 9.3  NEUTROABS 9.9*  --   --   --   --   HGB 8.7* 8.4* 8.2* 8.1* 8.9*  HCT 28.6* 26.9* 27.5* 26.2* 28.6*  MCV 99.0 97.1 99.3 96.7 96.3  PLT 611* 526* 512* 496* 509*   Basic Metabolic Panel: Recent Labs  Lab 01/13/19 0314 01/14/19 0433 01/15/19 0351 01/16/19 0324 01/17/19 0435  NA 133* 132* 134* 134* 136  K 4.3 4.1 3.9 4.0 4.5  CL 101 100 100 102 103   CO2 GLUCOSE 105* 111* 107* 99 114*  BUN CREATININE 0.64 0.55* 0.59* 0.52* 0.50*  CALCIUM 8.4* 8.1* 8.0* 8.0* 8.1*  MG 2.3 2.1 2.3 2.1 2.1  PHOS 4.8* 4.9* 4.8* 5.0* 4.4   GFR: Estimated Creatinine Clearance: 145.7 mL/min (A) (by C-G formula based on SCr of 0.5 mg/dL (L)). Liver Function Tests: Recent Labs  Lab 01/12/19 0407 01/13/19 0314 01/14/19 0433 01/15/19 0351 01/16/19 0324  AST 97* 85* 66* 88* 86*  ALT 161* 148* 118* 131* 129*  ALKPHOS 117 126 146* 175* 186*  BILITOT 1.8* 2.2* 1.6* 1.5* 1.1  PROT 5.9* 6.1* 5.8* 5.9* 5.4*  ALBUMIN 2.5* 2.5* 2.4* 2.3* 2.2*   No results for input(s): LIPASE, AMYLASE in the last 168 hours. No results for input(s): AMMONIA in the last 168 hours. Coagulation Profile: No results for input(s): INR, PROTIME in the last 168 hours. Cardiac Enzymes: No results for input(s): CKTOTAL, CKMB,  CKMBINDEX, TROPONINI in the last 168 hours. BNP (last 3 results) No results for input(s): PROBNP in the last 8760 hours. HbA1C: No results for input(s): HGBA1C in the last 72 hours. CBG: Recent Labs  Lab 01/15/19 1648 01/16/19 0615 01/17/19 0412 01/17/19 2354 01/18/19 0556  GLUCAP 127* 94 113* 99 98   Lipid Profile: Recent Labs    01/17/19 0435 01/18/19 0409  TRIG 173* 170*   Thyroid Function Tests: No results for input(s): TSH, T4TOTAL, FREET4, T3FREE, THYROIDAB in the last 72 hours. Anemia Panel: No results for input(s): VITAMINB12, FOLATE, FERRITIN, TIBC, IRON, RETICCTPCT in the last 72 hours. Sepsis Labs: Recent Labs  Lab 01/16/19 0855 01/17/19 0435 01/18/19 0409  PROCALCITON 0.56 0.40 0.35    Recent Results (from the past 240 hour(s))  Culture, body fluid-bottle     Status: None   Collection Time: 01/08/19  3:48 PM  Result Value Ref Range Status   Specimen Description PLEURAL  Final   Special Requests NONE  Final   Culture   Final    NO GROWTH 5 DAYS Performed at [facility], [address] [place], [place] [address]    Report Status 01/13/2019 FINAL  Final  Gram stain     Status: None   Collection Time: 01/08/19  3:48 PM  Result Value Ref Range Status   Specimen Description PLEURAL  Final   Special Requests NONE  Final   Gram Stain   Final    MODERATE WBC PRESENT, PREDOMINANTLY PMN NO ORGANISMS SEEN Performed at [facility], [address] [place], [place] [address]    Report Status 01/08/2019 FINAL  Final  Culture, blood (routine x 2)     Status: None (Preliminary result)   Collection Time: 01/16/19  8:55 AM  Result Value Ref Range Status   Specimen Description   Final    BLOOD RIGHT HAND Performed at [facility], [address], [place], [place] [address]    Special Requests   Final    BOTTLES DRAWN AEROBIC ONLY Blood Culture adequate volume Performed at [facility], [address] [place], [place] [address]    Culture   Final    NO GROWTH 1 DAY Performed at [facility], [address], [place], [place] [address]    Report Status PENDING  Incomplete  Culture, blood (routine x 2)     Status: None (Preliminary result)   Collection Time: 01/16/19  8:55 AM  Result Value Ref Range Status   Specimen Description   Final    BLOOD RIGHT HAND Performed at [facility], [address], [place], [place] [address]    Special Requests   Final    BOTTLES DRAWN AEROBIC AND ANAEROBIC Blood Culture adequate volume Performed at [facility], [address] [place], [place] [address]    Culture   Final    NO GROWTH 1 DAY Performed at [facility], [address] [place], [place] [address]    Report Status PENDING  Incomplete  Body fluid culture     Status: None (Preliminary result)   Collection Time: 01/16/19 11:09 AM  Result Value Ref Range Status   Specimen Description   Final    PLEURAL LEFT Performed at [facility], [address]  [place], [place] [address]    Special Requests   Final    NONE Performed at [facility], [address] [place], [place] [address]    Gram Stain FEW WBC SEEN NO ORGANISMS SEEN   Final   Culture   Final    NO GROWTH < 24 HOURS Performed at [facility], [address] [place], [place] [address]    Report Status PENDING  Incomplete         Radiology Studies: Dg Chest 1 View  Result Date: 01/16/2019 CLINICAL DATA:  Post left thoracentesis EXAM: CHEST  1 VIEW COMPARISON:  CT 01/15/2019 FINDINGS: Small left pleural effusion remains following thoracentesis. No pneumothorax. Left PICC line is in place with the tip at the cavoatrial junction. Left base atelectasis. Right perihilar atelectasis. Heart is normal size. IMPRESSION: Decreasing left effusion following thoracentesis with small residual left effusion. No pneumothorax. Right perihilar and left basilar atelectasis. Electronically Signed   By: Charlett Nose M.D.   On: 01/16/2019 10:50   Dg Chest Port 1 View  Result Date: 01/16/2019 CLINICAL DATA:  Left-sided chest pain. Patient underwent thoracentesis today. EXAM: PORTABLE CHEST 1 VIEW COMPARISON:  01/16/2019 at 10:48 a.m. FINDINGS: No change from the earlier exam.  No pneumothorax. Persistent left lung base opacity consistent with a combination of a small residual effusion with either atelectasis or pneumonia. Mild atelectasis extending laterally and inferiorly from the right hilum is also unchanged. No new lung abnormalities. Stable left sided PICC. IMPRESSION: 1. No change from the earlier exam.  No pneumothorax. Electronically Signed   By: Amie Portland M.D.   On: 01/16/2019 13:35   US Thoracentesis Asp Pleural Space W/img Guide  Result Date: 01/16/2019 INDICATION: Patient with acute necrotizing pancreatitis secondary to ETOH abuse and severe hypertriglyceridemia with left sided chest pain, dyspnea with exertion and fever. Imaging shows recurrent left pleural  effusion. Request for diagnostic and therapeutic thoracentesis today in IR. EXAM: ULTRASOUND GUIDED LEFT THORACENTESIS MEDICATIONS: 10 mL 1% lidocaine COMPLICATIONS: None immediate. PROCEDURE: An ultrasound guided thoracentesis was thoroughly discussed with the patient and questions answered. The benefits, risks, alternatives and complications were also discussed. The patient understands and wishes to proceed with the procedure. Written consent was obtained. Ultrasound was performed to localize and mark an adequate pocket of fluid in the left chest. The area was then prepped and draped in the normal sterile fashion. 1% Lidocaine was used for local anesthesia. Under ultrasound guidance a 6 Fr Safe-T-Centesis catheter was introduced. Thoracentesis was performed. The catheter was removed and a dressing applied. FINDINGS: A total of approximately 1.3 L of dark golden fluid was removed - patient developed symptomatic hypotension during procedure and as such procedure was aborted, residual fluid remains on post procedure ultrasound. Samples were sent to the laboratory as requested by the clinical team. IMPRESSION: Successful ultrasound guided left thoracentesis yielding 1.3 L of pleural fluid. Read by Lynnette Caffey, PA-C Electronically Signed   By: Gilmer Mor D.O.   On: 01/16/2019 10:50        Scheduled Meds:  feeding supplement (ENSURE ENLIVE)  237 mL Oral TID BM   metoCLOPramide (REGLAN) injection  5 mg Intravenous Q6H   nicotine  14 mg Transdermal Daily   polyethylene glycol  17 g Oral BID AC   simethicone  160 mg Oral QID   sodium chloride flush  10-40 mL Intracatheter Q12H   Continuous Infusions:  TPN ADULT (ION) 65 mL/hr at 01/17/19 1743     LOS: 21 days    Time spent: over 30 min    Richarda Overlie, MD [facility] Pager AMION  If 7PM-7AM,  please contact night-coverage [email or web address] Password [facility] 01/18/2019, 9:53 AM

## 2019-01-19 LAB — TRIGLYCERIDES: Triglycerides: 184 mg/dL — ABNORMAL HIGH (ref <150)

## 2019-01-19 LAB — BODY FLUID CULTURE: Culture: NO GROWTH

## 2019-01-19 MED ORDER — FENOFIBRATE 160 MG PO TABS: 160.0000 mg | ORAL_TABLET | Freq: Every day | ORAL | 0 refills | Status: DC

## 2019-01-19 MED ORDER — IBUPROFEN 200 MG PO TABS: 400.0000 mg | ORAL_TABLET | Freq: Four times a day (QID) | ORAL | Status: DC | PRN

## 2019-01-19 MED ORDER — METOCLOPRAMIDE HCL 5 MG PO TABS: 5.0000 mg | ORAL_TABLET | Freq: Three times a day (TID) | ORAL | 0 refills | Status: DC

## 2019-01-19 MED ORDER — HYDROCODONE-ACETAMINOPHEN 5-325 MG PO TABS: 1.0000 | ORAL_TABLET | ORAL | 0 refills | Status: AC | PRN

## 2019-01-19 MED ORDER — FENOFIBRATE 160 MG PO TABS
160.0000 mg | ORAL_TABLET | Freq: Every day | ORAL | Status: DC
  Administered 2019-01-19: 160 mg via ORAL
  Filled 2019-01-19: qty 1

## 2019-01-19 NOTE — Progress Notes (Signed)
Pt leaving this afternoon with his mother. Alert, oriented, and without c/o. Discharge instructions/prescriptions given/explained with pt verbalizing understanding. Pt aware of symptoms causing him to return. Pt aware of followup appointments.

## 2019-01-19 NOTE — Discharge Summary (Signed)
Physician Discharge Summary  Charles Dougherty ZOX:[id number] DOB: 1991/09/10 DOA: 12/27/2018  PCP: Patient, No Pcp Per  Admit date: 12/27/2018 Discharge date: 01/19/2019  Admitted From: Home Disposition: Home   Recommendations for Outpatient Follow-up:  1. Follow up with PCP in 1-2 weeks. 2. Follow up with [facility], Dr. Barron Alvine, within the next 1-2 weeks. 3. Continue smoking and alcohol cessation counseling. Will also need trauma counseling as outpatient.  4. Monitor for pseudocyst formation if abdominal distention does not continue to improve.   Home Health: None Equipment/Devices: None Discharge Condition: Stable CODE STATUS: Full Diet recommendation: Soft, low residue, low fat advance very slowly  Brief/Interim Summary: Patient is a 28 year old male with history of alcohol abuse, substance abuse, smoker who presents to the emergencydepartment with complaints of abdominal pain. Admitted for the management of acute pancreatitis related to alcohol/hypertriglyceridemia.  found to havesevere hypertriglyceridemia. Hospital course remarkable for abdominal discomfort, distention .Abdominal x-ray suggested early bowel obstruction. General surgery consulted and he was started on conservative management with NG tube decompression, n.p.o. status. Unclear if the pancreatitis was precipitated by alcohol or hypertriglyceridemia.Due to severe hypertriglyceridemia,westarted on insulin drip and moved to stepdown. GI also consulted.  Pt admitted for severe pancreatitis 2/2 elevated triglycerides and etoh use.  Stay has been complicated by ileus s/p NG tube, left sided exudative effusion thought to be due to pancreatitis, and fevers thought to be due to pneumonia vs pancreatitis. Respiratory status improved steadily with thoracentesis and antibiotics. No longer hypoxic. PO intake improved, TPN stopped, and pain has greatly improved. Benefit to hospitalization has been maximized and the patient was  discussed with GI, Dr. Christella Hartigan, who concurs that the patient should be safe for discharge with close GI follow up.   Discharge Diagnoses:  Principal Problem:   Acute necrotizing pancreatitis Active Problems:   Acute alcoholic pancreatitis   Pancreatitis   ETOH abuse   Drug abuse (HCC)   Cystitis   Ileus (HCC)   Hypertriglyceridemia   Abdominal pain   SBO (small bowel obstruction) (HCC)   Hypophosphatemia   Hypomagnesemia   Hypocalcemia   AKI (acute kidney injury) (HCC)   Anemia   Fever   Pneumonia of left lower lobe due to infectious organism (HCC)   Abnormal liver enzymes  Acutenecrotizingpancreatitis secondary to alcohol and hypertriglyceridemia: Presented 3/13 with elevated lipase and triglycerides >5000 - Completed course of meropenem 3/28 and leukocytosis resolved, afebrile.  - CT 3/14 with acute edematous interstitial pancreatitis > 3/22 CT with evidence of necrotizing pancreatitis.  New free fluid and phlegmonous/inflammatory material surrounding the pancrease extending into bilateral paracolic gutters and pelvis (see report) > Repeat CT 3/28 with severe pancreatitis with extensive pseudocyst formation throught the retroperitoneum and upper abdomen.  Biliary sludge in the gallbladder without definite findings to suggest an acute cholecystitis at this time.  Large L and small R pleural effusions. - Continue ibuprofen and vicodin for pain. PMPAware queried, no controlled substances prescribed in database for past 2 years.   Ileus: increased abdominal distension/bloating on 3/26 PM. Abdominal x ray repeated, suggestive of small bowel ileus. Removed NGT 4/1.Resolved since. TPN stopped and pt tolerating diet, having soft/loose BMs. - Continue reglan x2 weeks per discussion with GI, Dr. Christella Hartigan.  - Continue laxative as needed to maintain regular BMs  Bilateral Pleural Effusions (L>R)  Left lower lobe atelectasis/consolidation (possible pneumonia), persistent fevers: Treated with  levaquin and meropenem. Leukocytosis resolved, afebrile, no hypoxia, and exam shows good aeration on day of discharge. Blood and urine cx NGTD.  -  S/p thoracentesis by IR on 3/26, with 640 cc hazy dark amber/tea colored fluid Exudative by light's.  Pleural fluid culture no growth, pH 7.5, and cytology (reactive appearing mesothelial cells), amylase (24).  Possibly 2/2 acute pancreatitis? Repeat CXR 3/28 (notable for small L pleural effusion with persistent LLL atelectasis) given effusions on CT 3/27.   CT chest PE protocol on 4/1 showed a large layering left pleural effusion, s/p thoracentesis with similar labs, procalcitonin 0.56, chest pain post thoracentesis improving with Toradol and ibuprofen, no pneumothorax Blood cultures 2,  4/2 because of persistent fever  Severe hypertriglyceridemia: Patient with family history of hypertriglyceridemia. Patient was placed on the insulin drip with significant improvement with elevated triglyceride levels.  - Triglycerides stabilized. Will discharge on fenofibrate  Pleuritic chest pain: Overall improving. Pleuritic chest pain likely secondary to thoracentesis/pancreatitis/sympathetic effusion. Completed abx for possible pneumonia and necrotizing pancreatitis.   S/p thora x2 Blood and urine cx ngtd. Continue to monitor off abx, if recurrent, will need to reculture and consider restarting abx, but he remains hemodynamically stable.  Hypophosphatemia/hypokalemia: Improved with replacement - Follow up   Anemia of acute and chronic disease: s/p 1 unit on 3/19 and 1 unit on 3/24. No bleeding, negative FOBT.  - Consider EGD as outpatient.   Hypocalcemia/hypoalbuminemia: Hypocalcemia corrects with albumin - Recheck at follow up  Transaminitis  Elevated Bilirubin: Images done did not show any gallbladder stones or inflammation. Patient noted to have diffuse fatty liver.  AST and ALT improving Bili improving Negative acute hepatitis  panel  Thrombocytopenia: Likely secondary to alcoholic liver disease. Lovenox discontinued. No overt bleeding. Resolved, now with reactive thrombocytosis.  Suspected cystitis: Per CT imaging. Patient currently asymptomatic. Urinalysis unremarkable. No targeted treatment rendered.  Acute kidney injury - Resolved with hydration.   Hypoglycemic events 2/2 insulin gtt and NPO status. Resolved.  Alcohol abuse: No signs of withdrawal.  - Alcohol cessation stressed to patient who has significant social supports in place. Social work consulted.    Acute protein calorie malnutrition, severity not specified: Patient presented with acute pancreatitis. Prolonged NPO, treated with TPN, stopped 4/4 and tolerating diet.    Insomnia:  - Follow up with PCP  Hypertension:  - Follow up with PCP  Sinus Tachycardia: likely 2/2 above, continue to monitor. Overall improving and consistent with systemic inflammatory state. No hypoxia.  Discharge Instructions Discharge Instructions    Diet - low sodium heart healthy   Complete by:  As directed    Discharge instructions   Complete by:  As directed    You were admitted for severe pancreatitis due to high triglycerides and alcohol use. This has improved enough for you to be discharged with the following recommendations:  - Take reglan 4 times daily for the next 2 weeks - Take fenofibrate daily to lower triglyceride levels. - You may take ibuprofen as needed for mild-moderate pain and vicodin as needed for moderate-severe pain.  - Call  GI tomorrow to schedule an appointment to follow up in the next 1-2 weeks. - If your abdominal pain returns, distention worsens, or you develop fever, shortness of breath or your chest pain worsens, seek medical attention right away.   Increase activity slowly   Complete by:  As directed      Allergies as of 01/19/2019      Reactions   Shellfish-derived Products Nausea And Vomiting   *Mussels*       Medication List    STOP taking these medications   ANTIHISTAMINE PO  TAKE these medications   calcium carbonate 500 MG chewable tablet Commonly known as:  TUMS - dosed in mg elemental calcium Chew 1 tablet by mouth as needed for indigestion or heartburn.   famotidine-calcium carbonate-magnesium hydroxide 10-800-165 MG chewable tablet Commonly known as:  PEPCID COMPLETE Chew 1 tablet by mouth once.   fenofibrate 160 MG tablet Take 1 tablet (160 mg total) by mouth daily.   HYDROcodone-acetaminophen 5-325 MG tablet Commonly known as:  NORCO/VICODIN Take 1-2 tablets by mouth every 4 (four) hours as needed for up to 5 days for moderate pain or severe pain.   metoCLOPramide 5 MG tablet Commonly known as:  REGLAN Take 1 tablet (5 mg total) by mouth 4 (four) times daily -  before meals and at bedtime. for 2 weeks   omeprazole 20 MG tablet Commonly known as:  PRILOSEC OTC Take 20 mg by mouth daily as needed (for acid reflex).   Probiotic Daily Caps Take 1 capsule by mouth once.      Follow-up Information    Cirigliano, Vito V, DO. Schedule an appointment as soon as possible for a visit in 1 week(s).   Specialty:  Gastroenterology Why:  Call tomorrow Contact information: [address] [place] [address] [phone]          Allergies  Allergen Reactions  . Shellfish-Derived Products Nausea And Vomiting    *Mussels*    Consultations:  GI  ID  Procedures/Studies: Ct Abdomen Pelvis W Wo Contrast  Result Date: 01/10/2019 CLINICAL DATA:  28 year old male with history of necrotizing pancreatitis. History of alcohol and substance abuse. Abdominal pain. EXAM: CT ABDOMEN AND PELVIS WITHOUT AND WITH CONTRAST TECHNIQUE: Multidetector CT imaging of the abdomen and pelvis was performed following the standard protocol before and following the bolus administration of intravenous contrast. CONTRAST:  OMNIPAQUE IOHEXOL 300 MG/ML  SOLN COMPARISON:   CT the abdomen and pelvis 01/05/2019. FINDINGS: Lower chest: Large left and small right pleural effusions lying dependently with associated areas of passive subsegmental atelectasis throughout the lower lobes of the lungs bilaterally. Nasogastric tube extending into the stomach. Hepatobiliary: No suspicious cystic or solid hepatic lesions. No intra or extrahepatic biliary ductal dilatation. Amorphous high attenuation material in the gallbladder, likely to reflect biliary sludge. Gallbladder does not appear distended. No calcified gallstones in the gallbladder. Pancreas: No pancreatic mass. Extensive peripancreatic fluid and inflammatory changes again noted. These peripancreatic fluid collections extend all the way around the pancreas as well as adjacent portions of duodenum, with extension laterally throughout the retroperitoneum bilaterally, with some extension caudally into the anatomic pelvis tracking laterally to the psoas musculature. The extent of these collections is very similar to the recent CT 01/05/2019. Pancreatic parenchyma appears to enhance normally. No pancreatic ductal dilatation. Spleen: Unremarkable. Adrenals/Urinary Tract: Bilateral kidneys and adrenal glands are normal in appearance. No hydroureteronephrosis. Urinary bladder is normal in appearance. Stomach/Bowel: Nasogastric tube tip in the body of the stomach. Stomach is deformed related to perigastric fluid collections, most compatible with extensive pancreatic pseudocysts. Some dilated loops of mid small bowel (jejunum) are noted measuring up to 4.7 cm in diameter with some small air-fluid levels, presumably a regional ileus. No pathologic dilatation of distal small bowel or colon. Appendix is not confidently identified may be surgically absent. Vascular/Lymphatic: No significant atherosclerotic disease, aneurysm or dissection noted in the abdominal or pelvic vasculature. No lymphadenopathy noted in the abdomen or pelvis. Reproductive:  Prostate gland and seminal vesicles are unremarkable in appearance. Other:  Small volume of ascites.  No pneumoperitoneum. Musculoskeletal: There are no aggressive appearing lytic or blastic lesions noted in the visualized portions of the skeleton. IMPRESSION: 1. Severe pancreatitis with extensive pseudocyst formation throughout the retroperitoneum and upper abdomen, similar to the prior study, as detailed above. 2. At this time, the pancreatic parenchyma appears to enhance normally. 3. Biliary sludge in the gallbladder without definite findings to suggest an acute cholecystitis at this time. 4. Large left and small left pleural effusions lying dependently with extensive areas of passive atelectasis throughout the lung bases. 5. Small volume of ascites. Electronically Signed   By: Trudie Reed M.D.   On: 01/10/2019 15:49   Dg Chest 1 View  Result Date: 01/16/2019 CLINICAL DATA:  Post left thoracentesis EXAM: CHEST  1 VIEW COMPARISON:  CT 01/15/2019 FINDINGS: Small left pleural effusion remains following thoracentesis. No pneumothorax. Left PICC line is in place with the tip at the cavoatrial junction. Left base atelectasis. Right perihilar atelectasis. Heart is normal size. IMPRESSION: Decreasing left effusion following thoracentesis with small residual left effusion. No pneumothorax. Right perihilar and left basilar atelectasis. Electronically Signed   By: Charlett Nose M.D.   On: 01/16/2019 10:50   Dg Chest 1 View  Result Date: 01/08/2019 CLINICAL DATA:  Status post thoracentesis EXAM: CHEST  1 VIEW COMPARISON:  01/08/2019 FINDINGS: Cardiac shadow is stable. Gastric catheter is noted within the stomach. Left-sided PICC line is noted in the distal superior vena cava reposition when compared with the prior exam. Interval thoracentesis on the left is been performed with decrease in left-sided pleural effusion. Small residual effusions are noted bilaterally. No pneumothorax is seen. IMPRESSION: No  pneumothorax following left thoracentesis. Small residual effusions are noted bilaterally. PICC line is better positioned when compared with the prior exam now in the distal superior vena cava. Electronically Signed   By: Alcide Clever M.D.   On: 01/08/2019 16:00   Dg Chest 2 View  Result Date: 01/11/2019 CLINICAL DATA:  Shortness of breath and chest pain. Re-evaluate pleural effusion. EXAM: CHEST - 2 VIEW COMPARISON:  01/08/2019 FINDINGS: Left PICC line tip at low SVC.  Nasogastric tip at gastric fundus. Midline trachea. Normal heart size. Slight increase in small left pleural effusion. No pneumothorax. Minimal right infrahilar atelectasis. Patchy left base airspace disease. IMPRESSION: Slight increase in small left pleural effusion with persistent adjacent left lower lobe atelectasis. Electronically Signed   By: Jeronimo Greaves M.D.   On: 01/11/2019 10:51   Dg Chest 2 View  Result Date: 01/08/2019 CLINICAL DATA:  Evaluate pleural effusion. EXAM: CHEST - 2 VIEW COMPARISON:  January 05, 2019 FINDINGS: The NG tube terminates in the left upper quadrant. A small to moderate left-sided pleural effusion is stable with underlying opacity, likely atelectasis. A new left PICC line has been inserted. There is an unusual bend in the distal PICC line. The distal end could be flipped back on itself or extending into the azygos vein. Consider repositioning. There may be a small right pleural effusion as well. No other interval changes. IMPRESSION: 1. Small to moderate left-sided pleural effusion with underlying opacity, unchanged. 2. Probable small right effusion. 3. The left PICC line distal tip is flipped back on itself and could be entering the azygos vein. Consider repositioning. The NG tube is in good position. These results will be called to the ordering clinician or representative by the Radiologist Assistant, and communication documented in the PACS or zVision Dashboard. Electronically Signed   By: Gerome Sam  III  M.D   On: 01/08/2019 12:15   Dg Abd 1 View  Result Date: 01/09/2019 CLINICAL DATA:  Abdominal distension. EXAM: ABDOMEN - 1 VIEW COMPARISON:  Radiograph 01/07/2019. CT 01/05/2019 FINDINGS: Similar degree of proximal small bowel dilatation from prior exam. Slight increased distal small bowel gas from prior. Increased air in the transverse colon from prior. No evidence of free air on portable supine view. IMPRESSION: Gaseous proximal small bowel distention is similar to prior exams. Slight increased air in nondilated distal small bowel and colon. Overall findings suggest with ileus rather than obstruction. Electronically Signed   By: Narda Rutherford M.D.   On: 01/09/2019 23:03   Dg Abd 1 View  Result Date: 12/29/2018 CLINICAL DATA:  Evaluate NG tube placement EXAM: ABDOMEN - 1 VIEW COMPARISON:  December 24, 2018 FINDINGS: The NG tube terminates in the gastric antrum. No free air, portal venous gas, or pneumatosis. Dilated loops of small bowel are identified with a paucity of colonic gas. IMPRESSION: 1. The NG tube is in good position. 2. Dilated small bowel loops with a paucity of colonic gas are most consistent with a bowel obstruction. Electronically Signed   By: Gerome Sam III M.D   On: 12/29/2018 14:11   Dg Abd 1 View  Result Date: 12/29/2018 CLINICAL DATA:  Patient with generalized abdominal pain. EXAM: ABDOMEN - 1 VIEW COMPARISON:  CT abdomen pelvis 12/27/2018 FINDINGS: Multiple gaseous distended loops of small bowel are demonstrated within the abdomen measuring up to 5 cm. Small amount of colonic gas is present. Supine evaluation limited for the detection of free intraperitoneal air. IMPRESSION: Gaseous distended loops of small bowel throughout the abdomen with small amount of distal colonic gas. Findings may represent early/partial obstruction or ileus. Electronically Signed   By: Annia Belt M.D.   On: 12/29/2018 10:35   Ct Angio Chest Pe W Or Wo Contrast  Result Date: 01/15/2019 CLINICAL  DATA:  28 year old male with left-sided chest pain history of left exudative pleural effusion thought secondary to recent pancreatitis. EXAM: CT ANGIOGRAPHY CHEST WITH CONTRAST TECHNIQUE: Multidetector CT imaging of the chest was performed using the standard protocol during bolus administration of intravenous contrast. Multiplanar CT image reconstructions and MIPs were obtained to evaluate the vascular anatomy. CONTRAST:  OMNIPAQUE IOHEXOL 350 MG/ML SOLN COMPARISON:  Prior CT scan of the abdomen and pelvis 01/10/2019 FINDINGS: Cardiovascular: Satisfactory opacification of the pulmonary arteries to the segmental level. No evidence of pulmonary embolism. Normal heart size. No pericardial effusion. Left upper extremity approach PICC. Catheter tip terminates in the mid SVC. Mediastinum/Nodes: No enlarged mediastinal, hilar, or axillary lymph nodes. Thyroid gland, trachea, and esophagus demonstrate no significant findings. Lungs/Pleura: Large layering left pleural effusion without evidence of pleural thickening or enhancement. There is associated atelectasis of the left lower lobe. Linear atelectasis in the right lower lobe as well. No focal airspace consolidation, pulmonary edema or pneumothorax. Upper Abdomen: Incompletely imaged perigastric fluid collection, likely related to prior pancreatitis. No new or acute abnormality within the upper abdomen. Musculoskeletal: No acute fracture or aggressive appearing lytic or blastic osseous lesion. Review of the MIP images confirms the above findings. IMPRESSION: 1. Large but simple appearing layering left pleural effusion with associated left lower lobe atelectasis. 2. Segmental right lower lobe atelectasis. 3. Well-positioned left upper extremity approach PICC. Electronically Signed   By: Malachy Moan M.D.   On: 01/15/2019 15:07   Ct Abdomen Pelvis W Contrast  Result Date: 01/05/2019 CLINICAL DATA:  Abdominal pain and  distension. Pancreatitis. EXAM: CT ABDOMEN  AND PELVIS WITH CONTRAST TECHNIQUE: Multidetector CT imaging of the abdomen and pelvis was performed using the standard protocol following bolus administration of intravenous contrast. CONTRAST:  OMNIPAQUE IOHEXOL 300 MG/ML SOLN, 30mL OMNIPAQUE IOHEXOL 300 MG/ML SOLN COMPARISON:  CT abdomen dated 12/27/2018. FINDINGS: Lower chest: New bibasilar pleural effusions, LEFT greater than RIGHT, with associated compressive atelectasis, incompletely imaged. Hepatobiliary: No focal liver abnormality is seen. No gallstones, gallbladder wall thickening, or biliary dilatation. Pancreas: There is now a large amount of free fluid and phlegmonous/inflammatory material surrounding the pancreas with extension into the bilateral paracolic gutters and pelvis. Slightly higher density fluid is seen about the pancreatic body and tail raising the possibility of some intermixed blood products. Spleen: Normal in size without focal abnormality. Adrenals/Urinary Tract: Adrenal glands appear normal. Kidneys are unremarkable without mass, stone or hydronephrosis. Bladder is unremarkable, partially decompressed. Stomach/Bowel: Enteric tube appears well positioned in the stomach. Probable reactive thickening of the walls of the stomach and duodenum. Gaseous distention of the proximal jejunum, distended to approximately 5 cm diameter, presumed associated ileus. Remainder of the large and small bowel is of normal caliber. Vascular/Lymphatic: No significant vascular findings are present. No enlarged abdominal or pelvic lymph nodes. Reproductive: Prostate is unremarkable. Other: No circumscribed fluid collection or abscess like collection identified at this time. No pseudocyst formation at this time. No free intraperitoneal air. Musculoskeletal: No acute or suspicious osseous finding. IMPRESSION: 1. New large amount of free fluid and phlegmonous/inflammatory material surrounding the pancreas, extending into the bilateral paracolic gutters and  pelvis. Slightly higher density fluid is seen about the pancreatic body and tail raising the possibility of some intermixed blood products. No circumscribed fluid collection or abscess like collection identified at this time. 2. New bibasilar pleural effusions, left greater than right (at least moderate in size on the LEFT), with associated compressive atelectasis, incompletely imaged. 3. Gaseous distention of the proximal jejunum, presumably associated ileus. Electronically Signed   By: Bary Richard M.D.   On: 01/05/2019 13:31   Ct Abdomen Pelvis W Contrast  Result Date: 12/28/2018 CLINICAL DATA:  Severe mid abdominal pain and the umbilicus. Tenderness. Nausea, vomiting, and diarrhea. Symptoms for 3 days. EXAM: CT ABDOMEN AND PELVIS WITH CONTRAST TECHNIQUE: Multidetector CT imaging of the abdomen and pelvis was performed using the standard protocol following bolus administration of intravenous contrast. CONTRAST:  OMNIPAQUE IOHEXOL 300 MG/ML  SOLN COMPARISON:  Ultrasound right upper quadrant 12/27/2018 FINDINGS: Lower chest: Lung bases are clear. Hepatobiliary: Diffuse fatty infiltration of the liver. No focal liver lesions. Gallbladder and bile ducts are unremarkable. Pancreas: Infiltration and edema around the head and body of the pancreas. No loculated fluid collections. Pancreatic parenchyma is homogeneous with normal enhancement. Changes are consistent with acute edematous interstitial pancreatitis. No pancreatic ductal dilatation. Spleen: Normal in size without focal abnormality. Adrenals/Urinary Tract: Adrenal glands are unremarkable. Kidneys are normal, without renal calculi, focal lesion, or hydronephrosis. Bladder wall is diffusely thickened possibly indicating cystitis. Stomach/Bowel: Stomach, small bowel, and colon are not abnormally distended. There is mild edema in the colonic wall at the hepatic flexure, likely representing reactive inflammation. Appendix is normal. Vascular/Lymphatic: No  significant vascular findings are present. No enlarged abdominal or pelvic lymph nodes. Reproductive: Prostate is unremarkable. Other: No free air or free fluid in the abdomen. Abdominal wall musculature appears intact. Musculoskeletal: No acute or significant osseous findings. IMPRESSION: 1. Infiltration and edema around the head and body of the pancreas consistent with acute  edematous interstitial pancreatitis. No loculated fluid collections. No evidence of pancreatic necrosis. 2. Diffuse fatty infiltration of the liver. 3. Bladder wall thickening may indicate cystitis. Electronically Signed   By: Burman Nieves M.D.   On: 12/28/2018 00:18   Dg Chest Port 1 View  Result Date: 01/16/2019 CLINICAL DATA:  Left-sided chest pain. Patient underwent thoracentesis today. EXAM: PORTABLE CHEST 1 VIEW COMPARISON:  01/16/2019 at 10:48 a.m. FINDINGS: No change from the earlier exam.  No pneumothorax. Persistent left lung base opacity consistent with a combination of a small residual effusion with either atelectasis or pneumonia. Mild atelectasis extending laterally and inferiorly from the right hilum is also unchanged. No new lung abnormalities. Stable left sided PICC. IMPRESSION: 1. No change from the earlier exam.  No pneumothorax. Electronically Signed   By: Amie Portland M.D.   On: 01/16/2019 13:35   Dg Abd 2 Views  Result Date: 01/06/2019 CLINICAL DATA:  Ileus, enteric tube in place EXAM: ABDOMEN - 2 VIEW COMPARISON:  01/05/2019 CT abdomen/pelvis and abdominal radiographs. FINDINGS: Enteric tube terminates in the body of the stomach. Marked diffuse small bowel dilatation up to 7.7 cm diameter, with air-fluid levels, not substantially changed. Retained oral contrast is noted throughout nondistended large bowel. No evidence of pneumatosis or pneumoperitoneum. Small bilateral pleural effusions, left greater than right, and bibasilar lung opacities appear unchanged. No radiopaque nephrolithiasis. IMPRESSION: No  change in marked diffuse small bowel dilatation with air-fluid levels. Retained oral contrast throughout the nondistended large bowel. Findings are compatible with persistent severe adynamic ileus of the small bowel versus partial distal small bowel obstruction. Small bilateral pleural effusions, left greater than right, with bibasilar lung opacities, unchanged. Electronically Signed   By: Delbert Phenix M.D.   On: 01/06/2019 08:36   Dg Abd 2 Views  Result Date: 01/04/2019 CLINICAL DATA:  Per order: ileus. Pt reported left upper quadrant pain this morning. EXAM: ABDOMEN - 2 VIEW COMPARISON:  the previous day's study FINDINGS: Stable nasogastric tube in the decompressed stomach. Multiple gas dilated small bowel loops with fluid levels, similar degree of dilatation since prior study. The colon appears decompressed. No free air. Regional bones unremarkable. IMPRESSION: 1. Persistent small bowel obstruction. 2. No free air. Electronically Signed   By: Corlis Leak M.D.   On: 01/04/2019 09:40   Dg Abd 2 Views  Result Date: 01/03/2019 CLINICAL DATA:  Abdominal distension EXAM: ABDOMEN - 2 VIEW COMPARISON:  January 02, 2019 FINDINGS: Supine and upright images obtained. Nasogastric tube tip and side port are in the stomach. There remains generalized small bowel dilatation with scattered air-fluid levels in a pattern felt to be indicative bowel obstruction. A small amount of air is noted in the distal sigmoid and rectum. No free air. Small left pleural effusion with left base atelectasis noted. IMPRESSION: Persistent bowel dilatation with air fluid level suggesting persistent obstruction. No free air. Nasogastric tube tip and side port in stomach. Small left pleural effusion with left base atelectasis. Electronically Signed   By: Bretta Bang III M.D.   On: 01/03/2019 08:21   Dg Abd Acute 2+v W 1v Chest  Result Date: 01/05/2019 CLINICAL DATA:  Ileus, NG tube, Abdomen pain/swelling EXAM: DG ABDOMEN ACUTE W/ 1V  CHEST COMPARISON:  the previous day's exam, and earlier studies FINDINGS: Heart size normal. Left pleural effusion, and consolidation/atelectasis at the left lung base, which have developed since CT 12/27/2018. Nasogastric tube to the decompressed stomach. Dilated small bowel loops with fluid levels, similar in number and  degree of dilatation since prior study. Colon decompressed. Left pelvic phlebolith. Regional bones unremarkable. IMPRESSION: 1. Nasogastric tube to the stomach. 2. Persistent small bowel obstruction. 3. Left lower lung atelectasis/consolidation with effusion Electronically Signed   By: Corlis Leak M.D.   On: 01/05/2019 08:59   Dg Abd Portable 1v  Result Date: 01/10/2019 CLINICAL DATA:  28 year old male NG tube placement. EXAM: PORTABLE ABDOMEN - 1 VIEW COMPARISON:  2227 hours the same day. FINDINGS: Portable AP supine view at 2328 hours. Enteric tube has been placed into the left upper quadrant with side hole at the level of the gastric body. Continued abnormal gas pattern with dilated small bowel loops up to 8 centimeters diameter, gas in nondilated ascending and transverse colon. No osseous abnormality identified. Partial left lung base opacity redemonstrated. No pneumoperitoneum evident on this supine image. IMPRESSION: 1. Enteric tube placed into the stomach, side hole the level of the gastric body. 2. Abnormal bowel-gas pattern not significantly changed. Electronically Signed   By: Odessa Fleming M.D.   On: 01/10/2019 00:10   Dg Abd Portable 1v  Result Date: 01/07/2019 CLINICAL DATA:  Ileus EXAM: PORTABLE ABDOMEN - 1 VIEW COMPARISON:  January 06, 2019 FINDINGS: Nasogastric tube tip is at the superior most aspect of the image with tip in stomach. There remain multiple loops of dilated small bowel. A few foci of air within colon noted. There is no longer contrast seen in the colon. No free air evident. IMPRESSION: Persistent small bowel dilatation. Question ileus versus a degree of bowel  obstruction. Nasogastric tube tip in stomach, seen along the superior most aspect of the image. No free air demonstrable. Electronically Signed   By: Bretta Bang III M.D.   On: 01/07/2019 10:47   Dg Abd Portable 2v  Result Date: 01/02/2019 CLINICAL DATA:  Ileus. EXAM: PORTABLE ABDOMEN - 2 VIEW COMPARISON:  01/01/2019.  12/31/2018. FINDINGS: NG tube noted with tip over the stomach in stable position. Soft tissue structures are unremarkable. Severe distention of small bowel loops again noted. Relative paucity of colonic gas noted. Stool in the colon. Findings again worrisome for small bowel obstruction. No significant interim change noted from prior exam. IMPRESSION: NG tube noted with tip over the stomach in stable position. Severe distention of small bowel loops worrisome for small bowel obstruction again noted. No significant interim change from prior exam. Electronically Signed   By: Maisie Fus  Register   On: 01/02/2019 06:05   Dg Abd Portable 2v  Result Date: 01/01/2019 CLINICAL DATA:  Follow-up ileus. EXAM: PORTABLE ABDOMEN - 2 VIEW COMPARISON:  Abdominal x-rays from yesterday. FINDINGS: Unchanged enteric tube. Interval decrease in the number, but not the caliber of small bowel loops, which remain severely dilated centrally. A small amount of stool is seen in the right and left colon and rectum. No pneumoperitoneum. No acute osseous abnormality. IMPRESSION: 1. Persistent severe small bowel dilatation, although the number of dilated small bowel loops has decreased when compared to prior study. Electronically Signed   By: Obie Dredge M.D.   On: 01/01/2019 14:55   Dg Abd Portable 2v  Result Date: 12/31/2018 CLINICAL DATA:  Distended abdomen. Ileus versus small bowel obstruction. EXAM: PORTABLE ABDOMEN - 2 VIEW COMPARISON:  12/30/2018. FINDINGS: NG tube noted coiled stomach in stable position. Persistent severe distention of small bowel loops. Small bowel loops measure up to 6 cm in diameter.  There is relative paucity of intra colonic gas. Small amount of stool in the right colon. Findings again suggest  small bowel obstruction. No free air. IMPRESSION: 1.  NG tube in stable position. 2. Persistent severe distention of small bowel loops with relative paucity of intra colonic gas. Findings worrisome for small bowel obstruction. Electronically Signed   By: Maisie Fus  Register   On: 12/31/2018 10:29   Dg Abd Portable 2v  Result Date: 12/30/2018 CLINICAL DATA:  Abdominal distension in a patient with pancreatitis. EXAM: PORTABLE ABDOMEN - 2 VIEW COMPARISON:  Single-view of the abdomen 12/29/2018. FINDINGS: NG tube remains in place. Gaseous distention of small bowel persists without marked change. Only small volume of stool is seen in the ascending colon. IMPRESSION: Unchanged gaseous with little stool in the colon most worrisome distention of small bowel for obstruction. Electronically Signed   By: Drusilla Kanner M.D.   On: 12/30/2018 13:30   [place] Ekg Site Rite  Result Date: 01/05/2019 If Site Rite image not attached, placement could not be confirmed due to current cardiac rhythm.  US Abdomen Limited Ruq  Result Date: 12/27/2018 CLINICAL DATA:  28 year old male with right upper quadrant abdominal pain for 2 months, but progressed in the past 2 days. EXAM: ULTRASOUND ABDOMEN LIMITED RIGHT UPPER QUADRANT COMPARISON:  None. FINDINGS: Gallbladder: No gallstones or wall thickening visualized. No sonographic Murphy sign noted by sonographer. Common bile duct: Diameter: 3 millimeters, normal. Liver: Echogenic liver (image 15). No discrete liver lesion. No intrahepatic biliary ductal dilatation. Portal vein is patent on color Doppler imaging with normal direction of blood flow towards the liver. Other findings: Negative visible right kidney. IMPRESSION: 1. Hepatic steatosis. 2. Negative gallbladder.  No evidence of biliary obstruction. Electronically Signed   By: Odessa Fleming M.D.   On: 12/27/2018 23:26   US  Thoracentesis Asp Pleural Space W/img Guide  Result Date: 01/16/2019 INDICATION: Patient with acute necrotizing pancreatitis secondary to ETOH abuse and severe hypertriglyceridemia with left sided chest pain, dyspnea with exertion and fever. Imaging shows recurrent left pleural effusion. Request for diagnostic and therapeutic thoracentesis today in IR. EXAM: ULTRASOUND GUIDED LEFT THORACENTESIS MEDICATIONS: 10 mL 1% lidocaine COMPLICATIONS: None immediate. PROCEDURE: An ultrasound guided thoracentesis was thoroughly discussed with the patient and questions answered. The benefits, risks, alternatives and complications were also discussed. The patient understands and wishes to proceed with the procedure. Written consent was obtained. Ultrasound was performed to localize and mark an adequate pocket of fluid in the left chest. The area was then prepped and draped in the normal sterile fashion. 1% Lidocaine was used for local anesthesia. Under ultrasound guidance a 6 Fr Safe-T-Centesis catheter was introduced. Thoracentesis was performed. The catheter was removed and a dressing applied. FINDINGS: A total of approximately 1.3 L of dark golden fluid was removed - patient developed symptomatic hypotension during procedure and as such procedure was aborted, residual fluid remains on post procedure ultrasound. Samples were sent to the laboratory as requested by the clinical team. IMPRESSION: Successful ultrasound guided left thoracentesis yielding 1.3 L of pleural fluid. Read by Lynnette Caffey, PA-C Electronically Signed   By: Gilmer Mor D.O.   On: 01/16/2019 10:50   US Thoracentesis Asp Pleural Space W/img Guide  Result Date: 01/08/2019 INDICATION: Patient with history of abdominal pain, alcoholic pancreatitis, left pleural effusion. Request made for diagnostic and therapeutic left thoracentesis. EXAM: ULTRASOUND GUIDED DIAGNOSTIC AND THERAPEUTIC LEFT THORACENTESIS MEDICATIONS: None COMPLICATIONS: None immediate.  PROCEDURE: An ultrasound guided thoracentesis was thoroughly discussed with the patient and questions answered. The benefits, risks, alternatives and complications were also discussed. The patient understands and wishes to  proceed with the procedure. Written consent was obtained. Ultrasound was performed to localize and mark an adequate pocket of fluid in the left chest. The area was then prepped and draped in the normal sterile fashion. 1% Lidocaine was used for local anesthesia. Under ultrasound guidance a 6 Fr Safe-T-Centesis catheter was introduced. Thoracentesis was performed. The catheter was removed and a dressing applied. FINDINGS: A total of approximately 640 cc of hazy, dark amber/tea-colored fluid was removed. Samples were sent to the laboratory as requested by the clinical team. IMPRESSION: Successful ultrasound guided diagnostic and therapeutic left thoracentesis yielding 640 cc of pleural fluid. Read by: Jeananne RamaKevin Allred, PA-C Electronically Signed   By: Richarda OverlieAdam  Henn M.D.   On: 01/08/2019 15:49    Subjective: Wants to go home. Walked >17500ft with some dyspnea but improved significantly from prior attempts. No hypoxia during this, though HR did go up it returned to low 100's. Has intermittent left pleuritic chest pain. Abdominal pain is resolved and distention is going down. No fevers, cough.  Discharge Exam: Vitals:   01/18/19 2137 01/19/19 0527  BP: (!) 142/86 125/83  Pulse: (!) 124 (!) 102  Resp: 17 18  Temp: 98.3 F (36.8 C) 98 F (36.7 C)  SpO2: 97% 99%   General: Pt is alert, awake, not in acute distress Cardiovascular: RRR, S1/S2 +, no rubs, no gallops Respiratory: CTA bilaterally, no wheezing, no rhonchi Abdominal: Soft, NT, mildly distended, bowel sounds + Extremities: No edema, no cyanosis  Labs: Basic Metabolic Panel: Recent Labs  Lab 01/13/19 0314 01/14/19 0433 01/15/19 0351 01/16/19 0324 01/17/19 0435  NA 133* 132* 134* 134* 136  K 4.3 4.1 3.9 4.0 4.5  CL 101 100  100 102 103  CO2 23 24 23 24 25   GLUCOSE 105* 111* 107* 99 114*  BUN 18 19 17 15 16   CREATININE 0.64 0.55* 0.59* 0.52* 0.50*  CALCIUM 8.4* 8.1* 8.0* 8.0* 8.1*  MG 2.3 2.1 2.3 2.1 2.1  PHOS 4.8* 4.9* 4.8* 5.0* 4.4   Liver Function Tests: Recent Labs  Lab 01/13/19 0314 01/14/19 0433 01/15/19 0351 01/16/19 0324  AST 85* 66* 88* 86*  ALT 148* 118* 131* 129*  ALKPHOS 126 146* 175* 186*  BILITOT 2.2* 1.6* 1.5* 1.1  PROT 6.1* 5.8* 5.9* 5.4*  ALBUMIN 2.5* 2.4* 2.3* 2.2*   CBC: Recent Labs  Lab 01/13/19 0314 01/14/19 0433 01/15/19 0351 01/18/19 0409 01/18/19 0840  WBC 12.7* 11.8* 10.6* 9.3 9.3  NEUTROABS 9.9*  --   --   --   --   HGB 8.7* 8.4* 8.2* 8.1* 8.9*  HCT 28.6* 26.9* 27.5* 26.2* 28.6*  MCV 99.0 97.1 99.3 96.7 96.3  PLT 611* 526* 512* 496* 509*   Lipid Profile Recent Labs    01/18/19 0409 01/19/19 0313  TRIG 170* 184*   Urinalysis    Component Value Date/Time   COLORURINE AMBER (A) 01/05/2019 1055   APPEARANCEUR CLEAR 01/05/2019 1055   LABSPEC 1.020 01/05/2019 1055   PHURINE 5.0 01/05/2019 1055   GLUCOSEU NEGATIVE 01/05/2019 1055   HGBUR NEGATIVE 01/05/2019 1055   BILIRUBINUR NEGATIVE 01/05/2019 1055   KETONESUR 80 (A) 01/05/2019 1055   PROTEINUR NEGATIVE 01/05/2019 1055   NITRITE NEGATIVE 01/05/2019 1055   LEUKOCYTESUR NEGATIVE 01/05/2019 1055   Microbiology Recent Results (from the past 240 hour(s))  Culture, blood (routine x 2)     Status: None (Preliminary result)   Collection Time: 01/16/19  8:55 AM  Result Value Ref Range Status   Specimen Description  Final    BLOOD RIGHT HAND Performed at [facility], [address], [place], [place] [address]    Special Requests   Final    BOTTLES DRAWN AEROBIC ONLY Blood Culture adequate volume Performed at [facility], [address], [place], [place] [address]    Culture   Final    NO GROWTH 2 DAYS Performed at [facility], [address],  [place], [place] [address]    Report Status PENDING  Incomplete  Culture, blood (routine x 2)     Status: None (Preliminary result)   Collection Time: 01/16/19  8:55 AM  Result Value Ref Range Status   Specimen Description   Final    BLOOD RIGHT HAND Performed at [facility], [address] [place], [place] [address]    Special Requests   Final    BOTTLES DRAWN AEROBIC AND ANAEROBIC Blood Culture adequate volume Performed at [facility], [address], [place], [place] [address]    Culture   Final    NO GROWTH 2 DAYS Performed at [facility], [address], [place], [place] [address]    Report Status PENDING  Incomplete  Body fluid culture     Status: None   Collection Time: 01/16/19 11:09 AM  Result Value Ref Range Status   Specimen Description   Final    PLEURAL LEFT Performed at [facility], [address], [place], [place] [address]    Special Requests   Final    NONE Performed at [facility], [address] [place], [place] [address]    Gram Stain FEW WBC SEEN NO ORGANISMS SEEN   Final   Culture   Final    NO GROWTH 3 DAYS Performed at [facility], [address], [place], [place] [address]    Report Status 01/19/2019 FINAL  Final    Time coordinating discharge: Approximately 40 minutes  Tyrone Nine, MD  [facility] 01/19/2019, 2:14 PM

## 2019-01-21 LAB — CULTURE, BLOOD (ROUTINE X 2)
Culture: NO GROWTH
Culture: NO GROWTH
Special Requests: ADEQUATE
Special Requests: ADEQUATE

## 2019-01-23 ENCOUNTER — Other Ambulatory Visit: Payer: Self-pay

## 2019-01-23 ENCOUNTER — Encounter: Payer: Self-pay | Admitting: Gastroenterology

## 2019-01-23 ENCOUNTER — Telehealth (INDEPENDENT_AMBULATORY_CARE_PROVIDER_SITE_OTHER): Payer: Federal, State, Local not specified - PPO | Admitting: Gastroenterology

## 2019-01-23 VITALS — Ht 70.0 in | Wt 155.0 lb

## 2019-01-23 DIAGNOSIS — K858 Other acute pancreatitis without necrosis or infection: Secondary | ICD-10-CM | POA: Diagnosis not present

## 2019-01-23 DIAGNOSIS — F17200 Nicotine dependence, unspecified, uncomplicated: Secondary | ICD-10-CM

## 2019-01-23 DIAGNOSIS — K8591 Acute pancreatitis with uninfected necrosis, unspecified: Secondary | ICD-10-CM | POA: Diagnosis not present

## 2019-01-23 DIAGNOSIS — E781 Pure hyperglyceridemia: Secondary | ICD-10-CM

## 2019-01-23 DIAGNOSIS — F1010 Alcohol abuse, uncomplicated: Secondary | ICD-10-CM

## 2019-01-23 DIAGNOSIS — R1084 Generalized abdominal pain: Secondary | ICD-10-CM | POA: Diagnosis not present

## 2019-01-23 DIAGNOSIS — J90 Pleural effusion, not elsewhere classified: Secondary | ICD-10-CM

## 2019-01-23 DIAGNOSIS — K567 Ileus, unspecified: Secondary | ICD-10-CM

## 2019-01-23 DIAGNOSIS — R5381 Other malaise: Secondary | ICD-10-CM

## 2019-01-23 DIAGNOSIS — K863 Pseudocyst of pancreas: Secondary | ICD-10-CM

## 2019-01-23 DIAGNOSIS — Z87898 Personal history of other specified conditions: Secondary | ICD-10-CM

## 2019-01-23 DIAGNOSIS — R188 Other ascites: Secondary | ICD-10-CM

## 2019-01-23 MED ORDER — HYDROCODONE-ACETAMINOPHEN 5-325 MG PO TABS: 1.0000 | ORAL_TABLET | Freq: Four times a day (QID) | ORAL | 0 refills | Status: DC | PRN

## 2019-01-23 NOTE — Progress Notes (Signed)
Chief Complaint: Hospital follow-up, Hypertriglyceride Pancreatitis, Alcohol use disorder  Referring Provider:     Tyrone Nine, MD (Hospitalist)   HPI:    Due to current restrictions/limitations of in-office visits due to the COVID-19 pandemic, this scheduled clinical appointment was converted to a telehealth virtual consultation.   -Time of direct medical discussion: 30 minutes -The patient did consent to this virtual visit and is aware of possible charges through their insurance for this visit.  -Names of all parties present: Charles Dougherty (patient), Doristine Locks, DO, [facility] (physician) -Patient location: Home -Physician location: Office  Charles Dougherty is a 28 y.o. male referred to the [facility] for hospital follow-up.  Hospitalized 12/27/2018-01/19/2019 with acute necrotizing pancreatitis 2/2 alcohol abuse and hypertriglyceridemia with TG >5000 on admission.  Pancreatitis complicated by left exudative pleural effusion (thoracentesis x2, antibiotics), prolonged small bowel ileus, conservatively managed with NG tube decompression, prolonged n.p.o.,  TPN, treated with insulin gtt. and aggressive electrolyte repletion.  CT on 3/28 with severe pancreatitis and extensive pseudocyst formation through the retroperitoneum and upper abdomen.  Additionally with anemia requiring 2U PRBCs with consideration for EGD as an outpatient.  FOBT negative.  Today he states he is doing well since d/c. Improved PO intake and 1 formed stool/day. No hematochezia or melena. Walking around house and neighboorhood, with slowly improving activity tolerance. Still with abdominal distension, but overall unchanged from hospitalization.  Abdomen not tense.  Intermittent abdominal pain, particularly at night.  Pain generalized throughout abdomen, but most pronounced periumbilical, similar in location and quality to day of hospital discharge.  Pain responsive to prescribed Vicodin. Trying to limit  pain medication use for this, but typically taking every 6 hours or so.  Still with lower chest discomfort with deep inspiration.  Otherwise, afebrile, no nausea, vomiting.  No LE edema.  Discharged with Reglan, fenofibrate, ibuprofen, Vicodin, Pepcid, omeprazole, probiotic. No new labs since discharge on 01/19/2019 for review today.  TG at discharge was 184.  Needs to establish PCM.  Past medical history, past surgical history, social history, family history, medications, and allergies reviewed in the chart and with patient over Zoom.   Past Medical History:  Diagnosis Date  . Allergy   . Asthma   . Atopic eczema   . Cystitis 12/28/2018  . Drug abuse (HCC) 12/28/2018  . ETOH abuse 12/28/2018     History reviewed. No pertinent surgical history. Family History  Problem Relation Age of Onset  . Hypertension Mother   . Hyperlipidemia Mother   . Nephrolithiasis Father   . GER disease Brother    Social History   Tobacco Use  . Smoking status: Current Every Day Smoker    Types: Cigarettes  . Smokeless tobacco: Never Used  Substance Use Topics  . Alcohol use: Not Currently    Comment: weekly  . Drug use: Yes    Types: Marijuana   Current Outpatient Medications  Medication Sig Dispense Refill  . calcium carbonate (TUMS - DOSED IN MG ELEMENTAL CALCIUM) 500 MG chewable tablet Chew 1 tablet by mouth as needed for indigestion or heartburn.    . diphenhydrAMINE (BENADRYL ALLERGY) 25 mg capsule Take 25 mg by mouth every 6 (six) hours as needed (for sleep).    . famotidine-calcium carbonate-magnesium hydroxide (PEPCID COMPLETE) 10-800-165 MG chewable tablet Chew 1 tablet by mouth once.    . fenofibrate 160 MG tablet Take 1 tablet (160 mg total) by mouth daily. 30  tablet 0  . HYDROcodone-acetaminophen (NORCO/VICODIN) 5-325 MG tablet Take 1-2 tablets by mouth every 4 (four) hours as needed for up to 5 days for moderate pain or severe pain. 20 tablet 0  . metoCLOPramide (REGLAN) 5 MG tablet  Take 1 tablet (5 mg total) by mouth 4 (four) times daily -  before meals and at bedtime. for 2 weeks 60 tablet 0  . omeprazole (PRILOSEC OTC) 20 MG tablet Take 20 mg by mouth daily.     . Probiotic Product (PROBIOTIC DAILY) CAPS Take 1 capsule by mouth once.     No current facility-administered medications for this visit.    Allergies  Allergen Reactions  . Shellfish-Derived Products Nausea And Vomiting    *Mussels*     Review of Systems: All systems reviewed and negative except where noted in HPI.     Physical Exam:    Physical exam not completed due to the nature of this telehealth communication.  Patient was otherwise alert and oriented and well communicative.   ASSESSMENT AND PLAN;   1) Acute Necrotizing Pancreatitis 2) Hypertriglyceridemia 3) Pancreatic Pseudocyst 4) Small bowel ileus (2/2 Acute Pancreatitis) 5) Exudative pleural effusion 6) Deconditioning 7) Tobacco use disorder 8) Alcohol abuse disorder 9) Abdominal Ascites 10) Heartburn  -Abdominal ultrasound now to evaluate for ascites that may be amenable to short-term of diuretic therapy vs paracentesis versus his abdominal distention being only from known large pancreatic pseudocyst -Provided with a refill of his Vicodin for suspected ongoing pancreatic pseudocyst pain.  Provided hydrocodone-acetaminophen 5/325 mg take 1 tab p.o. as needed every 6 hours for pain, #28, RF0.  Explained that this is a 1 week supply, and I am unable to go beyond the 1 week.  If he still having ongoing pain, may be able to discuss with either PCM or can get referral to pain management for assistance.  He understands the limitations of opioid prescriptions and will continue to wean pain meds and replaced with APAP alone -Repeat CT abdomen/pelvis with contrast in 6 weeks to evaluate for pseudocyst resorption versus thick wall that may be amenable to cyst gastrostomy - Endocrinology referral for severe hypertriglyceridemia - Placed  referral to establish new PCM - Continue to slowly advance diet as tolerated - Continue complete abstinence of all alcohol.  He cites a strong social support network with family and his girlfriend also completely stopped drinking to support him.  Discussed AA and other cessation avenues available to him. - Continue to wean tobacco and eventually quit for overall improved mortality -Repeat labs in 3 months to establish whether or not he has any impaired hepatic synthetic function - Okay to continue Reglan in the short-term as a promotility agent.  Plan to wean in 2 weeks -Ileus resolved.  Tolerating p.o. intake and regular bowel habits. -Resume fenofibrate as prescribed -Resume PPI for pre-existing reflux symptoms - Pulmonary referral re: exudative pleural effusions and ongoing chest discomfort - RTC in 1 month or sooner as needed  This encounter had more than 25 minutes of immediate intra-service time, with over 40 minutes of total time.   Verlin DikeVito V Tony Friscia, DO, FACG  01/23/2019, 1:49 PM   No ref. provider found

## 2019-01-23 NOTE — Patient Instructions (Addendum)
You have been scheduled for an abdominal ultrasound at [facility]  (1st floor of hospital) on 01/24/2019 at 9am. Please arrive 15 minutes prior to your appointment for registration. Make certain not to have anything to eat or drink 6 hours prior to your appointment. Should you need to reschedule your appointment, please contact radiology at [phone]. This test typically takes about 30 minutes to perform.  We will refer you to Endocrinology   We will refer you to Pulmonology   We will refer you to a Primary Care Provider  We have sent Vicodin to your pharmacy [facility] on [address]   You will need a follow up CT abdomen/pelvis with contrast in 6 weeks  You have a follow up appointment with Dr Barron Alvine on 02/25/2019 at 11am

## 2019-01-24 ENCOUNTER — Ambulatory Visit (HOSPITAL_BASED_OUTPATIENT_CLINIC_OR_DEPARTMENT_OTHER)
Admission: RE | Admit: 2019-01-24 | Discharge: 2019-01-24 | Disposition: A | Discharge status: Home / Self Care | Payer: Federal, State, Local not specified - PPO | Source: Ambulatory Visit | Attending: Gastroenterology | Admitting: Gastroenterology

## 2019-01-24 DIAGNOSIS — J90 Pleural effusion, not elsewhere classified: Secondary | ICD-10-CM | POA: Diagnosis not present

## 2019-01-24 DIAGNOSIS — F1010 Alcohol abuse, uncomplicated: Secondary | ICD-10-CM | POA: Diagnosis not present

## 2019-01-24 DIAGNOSIS — K863 Pseudocyst of pancreas: Secondary | ICD-10-CM | POA: Insufficient documentation

## 2019-01-24 DIAGNOSIS — R109 Unspecified abdominal pain: Secondary | ICD-10-CM | POA: Diagnosis not present

## 2019-01-24 DIAGNOSIS — K858 Other acute pancreatitis without necrosis or infection: Secondary | ICD-10-CM | POA: Diagnosis not present

## 2019-01-24 DIAGNOSIS — R188 Other ascites: Secondary | ICD-10-CM | POA: Diagnosis not present

## 2019-01-24 DIAGNOSIS — R1084 Generalized abdominal pain: Secondary | ICD-10-CM | POA: Insufficient documentation

## 2019-01-27 ENCOUNTER — Telehealth: Payer: Self-pay | Admitting: *Deleted

## 2019-01-27 ENCOUNTER — Other Ambulatory Visit: Payer: Self-pay | Admitting: *Deleted

## 2019-01-27 DIAGNOSIS — Z7689 Persons encountering health services in other specified circumstances: Secondary | ICD-10-CM

## 2019-01-27 DIAGNOSIS — F1010 Alcohol abuse, uncomplicated: Secondary | ICD-10-CM

## 2019-01-27 DIAGNOSIS — K858 Other acute pancreatitis without necrosis or infection: Secondary | ICD-10-CM

## 2019-01-27 DIAGNOSIS — K863 Pseudocyst of pancreas: Secondary | ICD-10-CM

## 2019-01-27 DIAGNOSIS — J90 Pleural effusion, not elsewhere classified: Secondary | ICD-10-CM

## 2019-01-27 NOTE — Telephone Encounter (Signed)
Per [facility]  Send all [place] referrals through Epic   Referrals sent today

## 2019-02-05 ENCOUNTER — Encounter: Payer: Self-pay | Admitting: Family Medicine

## 2019-02-05 ENCOUNTER — Ambulatory Visit (INDEPENDENT_AMBULATORY_CARE_PROVIDER_SITE_OTHER): Payer: Federal, State, Local not specified - PPO | Admitting: Family Medicine

## 2019-02-05 VITALS — Wt 155.0 lb

## 2019-02-05 DIAGNOSIS — E781 Pure hyperglyceridemia: Secondary | ICD-10-CM

## 2019-02-05 DIAGNOSIS — F1010 Alcohol abuse, uncomplicated: Secondary | ICD-10-CM | POA: Diagnosis not present

## 2019-02-05 DIAGNOSIS — Z8249 Family history of ischemic heart disease and other diseases of the circulatory system: Secondary | ICD-10-CM

## 2019-02-05 DIAGNOSIS — R030 Elevated blood-pressure reading, without diagnosis of hypertension: Secondary | ICD-10-CM

## 2019-02-05 DIAGNOSIS — K863 Pseudocyst of pancreas: Secondary | ICD-10-CM

## 2019-02-05 DIAGNOSIS — Z720 Tobacco use: Secondary | ICD-10-CM

## 2019-02-05 DIAGNOSIS — Z8719 Personal history of other diseases of the digestive system: Secondary | ICD-10-CM

## 2019-02-05 DIAGNOSIS — Z7689 Persons encountering health services in other specified circumstances: Secondary | ICD-10-CM

## 2019-02-05 MED ORDER — FENOFIBRATE 160 MG PO TABS: 160.0000 mg | ORAL_TABLET | Freq: Every day | ORAL | 3 refills | Status: DC

## 2019-02-05 MED ORDER — TRAMADOL HCL 50 MG PO TABS: ORAL_TABLET | ORAL | 0 refills | Status: DC

## 2019-02-05 NOTE — Progress Notes (Addendum)
Virtual Visit via Video Note  I connected with Charles Dougherty on 02/05/19 at  9:00 AM EDT by a video enabled telemedicine application and verified that I am speaking with the correct person using two identifiers. Location patient: home Location provider: home office Persons participating in the virtual visit: patient, provider  I discussed the limitations of evaluation and management by telemedicine and the availability of in person appointments. The patient expressed understanding and agreed to proceed.  Interactive audio and video telecommunications were attempted between myself and the patient, however failed, due to the patient having technical difficulties. We continued and completed the visit with audio only.  Chief Complaint  Patient presents with  . Establish Care    est care/ hosp f/u/ pain in left shoulder goes up through neck and down arm( happens when eating)-- sharp pain.     HPI: Charles Dougherty is a 28 y.o. male to establish care with our office. He has not had a PCP in 7 years. He was recently hospitalized at [facility] from 3/13-4/5 for alcohol-induced necrotizing pancreatitis, complicated by left plueral effusion, small bowel ileus that responded to conservative management, and anemia requiring transfusion. His TG were significantly elevated (>5,000) but improved with treatment to 184 at discharge. Pt is taking fenofibrate 160mg  daily. Pt followed up with GI ([facility] Dr. Doristine Locks) on 4/9 for his resolving pancreatitis, abdominal pain, large pancreatic pseudocyst. Plan was to repeat CT in 6 wks to re-eval pseudocyst and determine treatment plan.   Today, pt states he continues to feel better and "clearer" each day. He is eating at least 3 meals per day, moving his bowels regularly. He has a few tabs left of reglan but plans to stop taking this once Rx runs out. He is taking PPI daily.  He walks around his neighborhood after meals and notes being able to walk further every few days. He  is taking hydrocodone BID for pain. He feels his pain has improved compared to 1-2 wks ago but still notes pain in the early AM and overnight. He understands long term narcotics are not a good idea and is agreeable to tapering off over next 1-2 wks to OTC med like ibuprofen or tylenol.  Pt denies any alcohol intake since prior to his hospital admission on 12/27/18. His family and live-in girlfriend are very supportive of his efforts. Pt states he "has no desire to go back to it" and "feels better now that [I'm} sober". He has reached out to a couple of alcohol recovery programs and plans to attend AA once in-person meetings resume. He is still smoking tobacco but plans to continue to wean down his number of cigarettes per day.  Pt has elevated BP in the hospital and has checked his BP at home a few times. He did not write these readings down but believes readings were about 150/90-100 with HR 90-100. He is not currently taking any BP meds. He states he is eating much healthier - smaller portions overall and more vegetables, fruits, smoothies with protein - and is walking multiple times per day. He had a fam h/o HTN in his mother and other maternal relatives.   He has appt with pulmonary tomorrow (d/t h/o left exudative pleural effusion, pneumonia) and GI on 02/25/19.   Past Medical History:  Diagnosis Date  . Allergy   . Asthma   . Atopic eczema   . Cystitis 12/28/2018  . Drug abuse (HCC) 12/28/2018  . ETOH abuse 12/28/2018    History reviewed.  No pertinent surgical history.  Family History  Problem Relation Age of Onset  . Hypertension Mother   . Hyperlipidemia Mother   . Nephrolithiasis Father   . GER disease Brother     Social History   Tobacco Use  . Smoking status: Current Every Day Smoker    Types: Cigarettes  . Smokeless tobacco: Never Used  Substance Use Topics  . Alcohol use: Not Currently    Comment: weekly  . Drug use: Yes    Types: Marijuana     Current Outpatient  Medications:  .  calcium carbonate (TUMS - DOSED IN MG ELEMENTAL CALCIUM) 500 MG chewable tablet, Chew 1 tablet by mouth as needed for indigestion or heartburn., Disp: , Rfl:  .  famotidine-calcium carbonate-magnesium hydroxide (PEPCID COMPLETE) 10-800-165 MG chewable tablet, Chew 1 tablet by mouth once., Disp: , Rfl:  .  fenofibrate 160 MG tablet, Take 1 tablet (160 mg total) by mouth daily., Disp: 30 tablet, Rfl: 0 .  metoCLOPramide (REGLAN) 5 MG tablet, Take 1 tablet (5 mg total) by mouth 4 (four) times daily -  before meals and at bedtime. for 2 weeks, Disp: 60 tablet, Rfl: 0 .  omeprazole (PRILOSEC OTC) 20 MG tablet, Take 20 mg by mouth daily. , Disp: , Rfl:  .  Probiotic Product (PROBIOTIC DAILY) CAPS, Take 1 capsule by mouth once., Disp: , Rfl:  .  diphenhydrAMINE (BENADRYL ALLERGY) 25 mg capsule, Take 25 mg by mouth every 6 (six) hours as needed (for sleep)., Disp: , Rfl:   Allergies  Allergen Reactions  . Shellfish-Derived Products Nausea And Vomiting    *Mussels*      ROS: See pertinent positives and negatives per HPI.   EXAM:  VITALS per patient if applicable:  GENERAL: alert, oriented, appears well and in no acute distress  NECK: normal movements of the head and neck  LUNGS: on inspection no signs of respiratory distress, breathing rate appears normal, no obvious gross SOB, gasping or wheezing, no conversational dyspnea  CV: no obvious cyanosis  PSYCH/NEURO: pleasant and cooperative, speech and thought processing grossly intact   ASSESSMENT AND PLAN: 1. Encounter to establish care with new doctor  2. Alcohol abuse - congratulated pt on quitting since 12/26/18 - encouraged him to establish with AA or other alcohol cessation program and would suspect they are doing virtual meetings during covid-19 pandemic. Stressed importance of family support, which pt has, as well as support from professional source/program. Pt agrees to look into this further.  3. Elevated BP  without diagnosis of hypertension 4. Family history of hypertension in mother - pt to check BP daily x 1 week and keep log - discussed importance of monitoring sodium intake and following low sodium diet - f/u appt in 1 week; if BP >140/>90, will likely initiate anti-HTN med  5. Hypertriglyceridemia - TG much-improved when last checked on 4/5 Refill: - fenofibrate 160 MG tablet; Take 1 tablet (160 mg total) by mouth daily.  Dispense: 90 tablet; Refill: 3 - cont improved diet and regular CV exercise - will plan to recheck in 3-43mo  6. Tobacco use - pt has cut back and plans to continue to do so - will discuss possible Rx at next OV in 1 week  7. History of acute pancreatitis 8. Pancreatic pseudocyst - pain and GI symptoms improving but still present. Pt is taking hydrocodone 1 tab BID which alleviates the pain. I expressed goal of weaning down to OTC pain meds over the next 2 wks  and pt is agreeable. D/c hydrocodone. Will Rx ultram x 1 week then re-eval with plan to use OTC NSAID or APAP at that time. Rx: - traMADol (ULTRAM) 50 MG tablet; 1-2 tabs po q6 PRN pain  Dispense: 28 tablet; Refill: 0 - GI f/u scheduled for 02/25/19    I discussed the assessment and treatment plan with the patient. The patient was provided an opportunity to ask questions and all were answered. The patient agreed with the plan and demonstrated an understanding of the instructions.   The patient was advised to call back or seek an in-person evaluation if the symptoms worsen or if the condition fails to improve as anticipated.   Luana ShuMary Levent Kornegay, DO

## 2019-02-06 ENCOUNTER — Telehealth: Payer: Self-pay | Admitting: Internal Medicine

## 2019-02-06 ENCOUNTER — Other Ambulatory Visit: Payer: Self-pay

## 2019-02-06 ENCOUNTER — Encounter: Payer: Self-pay | Admitting: Internal Medicine

## 2019-02-06 ENCOUNTER — Ambulatory Visit (INDEPENDENT_AMBULATORY_CARE_PROVIDER_SITE_OTHER): Payer: Federal, State, Local not specified - PPO

## 2019-02-06 ENCOUNTER — Ambulatory Visit (INDEPENDENT_AMBULATORY_CARE_PROVIDER_SITE_OTHER): Payer: Federal, State, Local not specified - PPO | Admitting: Internal Medicine

## 2019-02-06 VITALS — BP 122/80 | HR 100 | Temp 97.8°F | Ht 70.0 in | Wt 150.0 lb

## 2019-02-06 DIAGNOSIS — J90 Pleural effusion, not elsewhere classified: Secondary | ICD-10-CM

## 2019-02-06 DIAGNOSIS — F17210 Nicotine dependence, cigarettes, uncomplicated: Secondary | ICD-10-CM | POA: Diagnosis not present

## 2019-02-06 DIAGNOSIS — J986 Disorders of diaphragm: Secondary | ICD-10-CM | POA: Diagnosis not present

## 2019-02-06 NOTE — Progress Notes (Signed)
lmtcb

## 2019-02-06 NOTE — Patient Instructions (Addendum)
Please remember to go to the  x-ray department  for your tests - we will call you with the results when they are available    The key is to stop smoking completely before smoking completely stops you! - For smoking cessation classes/ info  call [phone]      Pulmonary follow up is as needed  Add:  F/u  cxr in 6 weeks to close the loop completely

## 2019-02-06 NOTE — Telephone Encounter (Signed)
error 
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
## 2019-02-07 ENCOUNTER — Encounter: Payer: Self-pay | Admitting: Internal Medicine

## 2019-02-07 DIAGNOSIS — F17210 Nicotine dependence, cigarettes, uncomplicated: Secondary | ICD-10-CM | POA: Insufficient documentation

## 2019-02-07 NOTE — Assessment & Plan Note (Signed)
Counseled re importance of smoking cessation but did not meet time criteria for separate billing      Total time devoted to counseling  > 50 % of initial 60 min office visit:  review extensive inpt records   with pt/ discussion of options/alternatives/ personally creating written customized instructions  in presence of pt  then going over those specific  Instructions directly with the pt including how to use all of the meds but in particular covering each new medication in detail and the difference between the maintenance= "automatic" meds and the prns using an action plan format for the latter (If this problem/symptom => do that organization reading Left to right).  Please see AVS from this visit for a full list of these instructions which I personally wrote for this pt and  are unique to this visit.

## 2019-02-07 NOTE — Assessment & Plan Note (Signed)
Assoc nec pancreatitis see admit 12/27/2018 - u/s thora 01/08/19  X 640 cc prot 3.1/ ldh 496 wbc 2731 84% N, glucose 95 and amylase 24 - u/s thora 01/16/19 x 1.3 liters prot 3.9/ lh4 494/ glucose 107 , amylase 43, wbc 2,289 with N 79%  - cxr 02/06/2019 minimal residual effusion    Exudative effusion assoc with pancreatitis s fistula /empyema or any other serious  Pleural complication and the underlying dz has improved so prognosis is quite good as this is all c/w sympathetic effusion.  rec final f/u in 6 weeks  With cxr    Discussed in detail all the  indications, usual  risks and alternatives  relative to the benefits with patient who agrees to proceed with conservative f/u as outlined

## 2019-02-10 NOTE — Progress Notes (Signed)
Spoke with pt and notified of results per Dr. Wert. Pt verbalized understanding and denied any questions. 

## 2019-02-12 ENCOUNTER — Ambulatory Visit (INDEPENDENT_AMBULATORY_CARE_PROVIDER_SITE_OTHER): Payer: Federal, State, Local not specified - PPO | Admitting: Family Medicine

## 2019-02-12 ENCOUNTER — Encounter: Payer: Self-pay | Admitting: Family Medicine

## 2019-02-12 VITALS — BP 134/94 | HR 85 | Ht 70.0 in

## 2019-02-12 DIAGNOSIS — F1011 Alcohol abuse, in remission: Secondary | ICD-10-CM | POA: Diagnosis not present

## 2019-02-12 DIAGNOSIS — R1084 Generalized abdominal pain: Secondary | ICD-10-CM

## 2019-02-12 DIAGNOSIS — K863 Pseudocyst of pancreas: Secondary | ICD-10-CM | POA: Diagnosis not present

## 2019-02-12 DIAGNOSIS — R030 Elevated blood-pressure reading, without diagnosis of hypertension: Secondary | ICD-10-CM | POA: Diagnosis not present

## 2019-02-12 NOTE — Progress Notes (Signed)
Virtual Visit via Video Note  I connected with Charles Dougherty on 02/12/19 at  9:00 AM EDT by a video enabled telemedicine application and verified that I am speaking with the correct person using two identifiers. Location patient: home Location provider: home office Persons participating in the virtual visit: patient, provider  I discussed the limitations of evaluation and management by telemedicine and the availability of in person appointments. The patient expressed understanding and agreed to proceed.  Chief Complaint  Patient presents with  . Follow-up    HTN pt reported BP readings of 153/99(93) 123/82(106) 134/94 (85)     HPI: Charles Dougherty is a 28 y.o. male to f/u on his elevated BP as well his abdominal pain and known pancreatic pseudocyst, and alcohol abuse.  Pt states his pain has improved since last week. He is taking tramadol 50mg  1-2x/day. Pain is mostly in the AM. He is eating regular meals, having daily BM. He is walking his dog daily and I snot limited by physical symptoms. He has f/u with GI on 02/25/19 re: pancreatic pseudocyst. Pt has not consumed any alcohol since prior to his hospital admission. There is no alcohol in his home and girlfriend is very supportive.  Pt checked his BP 3 times since last week with the following readings - 153/99, 123/82, and most recently yesterday 134/94. He denies vision changes, HA, dizziness, n/v, CP, SOB, LE edema. He has been eating 3-6 small meals per day, mainly fruits, veggies, protein, smoothies.    Past Medical History:  Diagnosis Date  . Allergy   . Asthma   . Atopic eczema   . Cystitis 12/28/2018  . Drug abuse (HCC) 12/28/2018  . ETOH abuse 12/28/2018    No past surgical history on file.  Family History  Problem Relation Age of Onset  . Hypertension Mother   . Hyperlipidemia Mother   . Nephrolithiasis Father   . GER disease Brother     Social History   Tobacco Use  . Smoking status: Current Every Day Smoker   Packs/day: 1.00    Years: 5.00    Pack years: 5.00    Types: Cigarettes  . Smokeless tobacco: Never Used  . Tobacco comment: currently 4 cigs per day 02/06/2019//lmr  Substance Use Topics  . Alcohol use: Not Currently    Comment: weekly  . Drug use: Yes    Types: Marijuana     Current Outpatient Medications:  .  calcium carbonate (TUMS - DOSED IN MG ELEMENTAL CALCIUM) 500 MG chewable tablet, Chew 1 tablet by mouth as needed for indigestion or heartburn., Disp: , Rfl:  .  famotidine-calcium carbonate-magnesium hydroxide (PEPCID COMPLETE) 10-800-165 MG chewable tablet, Chew 1 tablet by mouth once., Disp: , Rfl:  .  fenofibrate 160 MG tablet, Take 1 tablet (160 mg total) by mouth daily., Disp: 90 tablet, Rfl: 3 .  metoCLOPramide (REGLAN) 5 MG tablet, Take 1 tablet (5 mg total) by mouth 4 (four) times daily -  before meals and at bedtime. for 2 weeks, Disp: 60 tablet, Rfl: 0 .  omeprazole (PRILOSEC OTC) 20 MG tablet, Take 20 mg by mouth daily. , Disp: , Rfl:  .  Probiotic Product (PROBIOTIC DAILY) CAPS, Take 1 capsule by mouth once., Disp: , Rfl:  .  traMADol (ULTRAM) 50 MG tablet, 1-2 tabs po q6 PRN pain, Disp: 28 tablet, Rfl: 0  Allergies  Allergen Reactions  . Shellfish-Derived Products Nausea And Vomiting    *Mussels*      ROS: See pertinent  positives and negatives per HPI.   EXAM:  VITALS per patient if applicable:  GENERAL: alert, oriented, appears well and in no acute distress  HEENT: atraumatic, conjunctiva clear, no obvious abnormalities on inspection of external nose and ears  NECK: normal movements of the head and neck  LUNGS: on inspection no signs of respiratory distress, breathing rate appears normal, no obvious gross SOB, gasping or wheezing, no conversational dyspnea  CV: no obvious cyanosis  MS: moves all visible extremities without noticeable abnormality  PSYCH/NEURO: pleasant and cooperative, no obvious depression or anxiety, speech and thought  processing grossly intact   ASSESSMENT AND PLAN:  1. Elevated BP without diagnosis of hypertension - BP readings improving and average >140/>90 - advised pt to continue to check BP 3x/wk and he will f/u in 3-4 wks  2. Generalized abdominal pain 3. Pancreatic pseudocyst - pain continues to improve and pt is eating well, having regular BM - pt has GI f/u on 5/12  4. Alcohol abuse, in remission - congratulated pt on his continued sobriety and encouraged him to continue on this path - also encouraged him to establish with AA or similar in order to help him maintain his sobreity    I discussed the assessment and treatment plan with the patient. The patient was provided an opportunity to ask questions and all were answered. The patient agreed with the plan and demonstrated an understanding of the instructions.   The patient was advised to call back or seek an in-person evaluation if the symptoms worsen or if the condition fails to improve as anticipated.   Luana Shu, DO

## 2019-02-17 ENCOUNTER — Other Ambulatory Visit: Payer: Self-pay

## 2019-02-17 DIAGNOSIS — K862 Cyst of pancreas: Secondary | ICD-10-CM

## 2019-02-17 NOTE — Progress Notes (Signed)
You have [facility] [place] [address] [address]   You are scheduled on 02/19/2019 at 10:00am. You should arrive 15 minutes prior to your appointment time for registration. Please follow the written instructions below on the day of your exam:  WARNING: IF YOU ARE ALLERGIC TO IODINE/X-RAY DYE, PLEASE NOTIFY RADIOLOGY IMMEDIATELY AT [phone]! YOU WILL BE GIVEN A 13 HOUR PREMEDICATION PREP.  1) Do not eat or drink anything after 6:00am (4 hours prior to your test) 2) You have been given 2 bottles of oral contrast to drink. The solution may taste better if refrigerated, but do NOT add ice or any other liquid to this solution. Shake well before drinking.    Drink 1 bottle of contrast @ 8:00am (2 hours prior to your exam)  Drink 1 bottle of contrast @ 9:00am (1 hour prior to your exam)  You may take any medications as prescribed with a small amount of water, if necessary. If you take any of the following medications: METFORMIN, GLUCOPHAGE, GLUCOVANCE, AVANDAMET, RIOMET, FORTAMET, [place] MET, JANUMET, GLUMETZA or METAGLIP, you MAY be asked to HOLD this medication 48 hours AFTER the exam.  The purpose of you drinking the oral contrast is to aid in the visualization of your intestinal tract. The contrast solution may cause some diarrhea. Depending on your individual set of symptoms, you may also receive an intravenous injection of x-ray contrast/dye. Plan on being at [facility] for 30 minutes or longer, depending on the type of exam you are having performed.  This test typically takes 30-45 minutes to complete.  If you have any questions regarding your exam or if you need to reschedule, you may call the CT department at [phone] between the hours of 8:00 am and 5:00 pm, Monday-Friday.  ________________________________________________________________________   Patient aware of CT  and verbalized understanding of instructions.

## 2019-02-19 ENCOUNTER — Other Ambulatory Visit: Payer: Self-pay

## 2019-02-19 ENCOUNTER — Ambulatory Visit (HOSPITAL_BASED_OUTPATIENT_CLINIC_OR_DEPARTMENT_OTHER)
Admission: RE | Admit: 2019-02-19 | Discharge: 2019-02-19 | Disposition: A | Discharge status: Home / Self Care | Payer: Federal, State, Local not specified - PPO | Source: Ambulatory Visit | Attending: Gastroenterology | Admitting: Gastroenterology

## 2019-02-19 ENCOUNTER — Encounter (HOSPITAL_BASED_OUTPATIENT_CLINIC_OR_DEPARTMENT_OTHER): Payer: Self-pay

## 2019-02-19 DIAGNOSIS — R188 Other ascites: Secondary | ICD-10-CM | POA: Diagnosis not present

## 2019-02-19 DIAGNOSIS — K862 Cyst of pancreas: Secondary | ICD-10-CM | POA: Diagnosis not present

## 2019-02-19 DIAGNOSIS — K859 Acute pancreatitis without necrosis or infection, unspecified: Secondary | ICD-10-CM | POA: Diagnosis not present

## 2019-02-19 DIAGNOSIS — K8689 Other specified diseases of pancreas: Secondary | ICD-10-CM | POA: Diagnosis not present

## 2019-02-19 MED ORDER — IOHEXOL 300 MG/ML  SOLN
100.0000 mL | Freq: Once | INTRAMUSCULAR | Status: AC | PRN
  Administered 2019-02-19: 10:00:00 100 mL via INTRAVENOUS

## 2019-02-20 ENCOUNTER — Telehealth: Payer: Self-pay | Admitting: Gastroenterology

## 2019-02-20 NOTE — Telephone Encounter (Signed)
Pt aware Dr C has not reviewed CT and we will contact him as soon as reviewed

## 2019-02-25 ENCOUNTER — Other Ambulatory Visit: Payer: Self-pay

## 2019-02-25 ENCOUNTER — Telehealth (INDEPENDENT_AMBULATORY_CARE_PROVIDER_SITE_OTHER): Payer: Federal, State, Local not specified - PPO | Admitting: Gastroenterology

## 2019-02-25 ENCOUNTER — Encounter: Payer: Self-pay | Admitting: Gastroenterology

## 2019-02-25 VITALS — Ht 70.0 in | Wt 155.0 lb

## 2019-02-25 DIAGNOSIS — K852 Alcohol induced acute pancreatitis without necrosis or infection: Secondary | ICD-10-CM | POA: Diagnosis not present

## 2019-02-25 DIAGNOSIS — F17210 Nicotine dependence, cigarettes, uncomplicated: Secondary | ICD-10-CM | POA: Diagnosis not present

## 2019-02-25 DIAGNOSIS — K863 Pseudocyst of pancreas: Secondary | ICD-10-CM

## 2019-02-25 DIAGNOSIS — K219 Gastro-esophageal reflux disease without esophagitis: Secondary | ICD-10-CM

## 2019-02-25 DIAGNOSIS — Z87898 Personal history of other specified conditions: Secondary | ICD-10-CM

## 2019-02-25 DIAGNOSIS — Z8719 Personal history of other diseases of the digestive system: Secondary | ICD-10-CM

## 2019-02-25 DIAGNOSIS — Z720 Tobacco use: Secondary | ICD-10-CM

## 2019-02-25 DIAGNOSIS — E781 Pure hyperglyceridemia: Secondary | ICD-10-CM

## 2019-02-25 NOTE — Patient Instructions (Addendum)
To help prevent the possible spread of infection to our patients, communities, and staff; we will be implementing the following measures:  As of now we are not allowing any visitors/family members to accompany you to any upcoming appointments with [facility]. If you have any concerns about this please contact our office to discuss prior to the appointment.  Please call to schedule lab appointment, and CT appointment in 3 months before schedule office visit.  Please schedule office visit on 3 to 6 months

## 2019-02-25 NOTE — Progress Notes (Signed)
Chief Complaint: Pancreatic pseudocyst  Referring Provider:     Overton Mamirigliano, Mary K, DO  GI history: 28 year old male hospitalized 12/27/2018-01/19/2019 with acute necrotizing pancreatitis 2/2 alcohol abuse and hypertriglyceridemia with TG >5000 on admission. Pancreatitis complicated by left exudative pleural effusion (thoracentesis x2, antibiotics), prolonged small bowel ileus, conservatively managed with NG tube decompression, prolonged n.p.o.,  TPN, treated with insulin gtt. and aggressive electrolyte repletion.  CT on 3/28 with severe pancreatitis and extensive pseudocyst formation through the retroperitoneum and upper abdomen.  Additionally with anemia requiring 2U PRBCs with consideration for EGD as an outpatient.  Last evaluated via virtual appointment on 01/23/2019, with improved p.o. intake, exercise tolerance, activity at that time.  No alcohol or drug use since hospital discharge.     HPI:    Due to current restrictions/limitations of in-office visits due to the COVID-19 pandemic, this scheduled clinical appointment was converted to a telehealth virtual consultation.  Interactive audio and video telecommunications were attempted between this provider and patient, however failed, due to patient having technical difficulties OR patient did not have access to video capability. We continued and completed visit with audio only.  -Time of medical discussion: 25 minutes -The patient did consent to this virtual visit and is aware of possible charges through their insurance for this visit.  -Names of all parties present: Charles Dougherty (patient), Doristine LocksVito Majestic Brister, DO, [facility] (physician) -Patient location: Home -Physician location: Office  Charles OverlieZachary Dougherty is a 28 y.o. male referred to the Gastroenterology Clinic for routine follow-up.  He was last evaluated by me via virtual health appointment on 01/23/2019.  Was evaluated with abdominal ultrasound, which was only notable for large  pancreatic pseudocyst, but not ascites amenable to paracentesis or warranting diuretics.  Since then, has established care with his new PCM, Dr. Luana ShuMary Tayona Sarnowski.  Has weaned off Vicodin for abdominal pain, now controlled with tramadol.  He was also referred to [facility], seen by Dr. Sherene SiresWert on 4/24 for his exudative effusion noted on recent admission.  Repeat CT on 02/19/2019 demonstrated large, multiloculated pancreatic pseudocyst, but decreased in size, measuring 19 x 6.4 cm (from 26.9 x 9.7 cm) and resolution of the previously noted pelvic ascites.  Resolution of acute pancreatitis and pleural effusions also noted.  I reviewed that CT with the patient today.  No new labs since last evaluation.   States the pain is essentially resolved. Some mild pain post prandial depending on types of food.  Normal bowel habits.  No nausea, vomiting.  Has changed to taking taking Tums, Pepcid, Prilosec to all prn. Rare reflux sxs, mainly characterized as belching. Weight stable. Abdomen has decreased in size.  Continues to walk frequently around neighborhood.  Has remained completely abstinent of alcohol and illicit drug use since hospital discharge.  Continues to wean tobacco, down to 1-2 cigarettes/day.  Past medical history, past surgical history, social history, family history, medications, and allergies reviewed in the chart and with patient.  Past Medical History:  Diagnosis Date  . Allergy   . Asthma   . Atopic eczema   . Cystitis 12/28/2018  . Drug abuse (HCC) 12/28/2018  . ETOH abuse 12/28/2018  . GERD (gastroesophageal reflux disease)   . Hyperlipidemia      History reviewed. No pertinent surgical history. Family History  Problem Relation Age of Onset  . Hypertension Mother   . Hyperlipidemia Mother   . Nephrolithiasis Father   . GER disease Brother   .  Colon cancer Neg Hx   . Esophageal cancer Neg Hx    Social History   Tobacco Use  . Smoking status: Current Every Day  Smoker    Packs/day: 1.00    Years: 5.00    Pack years: 5.00    Types: Cigarettes  . Smokeless tobacco: Never Used  . Tobacco comment: currently 1-2 cigs per day   Substance Use Topics  . Alcohol use: Not Currently  . Drug use: Yes    Types: Marijuana    Comment: sometimes   Current Outpatient Medications  Medication Sig Dispense Refill  . calcium carbonate (TUMS - DOSED IN MG ELEMENTAL CALCIUM) 500 MG chewable tablet Chew 1 tablet by mouth as needed for indigestion or heartburn.    . famotidine-calcium carbonate-magnesium hydroxide (PEPCID COMPLETE) 10-800-165 MG chewable tablet Chew 1 tablet by mouth once.    Marland Kitchen omeprazole (PRILOSEC OTC) 20 MG tablet Take 20 mg by mouth daily.     . Probiotic Product (PROBIOTIC DAILY) CAPS Take 1 capsule by mouth once.    . traMADol (ULTRAM) 50 MG tablet 1-2 tabs po q6 PRN pain 28 tablet 0  . fenofibrate 160 MG tablet Take 1 tablet (160 mg total) by mouth daily. (Patient not taking: Reported on 02/25/2019) 90 tablet 3   No current facility-administered medications for this visit.    Allergies  Allergen Reactions  . Shellfish-Derived Products Nausea And Vomiting    *Mussels*     Review of Systems: All systems reviewed and negative except where noted in HPI.     Physical Exam:    Physical exam not completed due to the nature of this telehealth communication.  Patient was otherwise alert and oriented and well communicative.   ASSESSMENT AND PLAN;   1) History of acute Necrotizing Pancreatitis- resolved 2) Hypertriglyceridemia 3) Pancreatic Pseudocyst-improving 4) Small bowel ileus (2/2 Acute Pancreatitis)-resolved 5) Exudative pleural effusion 6) Deconditioning-improving 7) Tobacco use disorder 8) Alcohol abuse disorder-quit 12/2018 9) Abdominal Ascites-resolved 10) Heartburn-controlled  - Interval improvement in size of large pancreatic pseudocyst, so will plan to allow continued resorption rather than cystgastrostomy - Repeat CT  abd in 3 to 4 months to establish continued resorption or even complete resolution. - Continue antireflux lifestyle measures - If needing antacid therapy more frequently, would recommend restarting daily PPI and can again consider EGD as appropriate - Continue fiber 8 for hyper TG and follow-up with his PCM - Check labs prior to follow-up appointment to evaluate whether or not he has any impaired hepatic synthetic function - Continue exercise as tolerated - Again applauded his efforts and weaning cigarettes and congratulated him on continued abstinence of all alcohol and illicit drug use.  He is planning on attending in person AA meetings when they resume after the COVID-19 pandemic restrictions.  Otherwise cites a strong home support network with his mother and girlfriend -RTC in 3 to 6 months or sooner as needed   Shellia Cleverly, DO, FACG  02/25/2019, 9:28 AM   Overton Mam, DO

## 2019-03-12 ENCOUNTER — Encounter: Payer: Self-pay | Admitting: Family Medicine

## 2019-03-12 ENCOUNTER — Ambulatory Visit (INDEPENDENT_AMBULATORY_CARE_PROVIDER_SITE_OTHER): Payer: Federal, State, Local not specified - PPO | Admitting: Family Medicine

## 2019-03-12 DIAGNOSIS — E781 Pure hyperglyceridemia: Secondary | ICD-10-CM | POA: Diagnosis not present

## 2019-03-12 DIAGNOSIS — R030 Elevated blood-pressure reading, without diagnosis of hypertension: Secondary | ICD-10-CM

## 2019-03-12 DIAGNOSIS — F1011 Alcohol abuse, in remission: Secondary | ICD-10-CM | POA: Diagnosis not present

## 2019-03-12 MED ORDER — FENOFIBRATE 160 MG PO TABS: 160.0000 mg | ORAL_TABLET | Freq: Every day | ORAL | 3 refills | Status: DC

## 2019-03-12 NOTE — Progress Notes (Signed)
Virtual Visit via Video Note  I connected with Charles Dougherty on 03/12/19 at 11:30 AM EDT by a video enabled telemedicine application and verified that I am speaking with the correct person using two identifiers. Location patient: home Location provider: work or home office Persons participating in the virtual visit: patient, provider  I discussed the limitations of evaluation and management by telemedicine and the availability of in person appointments. The patient expressed understanding and agreed to proceed.  Chief Complaint  Patient presents with  . Follow-up    follow up BP/ 1 week or 2 since pt checked BP pt states its been good. but doesn't remember exact numbers.     HPI: Charles Dougherty is a 28 y.o. male to f/u on hypertriglyceridemia and elevated BP without a diagnosis of HTN. At last visit with me 1 mo ago, pts home Bps has improved since his hospitalizations and numbers were at goal. He was Rx'd fenofibrate at hospital discharge in 12/2018 and at last OV was taking it.  Today, pt states his BP has been "good". He hasnot checked it in 1-2 weeks and does not recall specific BP readings. Upon further questioning, pt states his BP has not been >140/>90 any time he has checked it. He is not taking fenofibrate since he ran out of med and had trouble getting refill from [facility] (Rx wasn't ready on 2-3 separate occasions when he went to pick up). His diet is good. He is walking almost daily. No issues or concerns. Pt continues to abstain from alcohol and has been sober since his hospital admission in 12/2018.  Past Medical History:  Diagnosis Date  . Allergy   . Asthma   . Atopic eczema   . Cystitis 12/28/2018  . Drug abuse (HCC) 12/28/2018  . ETOH abuse 12/28/2018  . GERD (gastroesophageal reflux disease)   . Hyperlipidemia     No past surgical history on file.  Family History  Problem Relation Age of Onset  . Hypertension Mother   . Hyperlipidemia Mother   . Nephrolithiasis Father    . GER disease Brother   . Colon cancer Neg Hx   . Esophageal cancer Neg Hx     Social History   Tobacco Use  . Smoking status: Current Every Day Smoker    Packs/day: 1.00    Years: 5.00    Pack years: 5.00    Types: Cigarettes  . Smokeless tobacco: Never Used  . Tobacco comment: currently 1-2 cigs per day   Substance Use Topics  . Alcohol use: Not Currently  . Drug use: Yes    Types: Marijuana    Comment: sometimes     Current Outpatient Medications:  .  calcium carbonate (TUMS - DOSED IN MG ELEMENTAL CALCIUM) 500 MG chewable tablet, Chew 1 tablet by mouth as needed for indigestion or heartburn., Disp: , Rfl:  .  omeprazole (PRILOSEC OTC) 20 MG tablet, Take 20 mg by mouth daily. , Disp: , Rfl:  .  Probiotic Product (PROBIOTIC DAILY) CAPS, Take 1 capsule by mouth once., Disp: , Rfl:  .  famotidine-calcium carbonate-magnesium hydroxide (PEPCID COMPLETE) 10-800-165 MG chewable tablet, Chew 1 tablet by mouth once., Disp: , Rfl:  .  fenofibrate 160 MG tablet, Take 1 tablet (160 mg total) by mouth daily. (Patient not taking: Reported on 02/25/2019), Disp: 90 tablet, Rfl: 3  Allergies  Allergen Reactions  . Shellfish-Derived Products Nausea And Vomiting    *Mussels*      ROS: See pertinent positives and  negatives per HPI.   EXAM:  VITALS per patient if applicable: There were no vitals taken for this visit.   GENERAL: alert, oriented, appears well and in no acute distress  NECK: normal movements of the head and neck  LUNGS: on inspection no signs of respiratory distress, breathing rate appears normal, no obvious gross SOB, gasping or wheezing, no conversational dyspnea  CV: no obvious cyanosis  MS: moves all visible extremities without noticeable abnormality  PSYCH/NEURO: pleasant and cooperative, speech and thought processing grossly intact   ASSESSMENT AND PLAN:  1. Elevated BP without diagnosis of hypertension - home BP readings WNL - issue resolved and no  further eval or follow-up needed at this time  2. Alcohol abuse, in remission - sober since 12/2018 - congratulated pt on this! Encouraged him to join AA or other program to help him in this recovery process   4. Hypertriglyceridemia - will plan to recheck FLP in 3 mo at CPE. If TG at goal, will likely d/c med Refill: - fenofibrate 160 MG tablet; Take 1 tablet (160 mg total) by mouth daily.  Dispense: 90 tablet; Refill: 3  Pt to f/u in 3 mo for CPE, fasting labs    I discussed the assessment and treatment plan with the patient. The patient was provided an opportunity to ask questions and all were answered. The patient agreed with the plan and demonstrated an understanding of the instructions.   The patient was advised to call back or seek an in-person evaluation if the symptoms worsen or if the condition fails to improve as anticipated.   Luana ShuMary , DO

## 2019-03-14 ENCOUNTER — Other Ambulatory Visit: Payer: Self-pay | Admitting: Internal Medicine

## 2019-03-14 ENCOUNTER — Ambulatory Visit (INDEPENDENT_AMBULATORY_CARE_PROVIDER_SITE_OTHER): Payer: Federal, State, Local not specified - PPO

## 2019-03-14 ENCOUNTER — Encounter: Payer: Self-pay | Admitting: Internal Medicine

## 2019-03-14 ENCOUNTER — Ambulatory Visit (INDEPENDENT_AMBULATORY_CARE_PROVIDER_SITE_OTHER): Payer: Federal, State, Local not specified - PPO | Admitting: Internal Medicine

## 2019-03-14 ENCOUNTER — Other Ambulatory Visit: Payer: Self-pay

## 2019-03-14 VITALS — BP 118/76 | HR 85 | Temp 98.4°F | Ht 70.0 in | Wt 152.0 lb

## 2019-03-14 DIAGNOSIS — J90 Pleural effusion, not elsewhere classified: Secondary | ICD-10-CM

## 2019-03-14 DIAGNOSIS — F17210 Nicotine dependence, cigarettes, uncomplicated: Secondary | ICD-10-CM | POA: Diagnosis not present

## 2019-03-14 DIAGNOSIS — J9811 Atelectasis: Secondary | ICD-10-CM | POA: Diagnosis not present

## 2019-03-14 NOTE — Patient Instructions (Signed)
No pulmonary follow up needed - call for any new symptoms

## 2019-03-14 NOTE — Assessment & Plan Note (Signed)
Counseled re importance of smoking cessation but did not meet time criteria for separate billing    Only smoking one a day so strongly advised follow thru with full cessation, f/u PCP  Pulmonary f/u is prn
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
## 2019-03-14 NOTE — Assessment & Plan Note (Addendum)
Assoc nec pancreatitis see admit 12/27/2018 - u/s thora 01/08/19  X 640 cc prot 3.1/ ldh 496 wbc 2731 84% N, glucose 95 and amylase 24 - u/s thora 01/16/19 x 1.3 liters prot 3.9/ lh4 494/ glucose 107 , amylase 43, wbc 2,289 with N 79%  - cxr 02/06/2019 minimal residual effusion   - ABD CT 02/19/2019 no significant effusion   Baseline cxr now shows some residual atx/scarring but clearly no effusion and suspect there will always be some residual change at the left base so rec always seek canopy-capable imaging if possible if any cxr's needed in future for any reason.

## 2019-05-12 IMAGING — CT CT ABDOMEN AND PELVIS WITH CONTRAST
2 of 4 series · 15 of 46 positions shown, 17 images · IV contrast (APPLIED)
Comparison: 01/10/2019

CLINICAL DATA: Followup necrotizing pancreatitis and pancreatic
pseudocysts.

EXAM:
CT ABDOMEN AND PELVIS WITH CONTRAST
TECHNIQUE: Multidetector CT imaging of the abdomen and pelvis was performed
using the standard protocol following bolus administration of
intravenous contrast.
CONTRAST:  100mL OMNIPAQUE IOHEXOL 300 MG/ML  SOLN

[Series 2: axial st · axial · 0.78mm/px · z∈[-567,-52]mm · 12 of 113 slices shown, 14 images]
[im 5/113  soft-tissue]
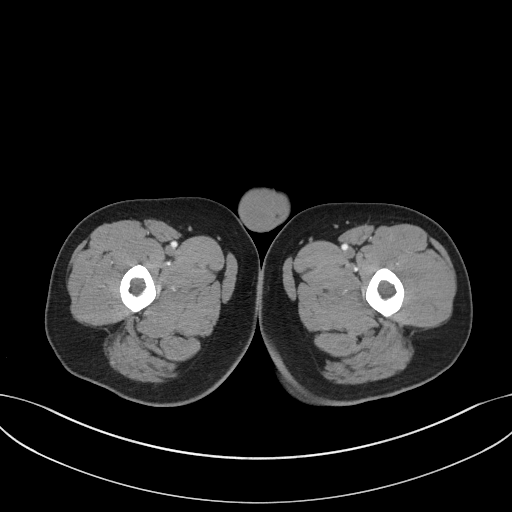
[im 5/113  bone]
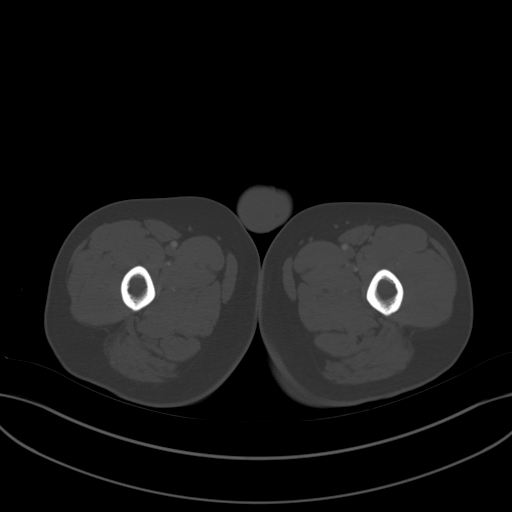
[im 15/113  soft-tissue]
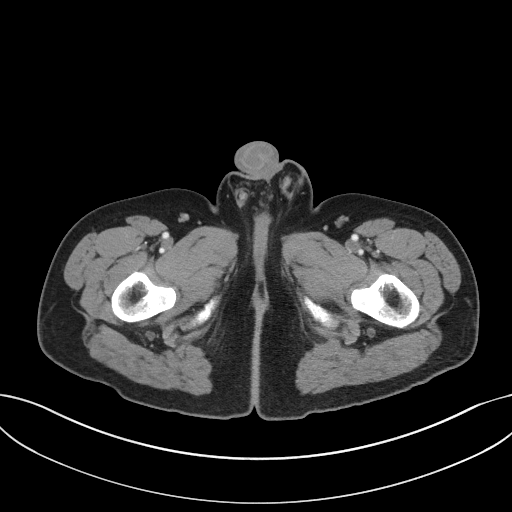
[im 24/113  soft-tissue]
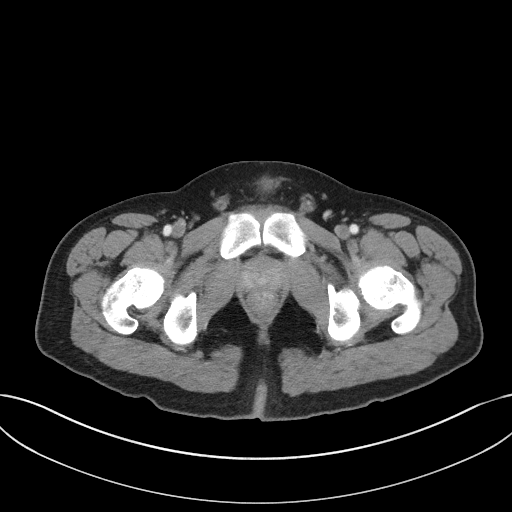
[im 33/113  soft-tissue]
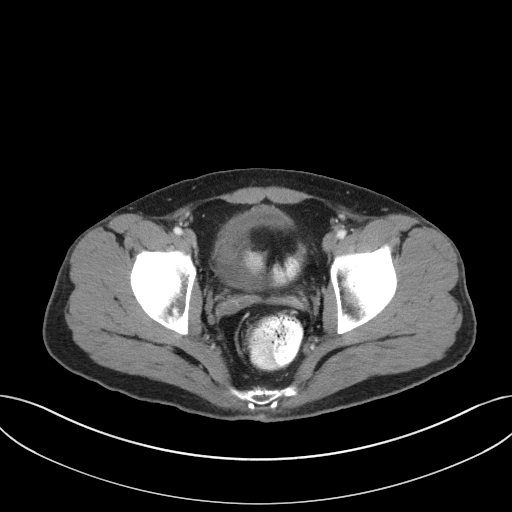
[im 43/113  soft-tissue]
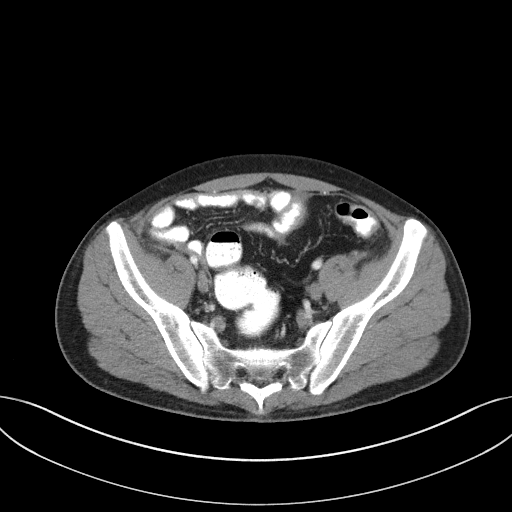
[im 52/113  soft-tissue]
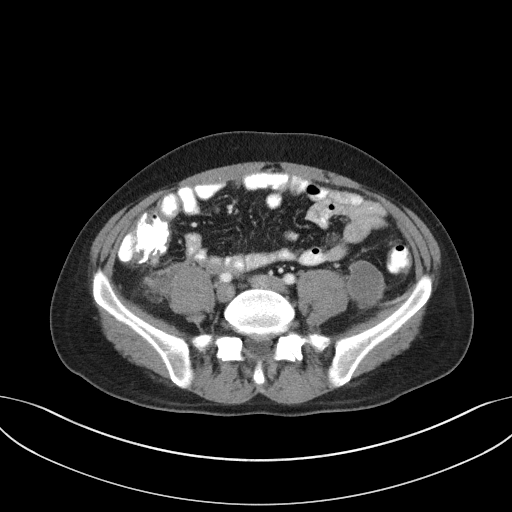
[im 61/113  soft-tissue]
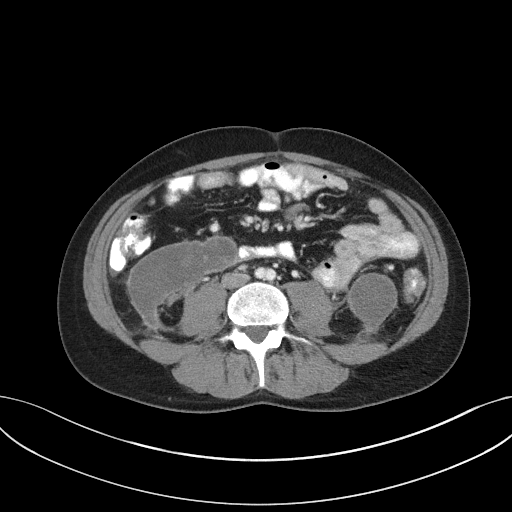
[im 71/113  soft-tissue]
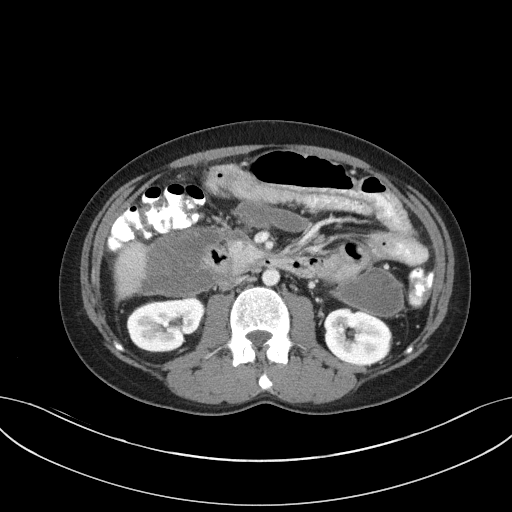
[im 80/113  soft-tissue]
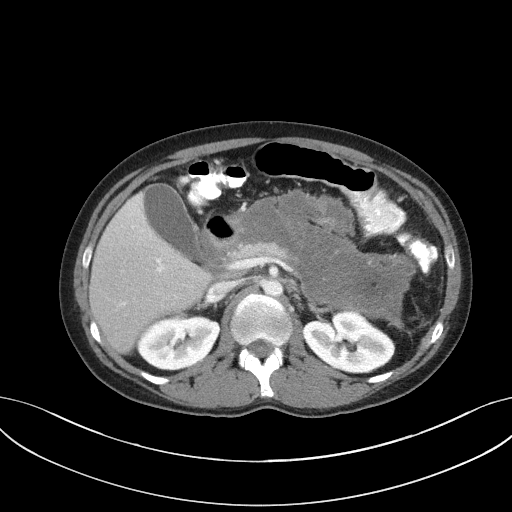
[im 80/113  bone]
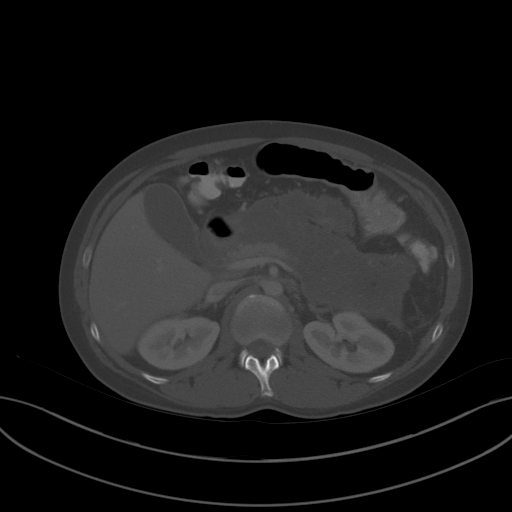
[im 89/113  soft-tissue]
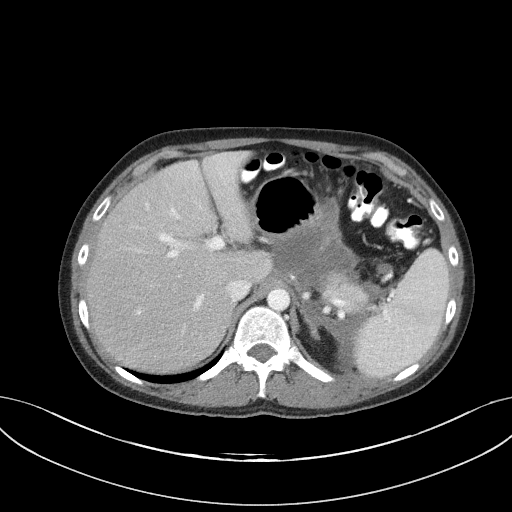
[im 99/113  soft-tissue]
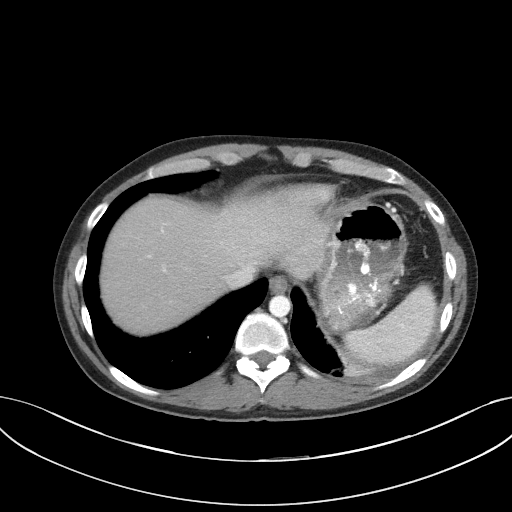
[im 108/113  soft-tissue]
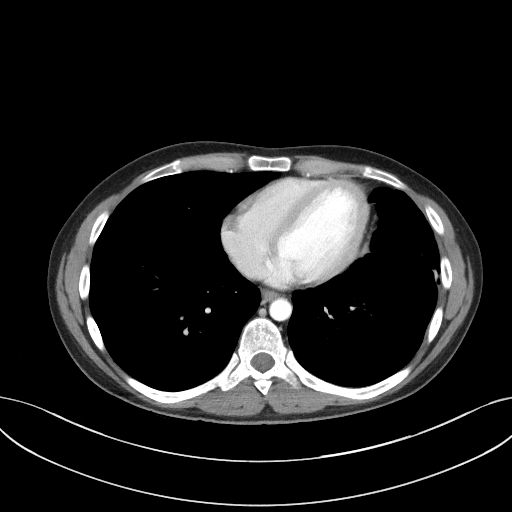

[Series 5: coronal st · coronal · 0.79mm/px · 3 of 82 slices shown]
[im 28/82  soft-tissue]
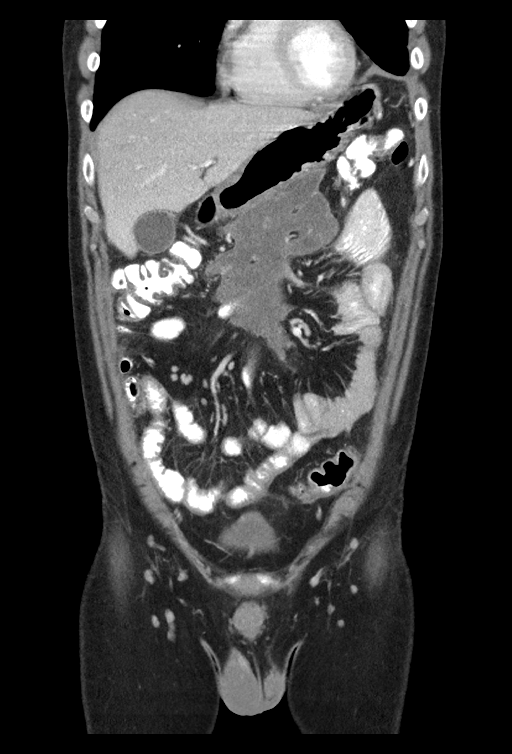
[im 37/82  soft-tissue]
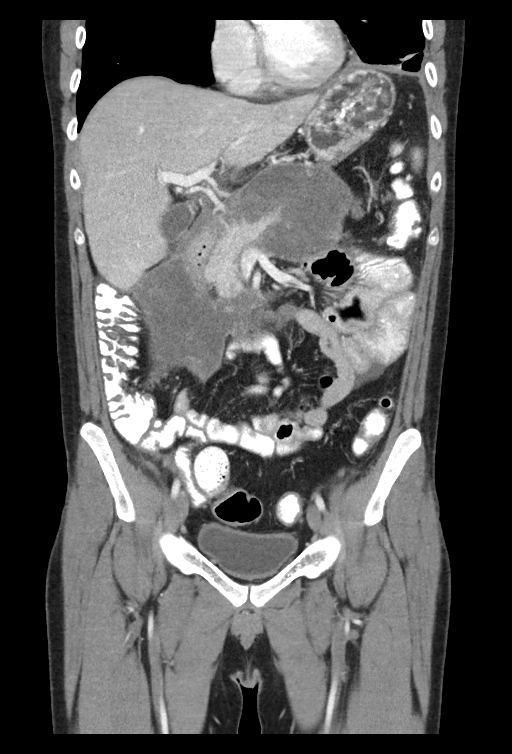
[im 46/82  soft-tissue]
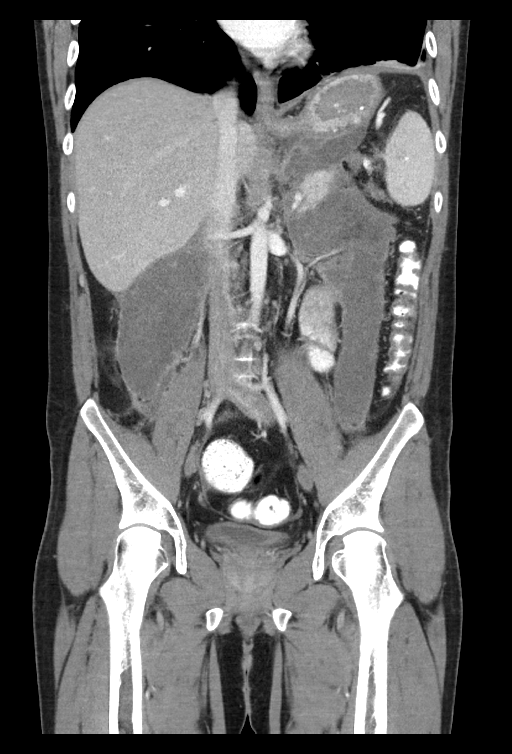

[15 of 46 positions shown; findings below may reference images not displayed]

FINDINGS: Lower Chest: No acute findings. Previously seen bilateral pleural
effusions have resolved since prior exam.

Hepatobiliary: No hepatic masses identified. Gallbladder is
unremarkable. No evidence of biliary ductal dilatation.

Pancreas: There has been resolution of pancreatic edema since prior
study. Large multiloculated fluid collection extending throughout
peripancreatic and anterior pararenal spaces has decreased in size
since previous study. This measures approximately 19.0 by 6.4 cm in
greatest dimensions compared to 26.9 by 9.7 cm on previous study.
Previously seen ascites in the lower abdomen pelvis is resolved.

Spleen: Within normal limits in size and appearance.

Adrenals/Urinary Tract: No masses identified. No evidence of
hydronephrosis. Unremarkable unopacified urinary bladder.

Stomach/Bowel: No evidence of obstruction, inflammatory process or
abnormal fluid collections.

Vascular/Lymphatic: No pathologically enlarged lymph nodes. No
abdominal aortic aneurysm.

Reproductive:  No mass or other significant abnormality.

Other:  None.

Musculoskeletal:  No suspicious bone lesions identified.
IMPRESSION: 1. Resolution of acute pancreatitis and pleural effusions since
previous study.
2. Decreased size of large multiloculated peripancreatic pseudocyst,
and resolution of ascites, since prior exam.

## 2019-05-22 ENCOUNTER — Telehealth: Payer: Self-pay

## 2019-05-22 NOTE — Telephone Encounter (Signed)
Left message for patient to call back to the office to schedule repeat lab work and CT abd/pel from 02/25/2019 as follow up;

## 2019-05-26 NOTE — Telephone Encounter (Signed)
Repeat=CT ABDOMEN AND PELVIS WITH CONTRAST=Followup necrotizing pancreatitis and pancreatic pseudocysts.

## 2019-06-02 NOTE — Telephone Encounter (Signed)
Left message for patient to call back  

## 2019-06-05 NOTE — Telephone Encounter (Signed)
Left message for patient to call back to the office;  

## 2019-06-10 NOTE — Telephone Encounter (Signed)
As patient has not returned call to the office-Patient has been added to back log for recall for labs and CT scan;

## 2019-06-10 NOTE — Telephone Encounter (Signed)
FYI

## 2019-06-27 ENCOUNTER — Other Ambulatory Visit: Payer: Self-pay | Admitting: Family Medicine

## 2019-06-27 DIAGNOSIS — E781 Pure hyperglyceridemia: Secondary | ICD-10-CM

## 2019-07-02 ENCOUNTER — Telehealth: Payer: Self-pay

## 2019-07-02 NOTE — Telephone Encounter (Signed)
Called patient to schedule repeat CT and labs from Office visit in 02/2019. Patient states he does not have insurance at the moment and does not want to schedule. He hopes to have a job soon with benefits. I spoke to the patient about [facility] offering financial assistance. Patient states he will look into it and give [place] a call back if he decides to schedule. Patient had no complaints, stating he is doing very well.

## 2019-07-03 NOTE — Telephone Encounter (Signed)
Understood. Thanks for the update.  

## 2022-12-17 ENCOUNTER — Emergency Department (HOSPITAL_BASED_OUTPATIENT_CLINIC_OR_DEPARTMENT_OTHER)
Admission: EM | Admit: 2022-12-17 | Discharge: 2022-12-17 | Disposition: A | Discharge status: Home / Self Care | Payer: Federal, State, Local not specified - PPO | Attending: Emergency Medicine | Admitting: Emergency Medicine

## 2022-12-17 ENCOUNTER — Other Ambulatory Visit: Payer: Self-pay

## 2022-12-17 ENCOUNTER — Encounter (HOSPITAL_BASED_OUTPATIENT_CLINIC_OR_DEPARTMENT_OTHER): Payer: Self-pay

## 2022-12-17 DIAGNOSIS — L02214 Cutaneous abscess of groin: Secondary | ICD-10-CM | POA: Insufficient documentation

## 2022-12-17 HISTORY — DX: Acute pancreatitis without necrosis or infection, unspecified: K85.90

## 2022-12-17 MED ORDER — DOXYCYCLINE HYCLATE 100 MG PO CAPS: 100.0000 mg | ORAL_CAPSULE | Freq: Two times a day (BID) | ORAL | 0 refills | Status: AC

## 2022-12-17 NOTE — ED Notes (Signed)
He describes an abscess just post. To right testicle. He states he had a similar episode two years ago with this "in the same spot; but it got better on it's own". He denies fever, nor any other sign of current illness.

## 2022-12-17 NOTE — ED Provider Notes (Signed)
[facility] Provider Note   CSN: [id number] Arrival date & time: 12/17/22  1431     History  Chief Complaint  Patient presents with   Abscess    Charles Dougherty is a 32 y.o. male.  Patient complains of pain in the right groin area.  Patient reports he has had a swollen area.  Patient reports he has been soaking this area and salt water for the past 3 days.  Patient reports he has had an abscess in the same area in the past.  Patient reports he has past medical history of hypertension and pancreatitis patient reports he stopped drinking and changed his diet and has not had any problems.  Patient reports allergies to shellfish.  He has not had any fever or chills.  Patient denies any pain in the testicular area he denies any back or abdominal pain  The history is provided by the patient. No language interpreter was used.  Abscess Location:  Leg Leg abscess location:  R upper leg Size:  Who Abscess quality: draining   Red streaking: no   Duration:  3 days Progression:  Worsening Chronicity:  New Relieved by:  Nothing Worsened by:  Nothing Ineffective treatments:  None tried Associated symptoms: no fatigue and no fever        Home Medications Prior to Admission medications   Medication Sig Start Date End Date Taking? Authorizing Provider  doxycycline (VIBRAMYCIN) 100 MG capsule Take 1 capsule (100 mg total) by mouth 2 (two) times daily. 12/17/22  Yes Caryl Ada K, PA-C  calcium carbonate (TUMS - DOSED IN MG ELEMENTAL CALCIUM) 500 MG chewable tablet Chew 1 tablet by mouth as needed for indigestion or heartburn.    [provider]  famotidine-calcium carbonate-magnesium hydroxide (PEPCID COMPLETE) 10-800-165 MG chewable tablet Chew 1 tablet by mouth once.    [provider]  fenofibrate 160 MG tablet TAKE 1 TABLET BY MOUTH EVERY DAY 06/30/19   Cirigliano, Mary K, DO  omeprazole (PRILOSEC OTC) 20 MG tablet Take 20 mg by  mouth daily.    [provider]  Probiotic Product (PROBIOTIC DAILY) CAPS Take 1 capsule by mouth once.    [provider]      Allergies    Shellfish-derived products and Cashew nut oil    Review of Systems   Review of Systems  Constitutional:  Negative for fatigue and fever.  All other systems reviewed and are negative.   Physical Exam Updated Vital Signs BP (!) 149/91 (BP Location: Right Arm)   Pulse 75   Temp 97.9 F (36.6 C) (Oral)   Resp 17   Ht '5\' 10"'$  (1.778 m)   Wt 61.7 kg   SpO2 100%   BMI 19.51 kg/m  Physical Exam Vitals and nursing note reviewed.  Constitutional:      Appearance: He is well-developed.  HENT:     Head: Normocephalic.  Pulmonary:     Effort: Pulmonary effort is normal.  Abdominal:     General: There is no distension.  Musculoskeletal:        General: Normal range of motion.     Comments: 2 cm draining abscess right groin/inguinal area no testicular swelling no redness no streaking  Neurological:     Mental Status: He is alert and oriented to person, place, and time.  Psychiatric:        Mood and Affect: Mood normal.     ED Results / Procedures / Treatments  Labs (all labs ordered are listed, but only abnormal results are displayed) Labs Reviewed - No data to display  EKG None  Radiology No results found.  Procedures Procedures    Medications Ordered in ED Medications - No data to display  ED Course/ Medical Decision Making/ A&P                             Medical Decision Making Patient complains of an abscess on his right inner thigh  Risk Prescription drug management. Risk Details: Patient has a draining abscess right inguinal area of his right leg patient is given a prescription for doxycycline he is advised to continue soaking area he is to return to the emergency department if any problems           Final Clinical Impression(s) / ED Diagnoses Final diagnoses:  Abscess of right groin     Rx / DC Orders ED Discharge Orders          Ordered    doxycycline (VIBRAMYCIN) 100 MG capsule  2 times daily        12/17/22 1530           An After Visit Summary was printed and given to the patient.    Fransico Meadow, [place] 12/17/22 [place], MD 12/17/22 1630

## 2022-12-17 NOTE — Discharge Instructions (Addendum)
Soak area 20 minutes 4 times a day.  Return if any problems.

## 2022-12-17 NOTE — ED Triage Notes (Signed)
In for eval of abscess to right groin/perineal onset 12/14/22. Also right ear muffled.
